# Patient Record
Sex: Male | Born: 1975 | Race: White | Hispanic: No | Marital: Married | Smoking: Former smoker
Health system: Southern US, Community
[De-identification: ages and names within clinical notes are randomized; demographics above are authoritative.]

## PROBLEM LIST (undated history)

## (undated) DIAGNOSIS — R55 Syncope and collapse: Secondary | ICD-10-CM

## (undated) DIAGNOSIS — R079 Chest pain, unspecified: Secondary | ICD-10-CM

## (undated) DIAGNOSIS — I10 Essential (primary) hypertension: Secondary | ICD-10-CM

## (undated) DIAGNOSIS — J189 Pneumonia, unspecified organism: Secondary | ICD-10-CM

## (undated) DIAGNOSIS — I469 Cardiac arrest, cause unspecified: Secondary | ICD-10-CM

## (undated) DIAGNOSIS — E669 Obesity, unspecified: Secondary | ICD-10-CM

## (undated) DIAGNOSIS — I499 Cardiac arrhythmia, unspecified: Secondary | ICD-10-CM

## (undated) DIAGNOSIS — E079 Disorder of thyroid, unspecified: Secondary | ICD-10-CM

## (undated) DIAGNOSIS — G4730 Sleep apnea, unspecified: Secondary | ICD-10-CM

## (undated) DIAGNOSIS — E785 Hyperlipidemia, unspecified: Secondary | ICD-10-CM

## (undated) DIAGNOSIS — S0300XA Dislocation of jaw, unspecified side, initial encounter: Secondary | ICD-10-CM

## (undated) HISTORY — DX: Syncope and collapse: R55

## (undated) HISTORY — PX: CARDIAC CATHETERIZATION: SHX172

## (undated) HISTORY — PX: HAND SURGERY: SHX662

## (undated) HISTORY — DX: Hyperlipidemia, unspecified: E78.5

## (undated) HISTORY — DX: Chest pain, unspecified: R07.9

## (undated) HISTORY — DX: Dislocation of jaw, unspecified side, initial encounter: S03.00XA

## (undated) HISTORY — DX: Obesity, unspecified: E66.9

---

## 1997-09-04 ENCOUNTER — Emergency Department (HOSPITAL_COMMUNITY): Admission: EM | Admit: 1997-09-04 | Discharge: 1997-09-04 | Payer: Self-pay | Admitting: *Deleted

## 2002-03-26 ENCOUNTER — Ambulatory Visit (HOSPITAL_COMMUNITY): Admission: RE | Admit: 2002-03-26 | Discharge: 2002-03-26 | Payer: Self-pay | Admitting: Internal Medicine

## 2002-03-26 ENCOUNTER — Encounter (INDEPENDENT_AMBULATORY_CARE_PROVIDER_SITE_OTHER): Payer: Self-pay | Admitting: Internal Medicine

## 2006-08-15 ENCOUNTER — Ambulatory Visit: Payer: Self-pay | Admitting: Cardiology

## 2006-09-12 ENCOUNTER — Ambulatory Visit: Payer: Self-pay | Admitting: Cardiology

## 2010-06-08 NOTE — Assessment & Plan Note (Signed)
[facility]                          [facility] OFFICE NOTE   Nathan [name], Nathan [name]                      MRN:          [id number]  DATE:09/12/2006                            DOB:          03-20-1975    REASON FOR CONSULTATION:  Hypertension and history of chest discomfort.   HISTORY OF PRESENT ILLNESS:  Nathan [name] is a 35 year old male who was  recently admitted to [facility] to the hospitalist  service (Dr.  Loretha Stapler) with a diagnosis of chest pain. It was felt that  this was potentially musculoskeletal in origin as the patient ruled out  for myocardial infarction and underwent an adenosine Cardiolite which  showed no evidence of ischemia with a normal ejection fraction of 59%.  His blood pressure was 130/100 on evaluation in the emergency department  and the patient does report for several years at least since 2001, he  has had a problem with fluctuating blood pressure. His history is  actually fairly difficult to piece together and somewhat convoluted. He  tells me that he had some type of chemical exposure back in 2001 and  had skin changes and an increase in his weight from 180 to 240 pounds.  He was seen by dermatologist reportedly in [place] around that  time. I have no other details in this regard. In any event, he states  that over the last several years, he has had problems with fluctuating  blood pressure and occasionally episodes of chest pain, which he  describes as a block of ice sitting on his chest. This is very  sporadic and has occurred three times over the last three years. These  episodes have happened typically in the late evening and early morning  hours. He has been managed with differing types of antihypertensive  medicines although reportedly only for limited periods of time. He used  to see Dr.  Dewaine Conger and also saw [facility] along  the way. Presently, he is not on any specific  antihypertensives. He  reports blood pressures spiking as high as 190/125. He reports being  seen in the past by cardiologist and having an echocardiogram a few  years ago, details are not available at this time.   In clinic today, he denies any active or recent chest pain. He reports a  fairly significant family history of premature cardiovascular disease.  Electrocardiogram today shows normal sinus rhythm. Recent blood work in  the hospital identified normal renal function with normal potassium,  normal cardiac markers, normal CBC. Triglycerides of 346, total  cholesterol of 229, HDL of 32 and an LDL of 128. TSH was normal at 3.19.  Chest x-ray described borderline cardiac size with no other acute  findings.   Today, we talked about the patient's general cardiovascular risk based  on his family history and apparent problems with hypertension and mild  hyperlipidemia. We talked about risk factor modification strategies  including weight loss and exercise. Mr. Poehlman notes that he has been  fatigued in general and that his sleep is not restful. He states that he  has been told that  he snores quite a bit and even has potential episodes  of apnea suggesting that he may have underlying obstructive sleep apnea.  He denies having any other evaluation for hypertension.   ALLERGIES:  BENZOCAINE.   CURRENT MEDICATIONS:  None.   PAST MEDICAL HISTORY:  Is as outlined above. He denies any documented  history of type 2 diabetes mellitus. Reports a previous suture of  laceration of right hand following injury in the past.   SOCIAL HISTORY:  The patient is married. 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 Denies any tobacco use. Denies any recreational drug use or  alcohol abuse.   FAMILY HISTORY:  Significant for premature cardiovascular disease.   REVIEW OF SYSTEMS:  As described in History of Present Illness.  Otherwise, negative.   PHYSICAL EXAMINATION:  Blood pressure 139/94, heart rate is  in the 70s.  Weight is 247 pounds. This is an overweight male in no acute distress  without active chest pain. Somewhat flat affect.  HEENT: Normal.  NECK: Supple, without elevated jugular venous pressure. No thyromegaly  or thyroid tenderness is noted.  LUNGS:  Are clear without labored breathing.  CARDIAC: Reveals a regular rate and rhythm with no loud systolic murmur,  pericardial rub or S3 gallop.  ABDOMEN: Obese, nontender, no hepatomegaly. No bruits are evident.  EXTREMITIES: Show no significant pitting edema.  SKIN: Warm and dry.  Distal pulses are 2+.  MUSCULOSKELETAL: No kyphosis is noted.  NEURO/PSYCHIATRIC: The patient is alert and oriented x3.   IMPRESSIONS AND RECOMMENDATIONS:  1. Mild hypertension based on today's examination with reported      history of blood pressure fluctuations as detailed above. His basic      blood work argues against any major adrenal disease. He has no      abdominal bruit or renal insufficiency to argue strongly for renal      artery stenosis. Given the fluctuation in his blood pressure, I      think it would be reasonable to screen with a 24 hour urine      collection for metanephrine's and normetanephrine's to exclude      pheochromocytoma, although this remains an unlikely possibility. I      suspect he may need a consistent medical therapy to get the best      handle on his blood pressure longterm, although I have also      recommended weight loss and a basic walking regimen to begin.  2. Mild hyperlipidemia as noted with recent blood work during hospital      stay. Based on the patient's family history of premature      cardiovascular disease, he may want to be fairly aggressive with      this. I did not start him on statin therapy, but this would be a      consideration, likely at low dose. We can discuss this further.  3. In light of his symptoms of potential obstructive sleep apnea, I      have also recommended a sleep study. This may  also be impacting his      blood pressure and potentially his feeling of fatigue and restless      sleep.  4. From the perspective of cardiac risk stratification, he had a very      reassuring Cardiolite just recently and ruled out for myocardial      infarction. I doubt that we need to consider any additional      ischemic evaluation  at this point. Certainly, if he has      progressive/recurrent symptoms, we could consider additional      evaluation, possibly with a cardiac CT scan. Invasive evaluation      via cardiac catheterization could also be considered. At this      point, he was not interested in any invasive evaluation. We will      plan to have him follow up with [place] over the next few weeks to      review the results of testing.     Jonelle Sidle, MD  Electronically Signed    SGM/MedQ  DD: 09/12/2006  DT: 09/12/2006  Job #: [id number]   cc:   Ernestine Conrad, MD

## 2010-06-30 ENCOUNTER — Emergency Department (HOSPITAL_COMMUNITY): Payer: BC Managed Care – PPO

## 2010-06-30 ENCOUNTER — Inpatient Hospital Stay (HOSPITAL_COMMUNITY)
Admission: EM | Admit: 2010-06-30 | Discharge: 2010-07-03 | DRG: 143 | Disposition: A | Discharge status: Home Health | Payer: BC Managed Care – PPO | Attending: Internal Medicine | Admitting: Internal Medicine

## 2010-06-30 DIAGNOSIS — R0789 Other chest pain: Principal | ICD-10-CM | POA: Diagnosis present

## 2010-06-30 DIAGNOSIS — F411 Generalized anxiety disorder: Secondary | ICD-10-CM | POA: Diagnosis present

## 2010-06-30 DIAGNOSIS — R030 Elevated blood-pressure reading, without diagnosis of hypertension: Secondary | ICD-10-CM | POA: Diagnosis present

## 2010-06-30 DIAGNOSIS — E119 Type 2 diabetes mellitus without complications: Secondary | ICD-10-CM | POA: Diagnosis present

## 2010-06-30 DIAGNOSIS — Z8249 Family history of ischemic heart disease and other diseases of the circulatory system: Secondary | ICD-10-CM

## 2010-06-30 DIAGNOSIS — E785 Hyperlipidemia, unspecified: Secondary | ICD-10-CM | POA: Diagnosis present

## 2010-06-30 DIAGNOSIS — R55 Syncope and collapse: Secondary | ICD-10-CM | POA: Diagnosis not present

## 2010-06-30 DIAGNOSIS — Z87891 Personal history of nicotine dependence: Secondary | ICD-10-CM

## 2010-06-30 DIAGNOSIS — E781 Pure hyperglyceridemia: Secondary | ICD-10-CM | POA: Diagnosis present

## 2010-06-30 LAB — CBC
HCT: 40.8 % (ref 39.0–52.0)
MCHC: 35 g/dL (ref 30.0–36.0)
RDW: 12.1 % (ref 11.5–15.5)

## 2010-06-30 LAB — GLUCOSE, CAPILLARY: Glucose-Capillary: 276 mg/dL — ABNORMAL HIGH (ref 70–99)

## 2010-06-30 LAB — CARDIAC PANEL(CRET KIN+CKTOT+MB+TROPI)
CK, MB: 1.2 ng/mL (ref 0.3–4.0)
Troponin I: <0.3 ng/mL (ref <0.30)

## 2010-06-30 LAB — D-DIMER, QUANTITATIVE: D-Dimer, Quant: 0.26 ug/mL-FEU (ref 0.00–0.48)

## 2010-06-30 LAB — DIFFERENTIAL
Basophils Absolute: 0 10*3/uL (ref 0.0–0.1)
Basophils Relative: 0 % (ref 0–1)
Eosinophils Absolute: 0.1 10*3/uL (ref 0.0–0.7)
Eosinophils Relative: 2 % (ref 0–5)
Lymphs Abs: 2.1 10*3/uL (ref 0.7–4.0)
Monocytes Absolute: 0.4 10*3/uL (ref 0.1–1.0)
Monocytes Relative: 7 % (ref 3–12)
Neutro Abs: 3.1 10*3/uL (ref 1.7–7.7)
Neutrophils Relative %: 54 % (ref 43–77)

## 2010-06-30 LAB — CK TOTAL AND CKMB (NOT AT ARMC)
CK, MB: 1.3 ng/mL (ref 0.3–4.0)
Relative Index: INVALID (ref 0.0–2.5)
Total CK: 82 U/L (ref 7–232)

## 2010-06-30 LAB — BASIC METABOLIC PANEL
BUN: 8 mg/dL (ref 6–23)
CO2: 28 mEq/L (ref 19–32)
Calcium: 9.9 mg/dL (ref 8.4–10.5)
Creatinine, Ser: 0.58 mg/dL (ref 0.4–1.5)
GFR calc Af Amer: >60 mL/min (ref >60)
GFR calc non Af Amer: >60 mL/min (ref >60)
Glucose, Bld: 263 mg/dL — ABNORMAL HIGH (ref 70–99)
Sodium: 135 mEq/L (ref 135–145)

## 2010-06-30 LAB — TROPONIN I: Troponin I: <0.3 ng/mL (ref <0.30)

## 2010-07-01 ENCOUNTER — Inpatient Hospital Stay (HOSPITAL_COMMUNITY): Payer: BC Managed Care – PPO

## 2010-07-01 DIAGNOSIS — I10 Essential (primary) hypertension: Secondary | ICD-10-CM

## 2010-07-01 DIAGNOSIS — R079 Chest pain, unspecified: Secondary | ICD-10-CM

## 2010-07-01 DIAGNOSIS — R072 Precordial pain: Secondary | ICD-10-CM

## 2010-07-01 LAB — DIFFERENTIAL
Basophils Absolute: 0 10*3/uL (ref 0.0–0.1)
Basophils Absolute: 0 10*3/uL (ref 0.0–0.1)
Basophils Relative: 0 % (ref 0–1)
Eosinophils Absolute: 0.1 10*3/uL (ref 0.0–0.7)
Lymphocytes Relative: 42 % (ref 12–46)
Lymphs Abs: 2.7 10*3/uL (ref 0.7–4.0)
Neutro Abs: 3.2 10*3/uL (ref 1.7–7.7)
Neutrophils Relative %: 50 % (ref 43–77)
Neutrophils Relative %: 47 % (ref 43–77)

## 2010-07-01 LAB — LIPID PANEL: Triglycerides: 440 mg/dL — ABNORMAL HIGH (ref <150)

## 2010-07-01 LAB — GLUCOSE, CAPILLARY: Glucose-Capillary: 292 mg/dL — ABNORMAL HIGH (ref 70–99)

## 2010-07-01 LAB — CARDIAC PANEL(CRET KIN+CKTOT+MB+TROPI)
CK, MB: 1.2 ng/mL (ref 0.3–4.0)
CK, MB: 1.2 ng/mL (ref 0.3–4.0)
Relative Index: INVALID (ref 0.0–2.5)
Relative Index: INVALID (ref 0.0–2.5)
Total CK: 58 U/L (ref 7–232)
Total CK: 61 U/L (ref 7–232)
Troponin I: <0.3 ng/mL (ref <0.30)

## 2010-07-01 LAB — BASIC METABOLIC PANEL
BUN: 8 mg/dL (ref 6–23)
CO2: 28 mEq/L (ref 19–32)
Chloride: 98 mEq/L (ref 96–112)
Creatinine, Ser: 0.58 mg/dL (ref 0.4–1.5)

## 2010-07-01 LAB — RAPID URINE DRUG SCREEN, HOSP PERFORMED
Amphetamines: NOT DETECTED
Barbiturates: NOT DETECTED
Benzodiazepines: NOT DETECTED
Opiates: POSITIVE — AB

## 2010-07-01 LAB — CBC
HCT: 41.1 % (ref 39.0–52.0)
Hemoglobin: 14.4 g/dL (ref 13.0–17.0)
MCV: 86.5 fL (ref 78.0–100.0)
Platelets: 264 10*3/uL (ref 150–400)
RBC: 4.82 MIL/uL (ref 4.22–5.81)
RBC: 4.75 MIL/uL (ref 4.22–5.81)
WBC: 6.3 10*3/uL (ref 4.0–10.5)
WBC: 6.5 10*3/uL (ref 4.0–10.5)

## 2010-07-01 LAB — TSH: TSH: 2.794 u[IU]/mL (ref 0.350–4.500)

## 2010-07-01 LAB — C-PEPTIDE: C-Peptide: 2.68 ng/mL (ref 0.80–3.90)
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
## 2010-07-01 NOTE — Consult Note (Signed)
NAMEGODFREY, TRITSCHLER NO.:  [id number]  MEDICAL RECORD NO.:  [id number]  LOCATION:  IC11                          FACILITY:  [facility]  PHYSICIAN:  Jonelle Sidle, MD DATE OF BIRTH:  [birth year]  DATE OF CONSULTATION: DATE OF DISCHARGE:                                CONSULTATION   PRIMARY CARE PHYSICIAN:  Dr. Ernestine Conrad.  REQUESTING PHYSICIAN:  Isidor Holts, MD, with the [facility].  REASON FOR CONSULTATION:  Chest pain and hypertension.  HISTORY OF PRESENT ILLNESS:  Mr. Weick is a 35 year old male with a fairly longstanding history of hypertension, possible hyperlipidemia, and previously documented atypical chest pain with evaluation in August 2008.  He was evaluated at [facility] around that time and underwent an adenosine Cardiolite demonstrating no evidence of ischemia with normal left ventricular systolic function.  It does not appear that he has been consistent with any regular medical regimen for his hypertension over the years.  He states that he has lost approximately 50 pounds via diet and exercise over the last 6-8 months, but feels generally fatigued, working as a Naval architect.  He is now admitted to the hospital following an episode of chest pain that occurred at rest.  He describes a fairly pleuritic discomfort in his chest, no associated cough or hemoptysis, and at this point not associated with any abnormal cardiac markers or electrocardiographic findings.  He has been treated with nitroglycerin and morphine, and still has intermittent symptoms.  D-dimer level was normal at 0.26 and his chest x-ray from June 6 showed no acute findings.  Blood pressure was elevated at presentation, although has settled down since admission. He was placed empirically on Lopressor by the hospitalist service, home medicines reportedly including PRN clonidine, possibly another medication that he had not gotten filled as yet.  When  seen back in 2008, the possibility of a 24-hour urine collection for metanephrines and normetanephrines was discussed, results are unfortunately not available to me at this time.  It does not appear that he has undergone any other formal assessment for secondary causes of hypertension, and it is interesting to note that his presenting potassium was low at 3.2, in the absence of diuretic therapy.  ALLERGIES:  BENZOCAINE.  MEDICATIONS AT HOME:  None consistently.  The patient states that he has clonidine 0.1 mg to be taken as needed when "blood pressure is high" also multivitamins once daily and p.r.n. ibuprofen.  Past medical history is noted above.  Also reportedly some type of chemical exposure or dermatitis back in 2001 that was managed by a dermatologist in [place].  Reported history of heart murmur in childhood.  Remote history of right hand laceration and suturing following injury.  No other major surgical procedures.  No reported history of diabetes mellitus.  FAMILY HISTORY:  Significant for premature cardiovascular disease including the patient's father, paternal grandfather, paternal grandmother, and paternal uncle.  The patient's mother has reported history of hypertension and diabetes as well as Cushing syndrome.  Other family history of diabetes is noted as well.  SOCIAL HISTORY:  The patient works as a Engineer, drilling.  He is married, has 1 child.  Denies any  alcohol use or illicit substance use. Does have an active tobacco use history of 5 cigarettes a day.  REVIEW OF SYSTEMS:  As detailed above.  No palpitations or syncope. Reports stable appetite.  No unusual bleeding problems.  Otherwise reviewed are negative.  PHYSICAL EXAMINATION:  VITAL SIGNS:  On examination, most recent vital signs show temperature of 97.6 degrees, heart rate in the 60s in sinus rhythm, blood pressure 109/53 to as high as 163/96 over the last 10 hours.  Respirations 18, oxygen  saturation is 96% on room air. GENERAL:  This is an obese male weighing 100 kg in no acute distress. HEENT:  Conjunctivae and lids are normal.  Oropharynx is clear with moist mucosa. NECK:  Supple.  No elevated JVP or carotid bruits.  No thyromegaly. LUNGS:  Clear without labored breathing at rest. CARDIAC:  Regular rate and rhythm, probable soft flow murmur, no pericardial rub. ABDOMEN:  Soft, nontender.  No unusual bruits.  No tenderness to palpation.  Bowel sounds present. EXTREMITIES:  Exhibit no frank pitting edema.  Distal pulses are full. SKIN:  Warm and dry. MUSCULOSKELETAL:  No kyphosis is noted. NEUROPSYCHIATRIC:  The patient is alert and oriented x3.  Affect seems grossly appropriate  LABORATORY DATA:  WBC is 5.7, hemoglobin 14.3, hematocrit 40.8, platelets 240, D-dimer 0.26.  Sodium 135, potassium 3.2, chloride 98, bicarb 28, glucose 273, BUN 8, creatinine 0.58, peak CK-MB 1.2, peak troponin-I less than 0.30. A 12-electrocardiogram from June 6 shows normal sinus rhythm with sinus arrhythmia, no acute ST-T changes.  IMPRESSION: 1. Atypical chest pain, pleuritic and fairly prolonged, associated     with normal ECG and normal cardiac markers.  The patient does have     significant risk factors for cardiovascular disease including     suboptimally controlled hypertension, newly-diagnosed diabetes     mellitus, possible history of hyperlipidemia, and significant     family history of premature cardiovascular disease.  Last ischemic     assessment was in 2008, reassuring at that time. 2. Longstanding history of hypertension, apparently not well     controlled over time, also complicated by noncompliance with     regular followup and medical therapy.  It is interesting to note low     potassium at presentation with normal renal function, and in the     absence of diuretic use.  This makes further workup of potential     secondary causes of hypertension, particularly  hyperaldosteronism,     to be important. 3. Significantly elevated random glucose, consistent with type 2     diabetes mellitus. 4. Obesity. 5. Tobacco abuse.  RECOMMENDATIONS:  Situation was discussed with the patient and his wife.  They are very concerned about his cardiac status in general. Plan is to proceed with followup 2-D echocardiogram to reassess cardiac structure and function, and an inpatient exercise Myoview.  His risk factors are substantial, but testing will likely provide reassurance overall. Lopressor will be discontinued in light of anticipated exercise, and also further workup for hyperaldosteronism.  A 24-hour urine collection will be started for metanephrines and normetanephrines to exclude the unlikely possibility of pheochromocytoma.  The patient does report "spikes" in his blood pressure at times however.  A plasma renin activity level as well as plasma aldosterone level will be obtained tomorrow.  The patient only received 2 doses of Lopressor and has not been on this or diuretic long-term.  Renal artery duplex imaging will be arranged.  Followup BMET will be obtained  as well as hemoglobin A1c.  If concern remains for the possibility of secondary hypertension, then he will need referral to an endocrinologist going forward.  We can make further recommendations regarding ischemic workup as more information is available.     Jonelle Sidle, MD     SGM/MEDQ  D:  07/01/2010  T:  07/01/2010  Job:  [id number]  cc:   Sallyanne Havers, M.D.  Electronically Signed by Nona Dell MD on 07/01/2010 05:05:42 PM

## 2010-07-02 ENCOUNTER — Inpatient Hospital Stay (HOSPITAL_COMMUNITY): Payer: BC Managed Care – PPO

## 2010-07-02 ENCOUNTER — Encounter (HOSPITAL_COMMUNITY): Payer: Self-pay | Admitting: Radiology

## 2010-07-02 DIAGNOSIS — R079 Chest pain, unspecified: Secondary | ICD-10-CM

## 2010-07-02 LAB — GLUCOSE, CAPILLARY
Glucose-Capillary: 203 mg/dL — ABNORMAL HIGH (ref 70–99)
Glucose-Capillary: 211 mg/dL — ABNORMAL HIGH (ref 70–99)
Glucose-Capillary: 175 mg/dL — ABNORMAL HIGH (ref 70–99)

## 2010-07-02 LAB — DIFFERENTIAL
Basophils Absolute: 0 10*3/uL (ref 0.0–0.1)
Lymphocytes Relative: 33 % (ref 12–46)
Neutro Abs: 4.4 10*3/uL (ref 1.7–7.7)
Neutrophils Relative %: 56 % (ref 43–77)

## 2010-07-02 LAB — CARDIAC PANEL(CRET KIN+CKTOT+MB+TROPI)
CK, MB: 1 ng/mL (ref 0.3–4.0)
Total CK: 54 U/L (ref 7–232)
Total CK: 45 U/L (ref 7–232)

## 2010-07-02 LAB — HEMOGLOBIN A1C: Mean Plasma Glucose: 301 mg/dL — ABNORMAL HIGH (ref <117)

## 2010-07-02 LAB — BASIC METABOLIC PANEL
CO2: 30 mEq/L (ref 19–32)
GFR calc non Af Amer: >60 mL/min (ref >60)
Glucose, Bld: 179 mg/dL — ABNORMAL HIGH (ref 70–99)
Potassium: 3.5 mEq/L (ref 3.5–5.1)
Sodium: 140 mEq/L (ref 135–145)

## 2010-07-02 LAB — CBC
HCT: 45.3 % (ref 39.0–52.0)
Hemoglobin: 15.7 g/dL (ref 13.0–17.0)
RDW: 12.5 % (ref 11.5–15.5)
WBC: 7.8 10*3/uL (ref 4.0–10.5)

## 2010-07-02 MED ORDER — TECHNETIUM TC 99M TETROFOSMIN IV KIT
10.0000 | PACK | Freq: Once | INTRAVENOUS | Status: AC | PRN
  Administered 2010-07-02: 10.96 via INTRAVENOUS

## 2010-07-02 MED ORDER — TECHNETIUM TC 99M TETROFOSMIN IV KIT
30.0000 | PACK | Freq: Once | INTRAVENOUS | Status: AC | PRN
  Administered 2010-07-02: 28.4 via INTRAVENOUS

## 2010-07-02 MED ORDER — IOHEXOL 350 MG/ML SOLN
100.0000 mL | Freq: Once | INTRAVENOUS | Status: AC | PRN
  Administered 2010-07-02: 100 mL via INTRAVENOUS

## 2010-07-03 LAB — BASIC METABOLIC PANEL
Calcium: 10.2 mg/dL (ref 8.4–10.5)
GFR calc Af Amer: >60 mL/min (ref >60)
GFR calc non Af Amer: >60 mL/min (ref >60)
Potassium: 3.4 mEq/L — ABNORMAL LOW (ref 3.5–5.1)
Sodium: 135 mEq/L (ref 135–145)

## 2010-07-03 LAB — GLUCOSE, CAPILLARY: Glucose-Capillary: 204 mg/dL — ABNORMAL HIGH (ref 70–99)

## 2010-07-03 NOTE — Discharge Summary (Signed)
Nathan [name], Nathan [name] NO.:  [id number]  MEDICAL RECORD NO.:  [id number]  LOCATION:  IC11                          FACILITY:  [facility]  PHYSICIAN:  Isidor Holts, M.D.  DATE OF BIRTH:  1975/07/16  DATE OF ADMISSION:  06/30/2010 DATE OF DISCHARGE:  LH                              DISCHARGE SUMMARY   PRIMARY MD:  [facility].  DISCHARGE DIAGNOSES: 1. Atypical chest pain, status post negative stress Myoview on July 02, 2010. 2. Smoking history. 3. Borderline hypertension. 4. Anxiety. 5. Dyslipidemia/hypertriglyceridemia. 6. Newly-diagnosed diabetes mellitus. 7. Syncopal episodes likely vasovagal on July 01, 2010.  DISCHARGE MEDICATIONS: 1. Aspirin 81 mg OTC 1 p.o. daily. 2. Fenofibrate 160 mg p.o. daily. 3. NovoLog insulin via pen, sliding scale for CBG 70-120; no insulin,     CBG 121-150; 2 units, CBG 151-200; 3 units, CBG 201-250; 5 units,     CBG 251-300; 8 units, CBG 301-350; 11 units, and CBG 351-400; 15     units. 4. Lantus insulin 15 units subcutaneously via pen daily. 5. Zocor 20 mg p.o. daily. 6. Ibuprofen 400 mg p.o. p.r.n. q.6 h. for pain.  Note:  Clonidine has been discontinued.  PROCEDURES: 1. Chest x-ray on June 30, 2010, this showed no acute findings. 2. Abdominal CT angiogram on July 02, 2010, this was a normal study. 3. Stress Myoview on July 02, 2010, showing negative study. 4. A 2-D echocardiogram on July 01, 2010, this showed normal left     ventricular cavity size, wall thickness was normal, systolic     function was normal with an estimated ejection fraction of 55% to     60%.  Wall motion was normal.  Left atrium was at the upper limits     of normal size.  There was trivial tricuspid regurgitation.  CONSULTATIONS:  Dr. Nona Dell, cardiologist.  ADMISSION HISTORY:  As in H and P notes of June 30, 2010, dictated by this MD. However, in brief, this is a 35 year old male,  with known history of labile hypertension,  dyslipidemia, history of chemical exposure/dermatitis in 2001 treated by dermatologist at [facility]. History of atypical chest pain in August 2008, status post negative adenosine Cardiolite stress testing at the time.  Status post suture of right hand laceration remotely.  Recent smoking history of approximately 2 months.  History of heart murmur in childhood, presenting with a few hours of chest pain which occurred while the patient was talking with his wife on the phone at about 2-3 p.m. on day of presentation, while driving along.  His left the arm went numb, he had nausea and diaphoresis, he pulled over to side of the road and his spouse called EMS. The patient was subsequently brought to the emergency department. En route, he had received 4 aspirin tablets and 2 sublingual nitroglycerin sprays, with some alleviation of chest discomfort.  He was admitted for further evaluation, investigation, and management.  CLINICAL COURSE: 1. Chest pain.  The patient presented as described above. Although     there were some atypical features, it was felt that he did have a     multiplicity of coronary artery disease risk  factor, given a     strongly positive family history of early onset of coronary artery     disease, hypertension, and known history of dyslipidemia.  He was     placed on telemetric monitoring.  Cardiac enzymes were cycled,     remained unelevated.  D-dimer was negative at 0.26.  Chest x-ray     was unremarkable for acute pathology.  A 12-lead EKG showed no     acute ischemic changes.  A 2-D echocardiogram showed no regional     wall motion abnormalities.  Cardiology consultation was kindly     provided by Dr. Nona Dell.  The patient underwent a stress     Myoview on July 02, 2010, and this was a negative study.  He has     been reassured accordingly.  2. Smoking history.  The patient started smoking approximately 2     months ago.  He has been counseled appropriately  and strongly urged     to quit.  He appears agreeable.  3. Hypertension.  The patient has a history of somewhat labile     hypertension.  According to him, he has been intermittently     complaint with his preadmission antihypertensive medications     because blood pressure remained normal for long periods     of time.  Given his young age, it was felt that the basic workup     for secondary course of hypertension, should be instituted.  He     underwent CT angiogram of the abdomen on July 02, 2010 to rule out     renal artery stenosis.  This was a negative study.  The patient was     reassured accordingly.  Also serum and urinary metanephrines as     well as angiotensin and aldosterone levels were ordered.  Unfortunately,     at the time of this dictation, the report was still pending.     Be that as it may, throughout the course of this hospitalization, the     patient remained normotensive, off antihypertensive medications.     Because systolic blood pressure was borderline at 105 on July 03, 2010, it was felt not appropriate to commence him on a low-dose     ACE inhibitor, even against his background of newly diagnosed diabetes     mellitus.  4. Anxiety.  The patient did appear somewhat anxious during the course     of this hospitalization.  Otherwise, his mood remained stable.  5. Dyslipidemia.  The patient has a known history of dyslipidemia.     His lipid profile as follows, total cholesterol 218, triglyceride     440, HDL 30, LDL not calculated. He has been commenced on a     combination of fenofibrate and statin.  6. Newly-diagnosed diabetes mellitus.  The patient at presentation, had     a random blood glucose of 263.  His subsequent hemoglobin A1c was     markedly elevated at 12.1, consistent with diabetes mellitus.  He     was plaved on carbohydrate modified diet, sliding-scale insulin coverage     as well as scheduled Lantus insulin, underwent diabetic teaching.      Arrangements have been put in place, for him to attend     outpatient diabetic class, and have a home health RN for diabetes     management.  Otherwise, we defer further management of his diabetes     mellitus  and titration of his diabetic medications, to his primary     MD on followup.  7. Syncopal episode.  The patient en route to the Radiology Department on     July 01, 2010, had a syncopal episode, which occurred just after     he had been placed in his wheelchair. At that time, his heart rate     was found to be in the low 40s, although he did have a normal     blood pressure 129/80.  At the time, he was on low-dose beta     blocker, which has been started at the time of admission in the     emergency department. This was discontinued.  He had no recurrences     thereafter.  Likely this was a vasovagal episode, although beta-     blocker may have been contributory.  DISPOSITION:  The patient was on July 02, 2010, considered clinically stable for discharge.  There were no new issues.  He was asymptomatic. He was therefore discharged accordingly.  ACTIVITY:  As tolerated.  DIET:  Heart-healthy/carbohydrate modified.  FOLLOWUP INSTRUCTIONS:  The patient is to follow up with his primary MD at [facility] [place] in the coming week.  He is instructed to call for an appointment.  In addition, the patient is to attend outpatient diabetic classes.  SPECIAL INSTRUCTIONS:  Home health RN for diabetes management has been arranged.    The patient is recommended to return to regular duties on July 10, 2010.     Isidor Holts, M.D.     CO/MEDQ  D:  07/03/2010  T:  07/03/2010  Job:  [id number]  cc:   [facility]  Electronically Signed by Isidor Holts M.D. on 07/03/2010 06:58:19 PM

## 2010-07-09 LAB — METANEPHRINES, URINE, 24 HOUR
Metaneph Total, Ur: 582 mcg/24 h (ref 115–695)
Normetanephrine, 24H Ur: 404 mcg/24 h (ref 35–482)

## 2010-07-11 LAB — ALDOSTERONE + RENIN ACTIVITY W/ RATIO
ALDO / PRA Ratio: 3 Ratio (ref 0.9–28.9)
Aldosterone: 4 ng/dL
PRA LC/MS/MS: 1.33 ng/mL/h (ref 0.25–5.82)

## 2010-08-05 ENCOUNTER — Encounter (HOSPITAL_COMMUNITY): Payer: Self-pay | Admitting: Cardiology

## 2011-08-03 ENCOUNTER — Emergency Department (HOSPITAL_COMMUNITY)
Admission: EM | Admit: 2011-08-03 | Discharge: 2011-08-03 | Discharge status: Against Medical Advice | Payer: BC Managed Care – PPO | Attending: Emergency Medicine | Admitting: Emergency Medicine

## 2011-08-03 ENCOUNTER — Emergency Department (HOSPITAL_COMMUNITY): Payer: BC Managed Care – PPO

## 2011-08-03 ENCOUNTER — Encounter (HOSPITAL_COMMUNITY): Payer: Self-pay

## 2011-08-03 DIAGNOSIS — R0989 Other specified symptoms and signs involving the circulatory and respiratory systems: Secondary | ICD-10-CM | POA: Insufficient documentation

## 2011-08-03 DIAGNOSIS — R0609 Other forms of dyspnea: Secondary | ICD-10-CM | POA: Insufficient documentation

## 2011-08-03 NOTE — ED Notes (Signed)
Pt sts that he may have pna, he feels a rattle in his chest.

## 2011-09-13 ENCOUNTER — Other Ambulatory Visit: Payer: Self-pay

## 2011-09-13 ENCOUNTER — Encounter (HOSPITAL_COMMUNITY): Payer: Self-pay | Admitting: *Deleted

## 2011-09-13 ENCOUNTER — Emergency Department (HOSPITAL_COMMUNITY): Payer: BC Managed Care – PPO

## 2011-09-13 ENCOUNTER — Emergency Department (HOSPITAL_COMMUNITY)
Admission: EM | Admit: 2011-09-13 | Discharge: 2011-09-13 | Disposition: A | Discharge status: Home / Self Care | Payer: BC Managed Care – PPO | Attending: Emergency Medicine | Admitting: Emergency Medicine

## 2011-09-13 DIAGNOSIS — E119 Type 2 diabetes mellitus without complications: Secondary | ICD-10-CM | POA: Insufficient documentation

## 2011-09-13 DIAGNOSIS — I10 Essential (primary) hypertension: Secondary | ICD-10-CM | POA: Insufficient documentation

## 2011-09-13 DIAGNOSIS — R0789 Other chest pain: Secondary | ICD-10-CM | POA: Insufficient documentation

## 2011-09-13 HISTORY — DX: Pneumonia, unspecified organism: J18.9

## 2011-09-13 HISTORY — DX: Essential (primary) hypertension: I10

## 2011-09-13 HISTORY — DX: Cardiac arrest, cause unspecified: I46.9

## 2011-09-13 LAB — CBC WITH DIFFERENTIAL/PLATELET
Basophils Absolute: 0 10*3/uL (ref 0.0–0.1)
Basophils Relative: 0 % (ref 0–1)
HCT: 39.7 % (ref 39.0–52.0)
MCHC: 34.8 g/dL (ref 30.0–36.0)
Monocytes Absolute: 0.7 10*3/uL (ref 0.1–1.0)
Neutro Abs: 4.3 10*3/uL (ref 1.7–7.7)
Neutrophils Relative %: 59 % (ref 43–77)
Platelets: 269 10*3/uL (ref 150–400)
RDW: 12.2 % (ref 11.5–15.5)

## 2011-09-13 LAB — BASIC METABOLIC PANEL
Calcium: 10.2 mg/dL (ref 8.4–10.5)
GFR calc Af Amer: >90 mL/min (ref >90)
GFR calc non Af Amer: >90 mL/min (ref >90)
Sodium: 136 mEq/L (ref 135–145)

## 2011-09-13 LAB — TROPONIN I: Troponin I: <0.3 ng/mL (ref <0.30)

## 2011-09-13 MED ORDER — ALBUTEROL SULFATE HFA 108 (90 BASE) MCG/ACT IN AERS
2.0000 | INHALATION_SPRAY | RESPIRATORY_TRACT | Status: DC | PRN
  Filled 2011-09-13: qty 6.7

## 2011-09-13 NOTE — ED Notes (Signed)
Sob, chest pain, cough, dizzy .

## 2011-09-13 NOTE — ED Provider Notes (Signed)
History  This chart was scribed for Doug Sou, MD by Bennett Scrape. This patient was seen in room [facility] and the patient's care was started at 6:43PM.  CSN: [id number]  Arrival date & time 09/13/11  1818   First MD Initiated Contact with Patient 09/13/11 1843      Chief Complaint  Patient presents with  . Shortness of Breath    Patient is a 36 y.o. male presenting with shortness of breath. The history is provided by the patient. No language interpreter was used.  Shortness of Breath  The current episode started 2 days ago. The onset was gradual. The problem has been gradually worsening. The problem is mild. The symptoms are relieved by humidity and cold air. Associated symptoms include chest pain, cough and shortness of breath. Pertinent negatives include no fever and no wheezing. His past medical history does not include asthma.    Nathan [name] is a 36 y.o. male who presents to the Emergency Department complaining of 2 days of gradual onset, gradually worsening, constant CP described as tightness with associated productive cough and SOB. No fever pain is pleuritic in nature at anterior chest, nonradiating He rates his pain a 5/10 currently and reports that the pain is a mild discomfort. He denies being SOB currently. He reports that humidity and cold air makes the symptoms worse. He denies taking OTC medications at home to improve symptoms.  He states that he experienced one prior episode about one month ago results spontaneously. He also reports one episode of dizziness and chills that lasted about 45 minutes today. He denies fever, diaphoresis, nausea and emesis as associated symptoms. He reports that he has a h/o of early MI in his family but denies having a h/o heart related conditions. He has a h/o DM (CBG level at 176 last night) and HTN and states that he takes glimepiride and metformin daily. He denies smoking, admits to rare alcohol. States he drank alcohol last night.  Denies illegal drug use.   PCP is with [facility].  Cardiac risk factors: Family history, former smoker quit one year ago, hyper tension Past Medical History  Diagnosis Date  . Pneumonia   . Diabetes mellitus   . Hypertension   . Cardiac arrest     History reviewed. No pertinent past surgical history.  Family History  Problem Relation Age of Onset  . Heart failure Mother   Father had a MI at 18, 40, 25 and 63.  History  Substance Use Topics  . Smoking status: Never Smoker   . Smokeless tobacco: Not on file  . Alcohol Use: No   Former smoker quit one year ago, rare alcohol, no illicit drug use   Review of Systems  Constitutional: Positive for chills. Negative for fever.  HENT: Negative.   Respiratory: Positive for cough and shortness of breath. Negative for wheezing.   Cardiovascular: Positive for chest pain. Negative for palpitations.  Gastrointestinal: Negative.   Musculoskeletal: Negative.   Skin: Negative.   Neurological: Positive for dizziness. Negative for light-headedness.  Hematological: Negative.   Psychiatric/Behavioral: Negative.   All other systems reviewed and are negative.    Allergies  Benzocaine  Home Medications  No current outpatient prescriptions on file.  Triage Vitals: BP 146/95  Pulse 84  Temp 97.5 F (36.4 C) (Oral)  Resp 20  Ht 5\' 11"  (1.803 m)  Wt 255 lb (115.667 kg)  BMI 35.57 kg/m2  SpO2 100%  Physical Exam  Nursing note and vitals reviewed. Constitutional:  He appears well-developed and well-nourished.  HENT:  Head: Normocephalic and atraumatic.  Eyes: Conjunctivae are normal. Pupils are equal, round, and reactive to light.  Neck: Neck supple. No tracheal deviation present. No thyromegaly present.  Cardiovascular: Normal rate and regular rhythm.   No murmur heard. Pulmonary/Chest: Effort normal and breath sounds normal.  Abdominal: Soft. Bowel sounds are normal. He exhibits no distension. There is no tenderness.    Musculoskeletal: Normal range of motion. He exhibits no edema and no tenderness.  Neurological: He is alert. Coordination normal.  Skin: Skin is warm and dry. No rash noted.  Psychiatric: He has a normal mood and affect.    ED Course  Procedures (including critical care time)  DIAGNOSTIC STUDIES: Oxygen Saturation is 100% on room air, normal by my interpretation.    Date: 09/13/2011  Rate: 80  Rhythm: normal sinus rhythm  QRS Axis: normal  Intervals: normal  ST/T Wave abnormalities: nonspecific T wave changes  Conduction Disutrbances:none  Narrative Interpretation:   Old EKG Reviewed: unchanged no significant change from 07/01/2010 interpreted by me  COORDINATION OF CARE: 6:52PM-Discussed treatment plan which includes blood work with pt at bedside and pt agreed to plan.  Results for orders placed during the hospital encounter of 09/13/11  CBC WITH DIFFERENTIAL      Component Value Range   WBC 7.5  4.0 - 10.5 K/uL   RBC 4.55  4.22 - 5.81 MIL/uL   Hemoglobin 13.8  13.0 - 17.0 g/dL   HCT 16.1  09.6 - 04.5 %   MCV 87.3  78.0 - 100.0 fL   MCH 30.3  26.0 - 34.0 pg   MCHC 34.8  30.0 - 36.0 g/dL   RDW 40.9  81.1 - 91.4 %   Platelets 269  150 - 400 K/uL   Neutrophils Relative 59  43 - 77 %   Neutro Abs 4.3  1.7 - 7.7 K/uL   Lymphocytes Relative 30  12 - 46 %   Lymphs Abs 2.3  0.7 - 4.0 K/uL   Monocytes Relative 9  3 - 12 %   Monocytes Absolute 0.7  0.1 - 1.0 K/uL   Eosinophils Relative 2  0 - 5 %   Eosinophils Absolute 0.2  0.0 - 0.7 K/uL   Basophils Relative 0  0 - 1 %   Basophils Absolute 0.0  0.0 - 0.1 K/uL  BASIC METABOLIC PANEL      Component Value Range   Sodium 136  135 - 145 mEq/L   Potassium 3.8  3.5 - 5.1 mEq/L   Chloride 97  96 - 112 mEq/L   CO2 29  19 - 32 mEq/L   Glucose, Bld 169 (*) 70 - 99 mg/dL   BUN 8  6 - 23 mg/dL   Creatinine, Ser 7.82  0.50 - 1.35 mg/dL   Calcium 95.6  8.4 - 21.3 mg/dL   GFR calc non Af Amer >90  >90 mL/min   GFR calc Af Amer >90   >90 mL/min  TROPONIN I      Component Value Range   Troponin I <0.30  <0.30 ng/mL   Dg Chest 2 View  09/13/2011  *RADIOLOGY REPORT*  Clinical Data: 36 year old male with left chest pain and congestion.  CHEST - 2 VIEW  Comparison: 08/03/2011 and earlier.  Findings: Stable lung volumes.  Cardiac size and mediastinal contours are within normal limits.  Visualized tracheal air column is within normal limits.  The lungs are clear.  No pneumothorax or  effusion. No acute osseous abnormality identified.  IMPRESSION: No acute cardiopulmonary abnormality.   Original Report Authenticated By: Harley Hallmark, M.D.    Results for orders placed during the hospital encounter of 09/13/11  CBC WITH DIFFERENTIAL      Component Value Range   WBC 7.5  4.0 - 10.5 K/uL   RBC 4.55  4.22 - 5.81 MIL/uL   Hemoglobin 13.8  13.0 - 17.0 g/dL   HCT 16.1  09.6 - 04.5 %   MCV 87.3  78.0 - 100.0 fL   MCH 30.3  26.0 - 34.0 pg   MCHC 34.8  30.0 - 36.0 g/dL   RDW 40.9  81.1 - 91.4 %   Platelets 269  150 - 400 K/uL   Neutrophils Relative 59  43 - 77 %   Neutro Abs 4.3  1.7 - 7.7 K/uL   Lymphocytes Relative 30  12 - 46 %   Lymphs Abs 2.3  0.7 - 4.0 K/uL   Monocytes Relative 9  3 - 12 %   Monocytes Absolute 0.7  0.1 - 1.0 K/uL   Eosinophils Relative 2  0 - 5 %   Eosinophils Absolute 0.2  0.0 - 0.7 K/uL   Basophils Relative 0  0 - 1 %   Basophils Absolute 0.0  0.0 - 0.1 K/uL  BASIC METABOLIC PANEL      Component Value Range   Sodium 136  135 - 145 mEq/L   Potassium 3.8  3.5 - 5.1 mEq/L   Chloride 97  96 - 112 mEq/L   CO2 29  19 - 32 mEq/L   Glucose, Bld 169 (*) 70 - 99 mg/dL   BUN 8  6 - 23 mg/dL   Creatinine, Ser 7.82  0.50 - 1.35 mg/dL   Calcium 95.6  8.4 - 21.3 mg/dL   GFR calc non Af Amer >90  >90 mL/min   GFR calc Af Amer >90  >90 mL/min  TROPONIN I      Component Value Range   Troponin I <0.30  <0.30 ng/mL   Dg Chest 2 View  09/13/2011  *RADIOLOGY REPORT*  Clinical Data: 36 year old male with left chest  pain and congestion.  CHEST - 2 VIEW  Comparison: 08/03/2011 and earlier.  Findings: Stable lung volumes.  Cardiac size and mediastinal contours are within normal limits.  Visualized tracheal air column is within normal limits.  The lungs are clear.  No pneumothorax or effusion. No acute osseous abnormality identified.  IMPRESSION: No acute cardiopulmonary abnormality.   Original Report Authenticated By: Harley Hallmark, M.D.      No diagnosis found.  X-ray reviewed by me 9:30 PM patient resting comfortably. Speaks in paragraphs no respiratory distress MDM  Strongly doubt acute coronary syndrome i.e. highly atypical symptoms cough pain is pleuritic. Nonacute EKG negative troponin after 2 days of symptoms Symptoms most consistent with bronchitis Plan albuterol inhaler to go to use 2 puffs every 4 hours when necessary cough or shortness of breath. Keep scheduled appointment with PMD next week Diagnosis atypical chest pain      I personally performed the services described in this documentation, which was scribed in my presence. The recorded information has been reviewed and considered.    Doug Sou, MD 09/13/11 2140

## 2012-08-07 DIAGNOSIS — R0789 Other chest pain: Secondary | ICD-10-CM

## 2012-08-08 DIAGNOSIS — R079 Chest pain, unspecified: Secondary | ICD-10-CM

## 2012-08-09 ENCOUNTER — Encounter: Payer: Self-pay | Admitting: Cardiology

## 2012-08-10 ENCOUNTER — Encounter: Payer: Self-pay | Admitting: *Deleted

## 2012-08-10 ENCOUNTER — Encounter: Payer: Self-pay | Admitting: Physician Assistant

## 2012-08-10 ENCOUNTER — Ambulatory Visit (INDEPENDENT_AMBULATORY_CARE_PROVIDER_SITE_OTHER): Payer: BC Managed Care – PPO | Admitting: Physician Assistant

## 2012-08-10 VITALS — BP 131/84 | HR 80 | Ht 71.0 in | Wt 233.0 lb

## 2012-08-10 DIAGNOSIS — Z01810 Encounter for preprocedural cardiovascular examination: Secondary | ICD-10-CM

## 2012-08-10 DIAGNOSIS — I10 Essential (primary) hypertension: Secondary | ICD-10-CM | POA: Insufficient documentation

## 2012-08-10 DIAGNOSIS — E785 Hyperlipidemia, unspecified: Secondary | ICD-10-CM | POA: Insufficient documentation

## 2012-08-10 DIAGNOSIS — R079 Chest pain, unspecified: Secondary | ICD-10-CM | POA: Insufficient documentation

## 2012-08-10 DIAGNOSIS — R072 Precordial pain: Secondary | ICD-10-CM

## 2012-08-10 LAB — PROTIME-INR

## 2012-08-10 NOTE — Assessment & Plan Note (Signed)
Recommendation is to proceed with elective diagnostic coronary angiography, as recently outlined, for definitive exclusion of significant CAD. Both the patient and his wife are in agreement with this plan, and the risks/benefits were discussed. Patient presents with no documented CAD, but with numerous CRFs, including DM and strong family history, and with history of prior negative stress tests, most recently in June 2012. He remains in clinically stable condition, having recently ruled out for MI with NL troponins. He had an echocardiogram which was normal. Moreover, his symptoms are essentially unchanged, since his recent hospitalization. Nevertheless, we will arrange to have this done early next week. He has requested to refrain from returning to work in the interim, with which we will oblige. This plan was reviewed with, and approved by, Dr. Diona Browner.

## 2012-08-10 NOTE — Patient Instructions (Signed)
Continue all current medications. No work until after cath Left heart cath - see info sheet given Your physician has requested that you have a cardiac catheterization. Cardiac catheterization is used to diagnose and/or treat various heart conditions. Doctors may recommend this procedure for a number of different reasons. The most common reason is to evaluate chest pain. Chest pain can be a symptom of coronary artery disease (CAD), and cardiac catheterization can show whether plaque is narrowing or blocking your heart's arteries. This procedure is also used to evaluate the valves, as well as measure the blood flow and oxygen levels in different parts of your heart. For further information please visit [email or web address] Please follow instruction sheet, as given. Follow up after procedure above

## 2012-08-10 NOTE — Progress Notes (Signed)
Primary Cardiologist: Prentice Docker, MD   HPI: Post hospital followup from [facility], following recent presentation with atypical CP. Troponins NL. He presents today to establish with our office, and to arrange for an elective cardiac catheterization, as recommended.  Patient presented with no previously documented CAD, but with numerous CRFs notable for DM, HTN, HLD, history of tobacco, and significant FH. He also had prior negative stress tests, most recently in June 2012. We recommended an in-house echocardiogram and, if normal, to consider further evaluation as an outpatient with elective cardiac catheterization.   - Echocardiogram, July 16: EF 55-60%; no focal WMAs; no significant valvular disease  He presents today reporting no significant change from his baseline, having been discharged from the hospital just 2 days ago. His wife, however, remains quite concerned that her husband develops significant diaphoresis and dyspnea with minimal exertion, such as walking. He, himself, suggests some worsening of his CP with walking, but which essentially has remained constant since his recent hospitalization.  Allergies  Allergen Reactions  . Benzocaine Anaphylaxis and Other (See Comments)    REACTION: Blistering all over and throat swells shut    Current Outpatient Prescriptions  Medication Sig Dispense Refill  . aspirin EC 81 MG tablet Take 81 mg by mouth every morning.      Marland Kitchen atorvastatin (LIPITOR) 10 MG tablet Take 10 mg by mouth daily.      Marland Kitchen gemfibrozil (LOPID) 600 MG tablet Take 600 mg by mouth 2 (two) times daily before a meal.      . lisinopril-hydrochlorothiazide (PRINZIDE,ZESTORETIC) 20-12.5 MG per tablet Take 1 tablet by mouth daily.      . metFORMIN (GLUCOPHAGE) 1000 MG tablet Take 1,000 mg by mouth 2 (two) times daily with a meal.      . saxagliptin HCl (ONGLYZA) 5 MG TABS tablet Take 5 mg by mouth daily.       No current facility-administered medications for this visit.    Past  Medical History  Diagnosis Date  . Pneumonia   . Diabetes mellitus   . Hypertension   . Cardiac arrest   . HLD (hyperlipidemia)   . TMJ (dislocation of temporomandibular joint)   . Syncope   . Obesity   . Chest pain     No past surgical history on file.  History   Social History  . Marital Status: Married    Spouse Name: N/A    Number of Children: N/A  . Years of Education: N/A   Occupational History  . Not on file.   Social History Main Topics  . Smoking status: Former Smoker -- 6 years    Quit date: 01/25/2008  . Smokeless tobacco: Not on file  . Alcohol Use: Yes     Comment: 3-4 beers weekly  . Drug Use: No  . Sexually Active: Not on file   Other Topics Concern  . Not on file   Social History Narrative  . No narrative on file    Family History  Problem Relation Age of Onset  . Heart failure Mother   . Heart attack Father     x5     ROS: no nausea, vomiting; no fever, chills; no melena, hematochezia; no claudication  PHYSICAL EXAM: BP 131/84  Pulse 80  Ht 5\' 11"  (1.803 m)  Wt 233 lb (105.688 kg)  BMI 32.51 kg/m2 GENERAL: 37 year-old male, obese; NAD HEENT: NCAT, PERRLA, EOMI; sclera clear; no xanthelasma NECK: palpable bilateral carotid pulses, no bruits; no JVD; no TM LUNGS:  CTA bilaterally CARDIAC: RRR (S1, S2); no significant murmurs; no rubs or gallops ABDOMEN: soft, non-tender; intact BS EXTREMETIES: Palpable femoral pulses without bruits; palpable dorsalis pedis pulses; no significant peripheral edema SKIN: warm/dry; no obvious rash/lesions MUSCULOSKELETAL: no joint deformity NEURO: no focal deficit; NL affect   EKG: reviewed and available in Electronic Records   ASSESSMENT & PLAN:  Chest pain Recommendation is to proceed with elective diagnostic coronary angiography, as recently outlined, for definitive exclusion of significant CAD. Both the patient and his wife are in agreement with this plan, and the risks/benefits were discussed.  Patient presents with no documented CAD, but with numerous CRFs, including DM and strong family history, and with history of prior negative stress tests, most recently in June 2012. He remains in clinically stable condition, having recently ruled out for MI with NL troponins. He had an echocardiogram which was normal. Moreover, his symptoms are essentially unchanged, since his recent hospitalization. Nevertheless, we will arrange to have this done early next week. He has requested to refrain from returning to work in the interim, with which we will oblige. This plan was reviewed with, and approved by, Dr. Diona Browner.    Gene Harace Mccluney, PAC

## 2012-08-13 ENCOUNTER — Other Ambulatory Visit: Payer: Self-pay | Admitting: Physician Assistant

## 2012-08-13 DIAGNOSIS — R079 Chest pain, unspecified: Secondary | ICD-10-CM

## 2012-08-14 ENCOUNTER — Encounter (HOSPITAL_BASED_OUTPATIENT_CLINIC_OR_DEPARTMENT_OTHER)
Admission: RE | Disposition: A | Discharge status: Home / Self Care | Payer: Self-pay | Source: Ambulatory Visit | Attending: Cardiology

## 2012-08-14 ENCOUNTER — Inpatient Hospital Stay (HOSPITAL_BASED_OUTPATIENT_CLINIC_OR_DEPARTMENT_OTHER)
Admission: RE | Admit: 2012-08-14 | Discharge: 2012-08-14 | Disposition: A | Discharge status: Home / Self Care | Payer: BC Managed Care – PPO | Source: Ambulatory Visit | Attending: Cardiology | Admitting: Cardiology

## 2012-08-14 DIAGNOSIS — R61 Generalized hyperhidrosis: Secondary | ICD-10-CM | POA: Insufficient documentation

## 2012-08-14 DIAGNOSIS — R0989 Other specified symptoms and signs involving the circulatory and respiratory systems: Secondary | ICD-10-CM | POA: Insufficient documentation

## 2012-08-14 DIAGNOSIS — Z87891 Personal history of nicotine dependence: Secondary | ICD-10-CM | POA: Insufficient documentation

## 2012-08-14 DIAGNOSIS — R0609 Other forms of dyspnea: Secondary | ICD-10-CM | POA: Insufficient documentation

## 2012-08-14 DIAGNOSIS — E119 Type 2 diabetes mellitus without complications: Secondary | ICD-10-CM | POA: Insufficient documentation

## 2012-08-14 DIAGNOSIS — E785 Hyperlipidemia, unspecified: Secondary | ICD-10-CM | POA: Insufficient documentation

## 2012-08-14 DIAGNOSIS — R079 Chest pain, unspecified: Secondary | ICD-10-CM

## 2012-08-14 DIAGNOSIS — I10 Essential (primary) hypertension: Secondary | ICD-10-CM | POA: Insufficient documentation

## 2012-08-14 LAB — POCT I-STAT GLUCOSE
Glucose, Bld: 173 mg/dL — ABNORMAL HIGH (ref 70–99)
Operator id: 221371

## 2012-08-14 SURGERY — JV LEFT HEART CATHETERIZATION WITH CORONARY ANGIOGRAM
Anesthesia: Moderate Sedation

## 2012-08-14 MED ORDER — SODIUM CHLORIDE 0.9 % IV SOLN: 250.0000 mL | INTRAVENOUS | Status: DC | PRN

## 2012-08-14 MED ORDER — ASPIRIN 81 MG PO CHEW
324.0000 mg | CHEWABLE_TABLET | ORAL | Status: AC
  Administered 2012-08-14: 324 mg via ORAL

## 2012-08-14 MED ORDER — SODIUM CHLORIDE 0.9 % IJ SOLN: 3.0000 mL | Freq: Two times a day (BID) | INTRAMUSCULAR | Status: DC

## 2012-08-14 MED ORDER — ACETAMINOPHEN 325 MG PO TABS: 650.0000 mg | ORAL_TABLET | ORAL | Status: DC | PRN

## 2012-08-14 MED ORDER — SODIUM CHLORIDE 0.9 % IV SOLN: INTRAVENOUS | Status: DC

## 2012-08-14 MED ORDER — SODIUM CHLORIDE 0.9 % IJ SOLN: 3.0000 mL | INTRAMUSCULAR | Status: DC | PRN

## 2012-08-14 MED ORDER — ONDANSETRON HCL 4 MG/2ML IJ SOLN: 4.0000 mg | Freq: Four times a day (QID) | INTRAMUSCULAR | Status: DC | PRN

## 2012-08-14 NOTE — CV Procedure (Signed)
   Cardiac Catheterization Procedure Note  Name: Nathan [name] MRN: [id number] DOB: May 14, 1975  Procedure: Left Heart Cath, Selective Coronary Angiography, LV angiography  Indication:  Chest pain suggestive of unstable angina  Procedural details: The right radial was prepped, draped, and anesthetized with 1% lidocaine. Using modified Seldinger technique, a 5 French sheath was introduced into the right radial artery. Standard Judkins catheters were used for coronary angiography and left ventriculography. Catheter exchanges were performed over a guidewire. There were no immediate procedural complications. The patient was transferred to the post catheterization recovery area for further monitoring.  Procedural Findings:   Hemodynamics:     AO 125/86    LV 128/18   Coronary angiography:   Coronary dominance: Right  Left mainstem:   Short and normal.    Left anterior descending (LAD):   Large and wrapping the apex.  Normal.  Diagonals x 3 small and normal.  Left circumflex (LCx):  AV groove normal.  MOM large and normal.  RI very large normal.    Right coronary artery (RCA):  Dominant but not a particularl large vessel.  Catheter induced coronary spasm.  PDA small and normal.   Left ventriculography: Left ventricular systolic function is normal, LVEF is estimated at 65%, there is no significant mitral regurgitation   Final Conclusions:    Normal coronaries.  NL LV function.    Recommendations: No further cardiac work up.   Rollene Rotunda 08/14/2012, 10:23 AM

## 2012-08-14 NOTE — OR Nursing (Signed)
Dr Hochrein at bedside to discuss results and treatment plan with pt and family 

## 2012-08-14 NOTE — OR Nursing (Signed)
+  Allen's test right hand 

## 2012-08-14 NOTE — Interval H&P Note (Signed)
History and Physical Interval Note:  08/14/2012 9:51 AM  Nathan [name]  has presented today for surgery, with the diagnosis of cp  The various methods of treatment have been discussed with the patient and family. After consideration of risks, benefits and other options for treatment, the patient has consented to  Procedure(s): JV LEFT HEART CATHETERIZATION WITH CORONARY ANGIOGRAM (N/A) as a surgical intervention .  The patient's history has been reviewed, patient examined, no change in status, stable for surgery.  I have reviewed the patient's chart and labs.  Questions were answered to the patient's satisfaction.     Rollene Rotunda

## 2012-08-14 NOTE — H&P (View-Only) (Signed)
Primary Cardiologist: Suresh Koneswaran, MD   HPI: Post hospital followup from [facility], following recent presentation with atypical CP. Troponins NL. He presents today to establish with our office, and to arrange for an elective cardiac catheterization, as recommended.  Patient presented with no previously documented CAD, but with numerous CRFs notable for DM, HTN, HLD, history of tobacco, and significant FH. He also had prior negative stress tests, most recently in June 2012. We recommended an in-house echocardiogram and, if normal, to consider further evaluation as an outpatient with elective cardiac catheterization.   - Echocardiogram, July 16: EF 55-60%; no focal WMAs; no significant valvular disease  He presents today reporting no significant change from his baseline, having been discharged from the hospital just 2 days ago. His wife, however, remains quite concerned that her husband develops significant diaphoresis and dyspnea with minimal exertion, such as walking. He, himself, suggests some worsening of his CP with walking, but which essentially has remained constant since his recent hospitalization.  Allergies  Allergen Reactions  . Benzocaine Anaphylaxis and Other (See Comments)    REACTION: Blistering all over and throat swells shut    Current Outpatient Prescriptions  Medication Sig Dispense Refill  . aspirin EC 81 MG tablet Take 81 mg by mouth every morning.      . atorvastatin (LIPITOR) 10 MG tablet Take 10 mg by mouth daily.      . gemfibrozil (LOPID) 600 MG tablet Take 600 mg by mouth 2 (two) times daily before a meal.      . lisinopril-hydrochlorothiazide (PRINZIDE,ZESTORETIC) 20-12.5 MG per tablet Take 1 tablet by mouth daily.      . metFORMIN (GLUCOPHAGE) 1000 MG tablet Take 1,000 mg by mouth 2 (two) times daily with a meal.      . saxagliptin HCl (ONGLYZA) 5 MG TABS tablet Take 5 mg by mouth daily.       No current facility-administered medications for this visit.    Past  Medical History  Diagnosis Date  . Pneumonia   . Diabetes mellitus   . Hypertension   . Cardiac arrest   . HLD (hyperlipidemia)   . TMJ (dislocation of temporomandibular joint)   . Syncope   . Obesity   . Chest pain     No past surgical history on file.  History   Social History  . Marital Status: Married    Spouse Name: N/A    Number of Children: N/A  . Years of Education: N/A   Occupational History  . Not on file.   Social History Main Topics  . Smoking status: Former Smoker -- 6 years    Quit date: 01/25/2008  . Smokeless tobacco: Not on file  . Alcohol Use: Yes     Comment: 3-4 beers weekly  . Drug Use: No  . Sexually Active: Not on file   Other Topics Concern  . Not on file   Social History Narrative  . No narrative on file    Family History  Problem Relation Age of Onset  . Heart failure Mother   . Heart attack Father     x5     ROS: no nausea, vomiting; no fever, chills; no melena, hematochezia; no claudication  PHYSICAL EXAM: BP 131/84  Pulse 80  Ht 5' 11" (1.803 m)  Wt 233 lb (105.688 kg)  BMI 32.51 kg/m2 GENERAL: 37 year-old male, obese; NAD HEENT: NCAT, PERRLA, EOMI; sclera clear; no xanthelasma NECK: palpable bilateral carotid pulses, no bruits; no JVD; no TM LUNGS:   CTA bilaterally CARDIAC: RRR (S1, S2); no significant murmurs; no rubs or gallops ABDOMEN: soft, non-tender; intact BS EXTREMETIES: Palpable femoral pulses without bruits; palpable dorsalis pedis pulses; no significant peripheral edema SKIN: warm/dry; no obvious rash/lesions MUSCULOSKELETAL: no joint deformity NEURO: no focal deficit; NL affect   EKG: reviewed and available in Electronic Records   ASSESSMENT & PLAN:  Chest pain Recommendation is to proceed with elective diagnostic coronary angiography, as recently outlined, for definitive exclusion of significant CAD. Both the patient and his wife are in agreement with this plan, and the risks/benefits were discussed.  Patient presents with no documented CAD, but with numerous CRFs, including DM and strong family history, and with history of prior negative stress tests, most recently in June 2012. He remains in clinically stable condition, having recently ruled out for MI with NL troponins. He had an echocardiogram which was normal. Moreover, his symptoms are essentially unchanged, since his recent hospitalization. Nevertheless, we will arrange to have this done early next week. He has requested to refrain from returning to work in the interim, with which we will oblige. This plan was reviewed with, and approved by, Dr. McDowell.    Gene Saiya Crist, PAC  

## 2012-08-14 NOTE — OR Nursing (Signed)
TR band removed, site level 0, pressure dressing and wrist splint applied, distal circulation intact, discharge instructions reviewed and signed, pt stated understanding, ambulated in hall without difficulty, transported to wife's car via wheelchair 

## 2012-08-14 NOTE — OR Nursing (Signed)
Meal served 

## 2012-08-20 ENCOUNTER — Telehealth: Payer: Self-pay | Admitting: *Deleted

## 2012-08-20 NOTE — Telephone Encounter (Signed)
Abbe Amsterdam, mother of patient, called stating that someone accidentally called the patient's father Nathan [name]) instead of Nathan [name] to remind him of appt. Mother just wanted [place] to be aware he did not get the message, as he does not live with them. Called [name] on his mobile number but was unable to leave message as the phone does not accept VM.

## 2012-09-06 ENCOUNTER — Encounter: Payer: BC Managed Care – PPO | Admitting: Physician Assistant

## 2012-09-14 ENCOUNTER — Encounter: Payer: Self-pay | Admitting: Physician Assistant

## 2012-09-14 ENCOUNTER — Ambulatory Visit (INDEPENDENT_AMBULATORY_CARE_PROVIDER_SITE_OTHER): Payer: BC Managed Care – PPO | Admitting: Physician Assistant

## 2012-09-14 VITALS — BP 130/85 | HR 80 | Ht 71.0 in | Wt 245.0 lb

## 2012-09-14 DIAGNOSIS — R079 Chest pain, unspecified: Secondary | ICD-10-CM

## 2012-09-14 NOTE — Assessment & Plan Note (Signed)
We reviewed the results of the recent normal coronary angiogram, which provided considerable reassurance to the patient and his wife. He has renewed energy, and has not had any recurrent CP. He is to resume followup with his primary care team for ongoing aggressive CRF monitoring and management, and is to return to Dr. Purvis Sheffield on an as-needed basis.

## 2012-09-14 NOTE — Patient Instructions (Signed)
Continue all current medications. Follow up as needed  

## 2012-09-14 NOTE — Progress Notes (Signed)
Primary Cardiologist: Prentice Docker, MD   HPI: Status post elective cardiac catheterization, ordered at time of last OV, for ongoing evaluation of persistent DOE with associated CP.  Patient had been recently hospitalized here at [facility] with atypical CP, ruled out for MI with NL troponins, and had a NL echo. He presented with no previously documented CAD, but with numerous CRFs notable for DM, HTN, HLD, history of tobacco, and significant FH. He also had prior negative stress tests, most recently in June 2012. He continued to complain of DOE with associated CP, and agreed to proceed with further evaluation with an elective cardiac catheterization.    - Cardiac catheterization, July 22: Normal coronaries; NL LV function  Patient returns today reporting no further CP. He denies any complications of R wrist incision site.  Allergies  Allergen Reactions  . Benzocaine Anaphylaxis and Other (See Comments)    REACTION: Blistering all over and throat swells shut    Current Outpatient Prescriptions  Medication Sig Dispense Refill  . aspirin EC 81 MG tablet Take 81 mg by mouth every morning.      Marland Kitchen atorvastatin (LIPITOR) 10 MG tablet Take 10 mg by mouth daily.      Marland Kitchen gemfibrozil (LOPID) 600 MG tablet Take 600 mg by mouth 2 (two) times daily before a meal.      . lisinopril-hydrochlorothiazide (PRINZIDE,ZESTORETIC) 20-12.5 MG per tablet Take 1 tablet by mouth daily.      . metFORMIN (GLUCOPHAGE) 1000 MG tablet Take 1,000 mg by mouth 2 (two) times daily with a meal.      . saxagliptin HCl (ONGLYZA) 5 MG TABS tablet Take 5 mg by mouth daily.       No current facility-administered medications for this visit.    Past Medical History  Diagnosis Date  . Pneumonia   . Diabetes mellitus   . Hypertension   . Cardiac arrest   . HLD (hyperlipidemia)   . TMJ (dislocation of temporomandibular joint)   . Syncope   . Obesity   . Chest pain     No past surgical history on file.  History   Social  History  . Marital Status: Married    Spouse Name: N/A    Number of Children: N/A  . Years of Education: N/A   Occupational History  . Not on file.   Social History Main Topics  . Smoking status: Former Smoker -- 6 years    Quit date: 01/25/2008  . Smokeless tobacco: Not on file  . Alcohol Use: Yes     Comment: 3-4 beers weekly  . Drug Use: No  . Sexual Activity: Not on file   Other Topics Concern  . Not on file   Social History Narrative  . No narrative on file   Social History Narrative  . No narrative on file    Problem Relation Age of Onset  . Heart failure Mother   . Heart attack Father     x5     ROS: no nausea, vomiting; no fever, chills; no melena, hematochezia; no claudication  PHYSICAL EXAM: BP 130/85  Pulse 80  Ht 5\' 11"  (1.803 m)  Wt 245 lb (111.131 kg)  BMI 34.19 kg/m2 GENERAL: 37 year-old male, obese; NAD  HEENT: NCAT, PERRLA, EOMI; sclera clear; no xanthelasma  NECK: no JVD; no TM  LUNGS: CTA bilaterally  CARDIAC: RRR (S1, S2); no significant murmurs; no rubs or gallops  ABDOMEN: soft, protuberant  EXTREMETIES: no significant peripheral edema  SKIN: warm/dry;  no obvious rash/lesions  MUSCULOSKELETAL: no joint deformity  NEURO: no focal deficit; NL affect    EKG:    ASSESSMENT & PLAN:  Chest pain We reviewed the results of the recent normal coronary angiogram, which provided considerable reassurance to the patient and his wife. He has renewed energy, and has not had any recurrent CP. He is to resume followup with his primary care team for ongoing aggressive CRF monitoring and management, and is to return to Dr. Purvis Sheffield on an as-needed basis.    Gene Monta Maiorana, PAC

## 2014-03-07 ENCOUNTER — Emergency Department (HOSPITAL_COMMUNITY)
Admission: EM | Admit: 2014-03-07 | Discharge: 2014-03-07 | Disposition: A | Discharge status: Home / Self Care | Payer: 59 | Attending: Emergency Medicine | Admitting: Emergency Medicine

## 2014-03-07 ENCOUNTER — Encounter (HOSPITAL_COMMUNITY): Payer: Self-pay | Admitting: *Deleted

## 2014-03-07 DIAGNOSIS — I10 Essential (primary) hypertension: Secondary | ICD-10-CM | POA: Diagnosis not present

## 2014-03-07 DIAGNOSIS — R21 Rash and other nonspecific skin eruption: Secondary | ICD-10-CM | POA: Diagnosis present

## 2014-03-07 DIAGNOSIS — Z87828 Personal history of other (healed) physical injury and trauma: Secondary | ICD-10-CM | POA: Diagnosis not present

## 2014-03-07 DIAGNOSIS — Z79899 Other long term (current) drug therapy: Secondary | ICD-10-CM | POA: Diagnosis not present

## 2014-03-07 DIAGNOSIS — E669 Obesity, unspecified: Secondary | ICD-10-CM | POA: Diagnosis not present

## 2014-03-07 DIAGNOSIS — L309 Dermatitis, unspecified: Secondary | ICD-10-CM | POA: Insufficient documentation

## 2014-03-07 DIAGNOSIS — E119 Type 2 diabetes mellitus without complications: Secondary | ICD-10-CM | POA: Diagnosis not present

## 2014-03-07 DIAGNOSIS — Z8701 Personal history of pneumonia (recurrent): Secondary | ICD-10-CM | POA: Diagnosis not present

## 2014-03-07 DIAGNOSIS — Z87891 Personal history of nicotine dependence: Secondary | ICD-10-CM | POA: Diagnosis not present

## 2014-03-07 DIAGNOSIS — Z9889 Other specified postprocedural states: Secondary | ICD-10-CM | POA: Insufficient documentation

## 2014-03-07 MED ORDER — PREDNISONE 10 MG PO TABS: 40.0000 mg | ORAL_TABLET | Freq: Every day | ORAL | Status: DC

## 2014-03-07 MED ORDER — PREDNISONE 50 MG PO TABS
60.0000 mg | ORAL_TABLET | Freq: Once | ORAL | Status: AC
  Administered 2014-03-07: 60 mg via ORAL
  Filled 2014-03-07 (×2): qty 1

## 2014-03-07 NOTE — Discharge Instructions (Signed)
Take the prednisone as directed. Make an appointment to follow-up with your regular doctor. As we discussed the possible this could be a contact dermatitis but also somewhat concerning for like psoriasis. Return for any new or worse symptoms.

## 2014-03-07 NOTE — ED Notes (Signed)
Rash for 1 week, itches , burns.dry.

## 2014-03-07 NOTE — ED Notes (Signed)
Patient denies pain states he itches at times

## 2014-03-07 NOTE — ED Provider Notes (Addendum)
CSN: [id number]     Arrival date & time 03/07/14  1250 History   First MD Initiated Contact with Patient 03/07/14 1418     Chief Complaint  Patient presents with  . Rash     (Consider location/radiation/quality/duration/timing/severity/associated sxs/prior Treatment) Patient is a 39 y.o. male presenting with rash. The history is provided by the patient.  Rash Associated symptoms: no abdominal pain, no fever, no headaches, no joint pain, no myalgias and no shortness of breath    patient with one-week history of rash that's itchy and dry. No blistering. No history of similar rash. Predominantly and started on the backs of both hands. And now involves the left side of the neck parts of the anterior trunk back is diffusely red no hives. No travel outside the [place]. No change in any of products. No fevers. No nausea no vomiting no chest pain no shortness of breath no abdominal pain. No joint pain no muscle aches.  Past Medical History  Diagnosis Date  . Pneumonia   . Diabetes mellitus   . Hypertension   . Cardiac arrest   . HLD (hyperlipidemia)   . TMJ (dislocation of temporomandibular joint)   . Syncope   . Obesity   . Chest pain    Past Surgical History  Procedure Laterality Date  . Cardiac catheterization     Family History  Problem Relation Age of Onset  . Heart failure Mother   . Heart attack Father     x5    History  Substance Use Topics  . Smoking status: Former Smoker -- 6 years    Types: Cigarettes    Quit date: 01/25/2008  . Smokeless tobacco: Not on file  . Alcohol Use: Yes     Comment: 3-4 beers weekly    Review of Systems  Constitutional: Negative for fever.  HENT: Negative for congestion.   Eyes: Negative for visual disturbance.  Respiratory: Negative for shortness of breath.   Cardiovascular: Negative for chest pain.  Gastrointestinal: Negative for abdominal pain.  Genitourinary: Negative for dysuria.  Musculoskeletal: Negative for myalgias and  arthralgias.  Skin: Positive for rash.  Neurological: Negative for headaches.  Hematological: Does not bruise/bleed easily.  Psychiatric/Behavioral: Negative for confusion.      Allergies  Benzocaine and Orange juice  Home Medications   Prior to Admission medications   Medication Sig Start Date End Date Taking? Authorizing Provider  Albiglutide 50 MG PEN Inject 50 mg into the skin once a week. saturday   Yes Historical Provider, MD  canagliflozin (INVOKANA) 300 MG TABS tablet Take 300 mg by mouth every evening.   Yes Historical Provider, MD  levothyroxine (SYNTHROID, LEVOTHROID) 50 MCG tablet Take 50 mcg by mouth daily before breakfast.   Yes Historical Provider, MD  metFORMIN (GLUCOPHAGE) 1000 MG tablet Take 1,000 mg by mouth 2 (two) times daily with a meal.   Yes Historical Provider, MD  predniSONE (DELTASONE) 10 MG tablet Take 4 tablets (40 mg total) by mouth daily. 03/07/14   Vanetta MuldersScott Glennda Weatherholtz, MD   BP 164/97 mmHg  Pulse 99  Temp(Src) 98 F (36.7 C) (Oral)  Resp 16  Ht 5\' 11"  (1.803 m)  Wt 240 lb (108.863 kg)  BMI 33.49 kg/m2  SpO2 100% Physical Exam  Constitutional: He is oriented to person, place, and time. He appears well-developed and well-nourished. No distress.  HENT:  Head: Normocephalic and atraumatic.  Mouth/Throat: Oropharynx is clear and moist.  Eyes: Conjunctivae and EOM are normal. Pupils are equal, round,  and reactive to light.  Neck: Normal range of motion.  Cardiovascular: Normal rate, regular rhythm and normal heart sounds.   Pulmonary/Chest: Effort normal and breath sounds normal. No respiratory distress.  Abdominal: Soft. Bowel sounds are normal. There is no tenderness.  Musculoskeletal: Normal range of motion.  Neurological: He is alert and oriented to person, place, and time. No cranial nerve deficit. He exhibits normal muscle tone. Coordination normal.  Skin: Skin is warm. Rash noted. There is erythema.  Nursing note and vitals reviewed.   ED  Course  Procedures (including critical care time) Labs Review Labs Reviewed - No data to display  Imaging Review No results found.   EKG Interpretation None      MDM   Final diagnoses:  Dermatitis    Patient with rash to the backs of both hands very suggestive of psoriasis. Lots of erythema across the back. Also erythema to the left side of the neck. No true hives no blistering no vesicles. Possibly could be a contact dermatitis or perhaps psoriasis. Will treat with steroids and have him follow-up with his regular doctor.  Patient nontoxic no acute distress. No systemic symptoms of significance.  Vanetta Mulders, MD 03/07/14 1447  Vanetta Mulders, MD 03/07/14 [phone]

## 2014-11-03 ENCOUNTER — Ambulatory Visit (INDEPENDENT_AMBULATORY_CARE_PROVIDER_SITE_OTHER): Payer: 59 | Admitting: "Endocrinology

## 2014-11-03 ENCOUNTER — Encounter: Payer: Self-pay | Admitting: "Endocrinology

## 2014-11-03 VITALS — BP 139/89 | HR 90 | Ht 70.0 in | Wt 234.0 lb

## 2014-11-03 DIAGNOSIS — E039 Hypothyroidism, unspecified: Secondary | ICD-10-CM | POA: Diagnosis not present

## 2014-11-03 DIAGNOSIS — E1100 Type 2 diabetes mellitus with hyperosmolarity without nonketotic hyperglycemic-hyperosmolar coma (NKHHC): Secondary | ICD-10-CM | POA: Diagnosis not present

## 2014-11-03 DIAGNOSIS — I10 Essential (primary) hypertension: Secondary | ICD-10-CM | POA: Diagnosis not present

## 2014-11-03 DIAGNOSIS — E782 Mixed hyperlipidemia: Secondary | ICD-10-CM | POA: Insufficient documentation

## 2014-11-03 DIAGNOSIS — E785 Hyperlipidemia, unspecified: Secondary | ICD-10-CM | POA: Diagnosis not present

## 2014-11-03 MED ORDER — ALBIGLUTIDE 50 MG ~~LOC~~ PEN: 30.0000 mg | PEN_INJECTOR | SUBCUTANEOUS | Status: DC

## 2014-11-03 MED ORDER — LEVOTHYROXINE SODIUM 75 MCG PO TABS: 75.0000 ug | ORAL_TABLET | Freq: Every day | ORAL | Status: DC

## 2014-11-03 MED ORDER — NIACIN ER (ANTIHYPERLIPIDEMIC) 750 MG PO TBCR
750.0000 mg | EXTENDED_RELEASE_TABLET | Freq: Every day | ORAL | Status: DC
Start: 2014-11-03 — End: 2014-12-26

## 2014-11-03 MED ORDER — ATORVASTATIN CALCIUM 40 MG PO TABS: 40.0000 mg | ORAL_TABLET | Freq: Every day | ORAL | Status: DC

## 2014-11-03 MED ORDER — "INSULIN SYRINGE 29G X 1/2"" 0.5 ML MISC": Status: DC

## 2014-11-03 MED ORDER — INSULIN REGULAR HUMAN 100 UNIT/ML IJ SOLN: 20.0000 [IU] | Freq: Two times a day (BID) | INTRAMUSCULAR | Status: DC

## 2014-11-03 MED ORDER — CANAGLIFLOZIN 300 MG PO TABS
100.0000 mg | ORAL_TABLET | Freq: Every day | ORAL | Status: DC
Start: 2014-11-03 — End: 2015-03-05

## 2014-11-03 MED ORDER — METFORMIN HCL 1000 MG PO TABS: 1000.0000 mg | ORAL_TABLET | Freq: Two times a day (BID) | ORAL | Status: DC

## 2014-11-03 NOTE — Progress Notes (Signed)
Subjective:    Patient ID: Nathan [name], male    DOB: 1975-04-26,    Past Medical History  Diagnosis Date  . Pneumonia   . Diabetes mellitus   . Hypertension   . Cardiac arrest (HCC)   . HLD (hyperlipidemia)   . TMJ (dislocation of temporomandibular joint)   . Syncope   . Obesity   . Chest pain    Past Surgical History  Procedure Laterality Date  . Cardiac catheterization     Social History   Social History  . Marital Status: Married    Spouse Name: N/A  . Number of Children: N/A  . Years of Education: N/A   Social History Main Topics  . Smoking status: Former Smoker -- 6 years    Types: Cigarettes    Quit date: 01/25/2008  . Smokeless tobacco: None  . Alcohol Use: Yes     Comment: 3-4 beers weekly  . Drug Use: No  . Sexual Activity: Not Asked   Other Topics Concern  . None   Social History Narrative   Outpatient Encounter Prescriptions as of 11/03/2014  Medication Sig  . aspirin 81 MG tablet Take 81 mg by mouth daily.  Marland Kitchen levothyroxine (SYNTHROID, LEVOTHROID) 75 MCG tablet Take 1 tablet (75 mcg total) by mouth daily before breakfast.  . metFORMIN (GLUCOPHAGE) 1000 MG tablet Take 1 tablet (1,000 mg total) by mouth 2 (two) times daily with a meal.  . [DISCONTINUED] levothyroxine (SYNTHROID, LEVOTHROID) 50 MCG tablet Take 50 mcg by mouth daily before breakfast.  . [DISCONTINUED] metFORMIN (GLUCOPHAGE) 1000 MG tablet Take 1,000 mg by mouth 2 (two) times daily with a meal.  . Albiglutide 50 MG PEN Inject 30 mg into the skin once a week. saturday  . atorvastatin (LIPITOR) 40 MG tablet Take 1 tablet (40 mg total) by mouth daily.  . canagliflozin (INVOKANA) 300 MG TABS tablet Take 100 mg by mouth daily before breakfast.  . insulin regular (NOVOLIN R,HUMULIN R) 100 units/mL injection Inject 0.2 mLs (20 Units total) into the skin 2 (two) times daily before a meal. With breakfast and with supper only if blood glucose is above 90 md/dl.  . INSULIN SYRINGE .5CC/29G  29G X 1/2" 0.5 ML MISC Use 2 times a day  . niacin (NIASPAN) 750 MG CR tablet Take 1 tablet (750 mg total) by mouth at bedtime.  . predniSONE (DELTASONE) 10 MG tablet Take 4 tablets (40 mg total) by mouth daily.  . [DISCONTINUED] Albiglutide 50 MG PEN Inject 50 mg into the skin once a week. saturday  . [DISCONTINUED] canagliflozin (INVOKANA) 300 MG TABS tablet Take 300 mg by mouth every evening.   No facility-administered encounter medications on file as of 11/03/2014.   ALLERGIES: Allergies  Allergen Reactions  . Benzocaine Anaphylaxis and Other (See Comments)    REACTION: Blistering all over and throat swells shut  . Orange Juice [Orange Oil] Anaphylaxis   VACCINATION STATUS:  There is no immunization history on file for this patient.  Diabetes He presents for his follow-up diabetic visit. He has type 2 diabetes mellitus. His disease course has been worsening (He was diagnosed with type 2 diabetes at approximate age of 33 years.). There are no hypoglycemic associated symptoms. Pertinent negatives for hypoglycemia include no confusion, headaches, pallor or seizures. There are no diabetic associated symptoms. Pertinent negatives for diabetes include no chest pain, no fatigue, no polydipsia, no polyphagia, no polyuria and no weakness. There are no hypoglycemic complications. Symptoms are worsening. Risk  factors for coronary artery disease include dyslipidemia, family history, diabetes mellitus, obesity, male sex and sedentary lifestyle. Current diabetic treatment includes oral agent (dual therapy) (He was supposed to be on metformin, invokana, and tanzeum ; however he ran out of all of these medications for the last 2 months.). He is compliant with treatment some of the time. His weight is decreasing steadily. He is following a generally unhealthy diet. He has not had a previous visit with a dietitian. He participates in exercise intermittently.  Hyperglycemia This is a chronic problem. The  current episode started more than 1 year ago. Pertinent negatives include no abdominal pain, chest pain, coughing, fatigue, headaches, myalgias, nausea, neck pain, numbness, rash, vomiting or weakness.  Hypertension This is a chronic problem. The current episode started more than 1 year ago. Pertinent negatives include no chest pain, headaches, neck pain, palpitations or shortness of breath.  Hyperlipidemia This is a chronic problem. The current episode started more than 1 year ago. The problem is uncontrolled. Recent lipid tests were reviewed and are high. Exacerbating diseases include diabetes, hypothyroidism and obesity. Exacerbated by: Medical noncompliance. Pertinent negatives include no chest pain, myalgias or shortness of breath. Treatments tried: Was supposed to be on atorvastatin and Niaspan, liver patient did not take these medications for the last 2 months. Compliance problems include adherence to diet and medication cost.  Risk factors for coronary artery disease include dyslipidemia, family history, diabetes mellitus, male sex, obesity and a sedentary lifestyle.     Review of Systems  Constitutional: Positive for unexpected weight change. Negative for fatigue.  HENT: Negative for dental problem, mouth sores and trouble swallowing.   Eyes: Negative for visual disturbance.  Respiratory: Negative for cough, choking, chest tightness, shortness of breath and wheezing.   Cardiovascular: Negative for chest pain, palpitations and leg swelling.  Gastrointestinal: Negative for nausea, vomiting, abdominal pain, diarrhea, constipation and abdominal distention.  Endocrine: Negative for polydipsia, polyphagia and polyuria.  Genitourinary: Negative for dysuria, urgency, hematuria and flank pain.  Musculoskeletal: Negative for myalgias, back pain, gait problem and neck pain.  Skin: Negative for pallor, rash and wound.  Neurological: Negative for seizures, syncope, weakness, numbness and headaches.   Psychiatric/Behavioral: Negative.  Negative for confusion and dysphoric mood.    Objective:    BP 139/89 mmHg  Pulse 90  Ht  (1.778 m)  Wt 234 lb (106.142 kg)  BMI 33.58 kg/m2  SpO2 97%  Wt Readings from Last 3 Encounters:  11/03/14 234 lb (106.142 kg)  03/07/14 240 lb (108.863 kg)  09/14/12 245 lb (111.131 kg)    Physical Exam  Constitutional: He is oriented to person, place, and time. He appears well-developed and well-nourished. He is cooperative. No distress.  HENT:  Head: Normocephalic and atraumatic.  Eyes: EOM are normal.  Neck: Normal range of motion. Neck supple. No tracheal deviation present. No thyromegaly present.  Cardiovascular: Normal rate, S1 normal, S2 normal and normal heart sounds.  Exam reveals no gallop.   No murmur heard. Pulses:      Dorsalis pedis pulses are 1+ on the right side, and 1+ on the left side.       Posterior tibial pulses are 1+ on the right side, and 1+ on the left side.  Pulmonary/Chest: Breath sounds normal. No respiratory distress. He has no wheezes.  Abdominal: Soft. Bowel sounds are normal. He exhibits no distension. There is no tenderness. There is no guarding and no CVA tenderness.  Musculoskeletal: He exhibits no edema.  Right shoulder: He exhibits no swelling and no deformity.  Neurological: He is alert and oriented to person, place, and time. He has normal strength and normal reflexes. No cranial nerve deficit or sensory deficit. Gait normal.  Skin: Skin is warm and dry. No rash noted. No cyanosis. Nails show no clubbing.  Psychiatric: He has a normal mood and affect. His speech is normal and behavior is normal. Judgment and thought content normal. Cognition and memory are normal.    Results for orders placed or performed during the hospital encounter of 08/14/12  I-STAT glucose  Result Value Ref Range   Operator id [id number]    Glucose, Bld 173 (H) 70 - 99 mg/dL   Complete Blood Count (Most recent): Lab Results   Component Value Date   WBC 7.5 09/13/2011   HGB 13.8 09/13/2011   HCT 39.7 09/13/2011   MCV 87.3 09/13/2011   PLT 269 09/13/2011   Chemistry (most recent): Lab Results  Component Value Date   NA 136 09/13/2011   K 3.8 09/13/2011   CL 97 09/13/2011   CO2 29 09/13/2011   BUN 8 09/13/2011   CREATININE 0.72 09/13/2011   Diabetic Labs (most recent): Lab Results  Component Value Date   HGBA1C 12.1* 07/02/2010   HGBA1C * 07/01/2010    12.0 (NOTE)                                                                       According to the ADA Clinical Practice Recommendations for 2011, when HbA1c is used as a screening test:   >=6.5%   Diagnostic of Diabetes Mellitus           (if abnormal result  is confirmed)  5.7-6.4%   Increased risk of developing Diabetes Mellitus  References:Diagnosis and Classification of Diabetes Mellitus,Diabetes Care,2011,34(Suppl 1):S62-S69 and Standards of Medical Care in         Diabetes - 2011,Diabetes Care,2011,34  (Suppl 1):S11-S61.   Lipid profile (most recent): Lab Results  Component Value Date   TRIG 440* 07/01/2010   CHOL 218* 07/01/2010         Assessment & Plan:   1. Uncontrolled type 2 diabetes mellitus with hyperosmolarity without coma, without long-term current use of insulin (HCC)  His diabetes is  complicated by severe hypertriglyceridemia and nonadherence to medication. Patient came with worsening A1c of 11.1 % increased from 7.9%.  Recent labs reviewed. - Patient remains at a high risk for more acute and chronic complications of diabetes which include CAD, CVA, CKD, retinopathy, and neuropathy. These are all discussed in detail with the patient.  - I have re-counseled the patient on diet management and weight loss  by adopting a carbohydrate restricted / protein rich  Diet. - Patient is advised to stick to a routine mealtimes to eat 3 meals  a day and avoid unnecessary snacks ( to snack only to correct hypoglycemia).  - Suggestion is  made for patient to avoid simple carbohydrates   from their diet including Cakes , Desserts, Ice Cream,  Soda (  diet and regular) , Sweet Tea , Candies,  Chips, Cookies, Artificial Sweeteners,   and "Sugar-free" Products .  This will help patient to have stable blood glucose profile and  potentially avoid unintended  Weight gain.  - The patient  will be  scheduled with Norm Salt, RDN, CDE for individualized DM education. - I have approached patient with the following individualized plan to manage diabetes and patient agrees.  - I have approached him for insulin treatment and he reluctantly accepts. -I will initiate premixed Novolin 70/30  20 units with breakfast and 20 units with supper for pre-meal glucose of greater than 90 mg/DL associated with strict monitoring of glucose  AC and HS. - Patient is warned not to take insulin without proper monitoring per orders. -Adjustment parameters are given for hypo and hyperglycemia in writing. -Patient is encouraged to call clinic for blood glucose levels less than 70 or above 300 mg /dl. - I will lower his dose of  tanzeum  To 30 mg sq weekly due to high risk of pancreatitis from severe hypertriglyceridemia at 1660 . - i will continue metformin 1000 mg by mouth twice a day, and Invokana 100 mg by mouth every morning, therapeutically suitable for patient..  - Patient specific target  for A1c; LDL, HDL, Triglycerides, and  Waist Circumference were discussed in detail.  2) BP/HTN: Controlled. Continue current medications . 3) Lipids/HPL: Recent lipid panel is reviewed with him showing triglycerides extremity elevated at 1660 mg per DL. I urged him to resume atorvastatin 40 mg by mouth daily at bedtime and Niaspan 750 mg extended release by mouth daily. Marland Kitchen 4)  obesity:  Agrees to CDE consult, exercise, and carbohydrates information provided.  5) Chronic Care/Health Maintenance:  -Patient is on  Statin medications and encouraged to continue to follow up  with Ophthalmology, Podiatrist at least yearly or according to recommendations, and advised to stay away from smoking. I have recommended yearly flu vaccine and pneumonia vaccination at least every 5 years; moderate intensity exercise for up to 150 minutes weekly; and  sleep for at least 7 hours a day. 6) hypothyroidism: He would benefit from a slight increase in his levothyroxine dose. I would increase levothyroxine to 75 g by mouth every morning. Appropriate intake of levothyroxine discussed with the patient.  I advised patient to maintain close follow up with their PCP for primary care needs.  Patient is asked to bring meter and  blood glucose logs during their next visit.   Follow up plan: Return in about 2 weeks (around 11/17/2014) for follow up with meter and logs- no labs.  Marquis Lunch, MD Phone: [phone]  Fax: [phone]   11/03/2014, 12:06 PM

## 2014-11-03 NOTE — Patient Instructions (Signed)

## 2014-11-21 ENCOUNTER — Encounter: Payer: Self-pay | Admitting: "Endocrinology

## 2014-11-21 ENCOUNTER — Encounter: Payer: 59 | Admitting: "Endocrinology

## 2014-11-21 ENCOUNTER — Ambulatory Visit: Payer: Self-pay | Admitting: "Endocrinology

## 2014-11-27 NOTE — Progress Notes (Signed)
This encounter was created in error - please disregard.

## 2014-12-05 ENCOUNTER — Ambulatory Visit: Payer: 59 | Admitting: "Endocrinology

## 2014-12-12 ENCOUNTER — Encounter: Payer: Self-pay | Admitting: "Endocrinology

## 2014-12-12 ENCOUNTER — Encounter: Payer: 59 | Attending: "Endocrinology | Admitting: Nutrition

## 2014-12-12 ENCOUNTER — Telehealth: Payer: Self-pay | Admitting: Nutrition

## 2014-12-12 ENCOUNTER — Ambulatory Visit (INDEPENDENT_AMBULATORY_CARE_PROVIDER_SITE_OTHER): Payer: 59 | Admitting: "Endocrinology

## 2014-12-12 ENCOUNTER — Other Ambulatory Visit: Payer: Self-pay | Admitting: "Endocrinology

## 2014-12-12 VITALS — Ht 71.5 in | Wt 235.6 lb

## 2014-12-12 VITALS — BP 149/90 | HR 90 | Ht 71.5 in | Wt 236.4 lb

## 2014-12-12 DIAGNOSIS — E039 Hypothyroidism, unspecified: Secondary | ICD-10-CM | POA: Diagnosis not present

## 2014-12-12 DIAGNOSIS — E1159 Type 2 diabetes mellitus with other circulatory complications: Secondary | ICD-10-CM | POA: Diagnosis not present

## 2014-12-12 DIAGNOSIS — Z794 Long term (current) use of insulin: Secondary | ICD-10-CM | POA: Insufficient documentation

## 2014-12-12 DIAGNOSIS — E782 Mixed hyperlipidemia: Secondary | ICD-10-CM | POA: Insufficient documentation

## 2014-12-12 DIAGNOSIS — IMO0002 Reserved for concepts with insufficient information to code with codable children: Secondary | ICD-10-CM

## 2014-12-12 DIAGNOSIS — Z6832 Body mass index (BMI) 32.0-32.9, adult: Secondary | ICD-10-CM | POA: Diagnosis not present

## 2014-12-12 DIAGNOSIS — E785 Hyperlipidemia, unspecified: Secondary | ICD-10-CM | POA: Diagnosis not present

## 2014-12-12 DIAGNOSIS — E118 Type 2 diabetes mellitus with unspecified complications: Secondary | ICD-10-CM

## 2014-12-12 DIAGNOSIS — E1165 Type 2 diabetes mellitus with hyperglycemia: Secondary | ICD-10-CM | POA: Diagnosis not present

## 2014-12-12 DIAGNOSIS — Z713 Dietary counseling and surveillance: Secondary | ICD-10-CM | POA: Insufficient documentation

## 2014-12-12 DIAGNOSIS — I10 Essential (primary) hypertension: Secondary | ICD-10-CM

## 2014-12-12 MED ORDER — OMEGA-3-ACID ETHYL ESTERS 1 G PO CAPS: 2.0000 g | ORAL_CAPSULE | Freq: Two times a day (BID) | ORAL | Status: DC

## 2014-12-12 MED ORDER — CANAGLIFLOZIN 100 MG PO TABS: 100.0000 mg | ORAL_TABLET | Freq: Every day | ORAL | Status: DC

## 2014-12-12 MED ORDER — LISINOPRIL 20 MG PO TABS: 20.0000 mg | ORAL_TABLET | Freq: Every day | ORAL | Status: DC

## 2014-12-12 NOTE — Progress Notes (Signed)
Subjective:    Patient ID: Nathan [name], male    DOB: 1975-08-19,  .MANN, [name], PA-C   Past Medical History  Diagnosis Date  . Pneumonia   . Diabetes mellitus   . Hypertension   . Cardiac arrest (HCC)   . HLD (hyperlipidemia)   . TMJ (dislocation of temporomandibular joint)   . Syncope   . Obesity   . Chest pain    Past Surgical History  Procedure Laterality Date  . Cardiac catheterization     Social History   Social History  . Marital Status: Married    Spouse Name: N/A  . Number of Children: N/A  . Years of Education: N/A   Social History Main Topics  . Smoking status: Former Smoker -- 6 years    Types: Cigarettes    Quit date: 01/25/2008  . Smokeless tobacco: None  . Alcohol Use: 2.4 oz/week    4 Cans of beer per week     Comment: 3-4 beers weekly  . Drug Use: No  . Sexual Activity: Not Asked   Other Topics Concern  . None   Social History Narrative   Outpatient Encounter Prescriptions as of 12/12/2014  Medication Sig  . aspirin 81 MG tablet Take 81 mg by mouth daily.  Marland Kitchen atorvastatin (LIPITOR) 40 MG tablet Take 1 tablet (40 mg total) by mouth daily.  . insulin regular (NOVOLIN R,HUMULIN R) 100 units/mL injection Inject 0.2 mLs (20 Units total) into the skin 2 (two) times daily before a meal. With breakfast and with supper only if blood glucose is above 90 md/dl.  . INSULIN SYRINGE .5CC/29G 29G X 1/2" 0.5 ML MISC Use 2 times a day  . levothyroxine (SYNTHROID, LEVOTHROID) 75 MCG tablet Take 1 tablet (75 mcg total) by mouth daily before breakfast.  . metFORMIN (GLUCOPHAGE) 1000 MG tablet Take 1 tablet (1,000 mg total) by mouth 2 (two) times daily with a meal.  . [DISCONTINUED] Albiglutide 50 MG PEN Inject 30 mg into the skin once a week. saturday  . [DISCONTINUED] canagliflozin (INVOKANA) 300 MG TABS tablet Take 100 mg by mouth daily before breakfast.  . canagliflozin (INVOKANA) 100 MG TABS tablet Take 1 tablet (100 mg total) by mouth daily before  breakfast.  . lisinopril (PRINIVIL,ZESTRIL) 20 MG tablet Take 1 tablet (20 mg total) by mouth daily.  . niacin (NIASPAN) 750 MG CR tablet Take 1 tablet (750 mg total) by mouth at bedtime.  Marland Kitchen omega-3 acid ethyl esters (LOVAZA) 1 G capsule Take 2 capsules (2 g total) by mouth 2 (two) times daily.  . predniSONE (DELTASONE) 10 MG tablet Take 4 tablets (40 mg total) by mouth daily.   No facility-administered encounter medications on file as of 12/12/2014.   ALLERGIES: Allergies  Allergen Reactions  . Benzocaine Anaphylaxis and Other (See Comments)    REACTION: Blistering all over and throat swells shut  . Orange Juice [Orange Oil] Anaphylaxis  . Grapefruit Concentrate    VACCINATION STATUS:  There is no immunization history on file for this patient.  Diabetes He presents for his follow-up diabetic visit. He has type 2 diabetes mellitus. His disease course has been worsening (He was diagnosed with type 2 diabetes at approximate age of 33 years.). There are no hypoglycemic associated symptoms. Pertinent negatives for hypoglycemia include no confusion, headaches, pallor or seizures. There are no diabetic associated symptoms. Pertinent negatives for diabetes include no chest pain, no fatigue, no polydipsia, no polyphagia, no polyuria and no weakness. There are  no hypoglycemic complications. Symptoms are worsening. Diabetic complications include heart disease. Risk factors for coronary artery disease include dyslipidemia, family history, diabetes mellitus, obesity, male sex and sedentary lifestyle. Current diabetic treatment includes oral agent (dual therapy) (He was supposed to pick up the prescription for Novolin 70/30 to take along with his metformin, invokana, and tanzeum ; however he  he did not take His insulin.). He is compliant with treatment some of the time. His weight is decreasing steadily. He is following a generally unhealthy diet. He has had a previous visit with a dietitian. He  participates in exercise intermittently. Home blood sugar record trend: He monitored 1-2 times a day despite my advice for him to test 4 times a day. His overall blood glucose range is 180-200 mg/dl. An ACE inhibitor/angiotensin II receptor blocker is not being taken. Eye exam is current.  Hyperglycemia This is a chronic problem. The current episode started more than 1 year ago. Pertinent negatives include no abdominal pain, chest pain, coughing, fatigue, headaches, myalgias, nausea, neck pain, numbness, rash, vomiting or weakness. Treatments tried: He was supposed to be on Lovaza, Lipitor. However he is not on Lovaza for unclear reasons.  Hypertension This is a chronic problem. The current episode started more than 1 year ago. The problem is uncontrolled. Pertinent negatives include no chest pain, headaches, neck pain, palpitations or shortness of breath. Past treatments include nothing. Hypertensive end-organ damage includes a thyroid problem.  Hyperlipidemia This is a chronic problem. The current episode started more than 1 year ago. The problem is uncontrolled. Recent lipid tests were reviewed and are high. Exacerbating diseases include diabetes, hypothyroidism and obesity. Exacerbated by: Medical noncompliance. Pertinent negatives include no chest pain, myalgias or shortness of breath. Treatments tried: Was supposed to be on atorvastatin and Niaspan, liver patient did not take these medications for the last 2 months. Compliance problems include adherence to diet and medication cost.  Risk factors for coronary artery disease include dyslipidemia, family history, diabetes mellitus, male sex, obesity and a sedentary lifestyle.  Thyroid Problem Presents for follow-up visit. Patient reports no constipation, diarrhea, fatigue or palpitations. The symptoms have been stable. Past treatments include levothyroxine. His past medical history is significant for diabetes and hyperlipidemia.     Review of Systems   Constitutional: Positive for unexpected weight change. Negative for fatigue.  HENT: Negative for dental problem, mouth sores and trouble swallowing.   Eyes: Negative for visual disturbance.  Respiratory: Negative for cough, choking, chest tightness, shortness of breath and wheezing.   Cardiovascular: Negative for chest pain, palpitations and leg swelling.  Gastrointestinal: Negative for nausea, vomiting, abdominal pain, diarrhea, constipation and abdominal distention.  Endocrine: Negative for polydipsia, polyphagia and polyuria.  Genitourinary: Negative for dysuria, urgency, hematuria and flank pain.  Musculoskeletal: Negative for myalgias, back pain, gait problem and neck pain.  Skin: Negative for pallor, rash and wound.  Neurological: Negative for seizures, syncope, weakness, numbness and headaches.  Psychiatric/Behavioral: Negative.  Negative for confusion and dysphoric mood.    Objective:    BP 149/90 mmHg  Pulse 90  Ht 5' 11.5" (1.816 m)  Wt 236 lb 6.4 oz (107.23 kg)  BMI 32.52 kg/m2  SpO2 95%  Wt Readings from Last 3 Encounters:  12/12/14 236 lb 6.4 oz (107.23 kg)  12/12/14 235 lb 9.6 oz (106.867 kg)  11/21/14 235 lb (106.595 kg)    Physical Exam  Constitutional: He is oriented to person, place, and time. He appears well-developed and well-nourished. He is cooperative. No  distress.  HENT:  Head: Normocephalic and atraumatic.  Eyes: EOM are normal.  Neck: Normal range of motion. Neck supple. No tracheal deviation present. No thyromegaly present.  Cardiovascular: Normal rate, S1 normal, S2 normal and normal heart sounds.  Exam reveals no gallop.   No murmur heard. Pulses:      Dorsalis pedis pulses are 1+ on the right side, and 1+ on the left side.       Posterior tibial pulses are 1+ on the right side, and 1+ on the left side.  Pulmonary/Chest: Breath sounds normal. No respiratory distress. He has no wheezes.  Abdominal: Soft. Bowel sounds are normal. He exhibits no  distension. There is no tenderness. There is no guarding and no CVA tenderness.  Musculoskeletal: He exhibits no edema.       Right shoulder: He exhibits no swelling and no deformity.  Neurological: He is alert and oriented to person, place, and time. He has normal strength and normal reflexes. No cranial nerve deficit or sensory deficit. Gait normal.  Skin: Skin is warm and dry. No rash noted. No cyanosis. Nails show no clubbing.  Psychiatric: He has a normal mood and affect. His speech is normal and behavior is normal. Judgment and thought content normal. Cognition and memory are normal.    Results for orders placed or performed during the hospital encounter of 08/14/12  I-STAT glucose  Result Value Ref Range   Operator id [id number]    Glucose, Bld 173 (H) 70 - 99 mg/dL   Complete Blood Count (Most recent): Lab Results  Component Value Date   WBC 7.5 09/13/2011   HGB 13.8 09/13/2011   HCT 39.7 09/13/2011   MCV 87.3 09/13/2011   PLT 269 09/13/2011   Chemistry (most recent): Lab Results  Component Value Date   NA 136 09/13/2011   K 3.8 09/13/2011   CL 97 09/13/2011   CO2 29 09/13/2011   BUN 8 09/13/2011   CREATININE 0.72 09/13/2011   Diabetic Labs (most recent): Lab Results  Component Value Date   HGBA1C 12.1* 07/02/2010   HGBA1C * 07/01/2010    12.0 (NOTE)                                                                       According to the ADA Clinical Practice Recommendations for 2011, when HbA1c is used as a screening test:   >=6.5%   Diagnostic of Diabetes Mellitus           (if abnormal result  is confirmed)  5.7-6.4%   Increased risk of developing Diabetes Mellitus  References:Diagnosis and Classification of Diabetes Mellitus,Diabetes Care,2011,34(Suppl 1):S62-S69 and Standards of Medical Care in         Diabetes - 2011,Diabetes Care,2011,34  (Suppl 1):S11-S61.   Lipid profile (most recent): Lab Results  Component Value Date   TRIG 440* 07/01/2010   CHOL 218*  07/01/2010     Assessment & Plan:   1. Uncontrolled type 2 diabetes mellitus with hyperosmolarity without coma, without long-term current use of insulin (HCC)  His diabetes is  complicated by severe hypertriglyceridemia and nonadherence to medication. Patient came with worsening A1c of 11.1 % increased from 7.9%.  Recent labs reviewed. - Patient remains at a high  risk for more acute and chronic complications of diabetes which include CAD, CVA, CKD, retinopathy, and neuropathy. These are all discussed in detail with the patient.  - I have re-counseled the patient on diet management and weight loss  by adopting a carbohydrate restricted / protein rich  Diet. - Patient is advised to stick to a routine mealtimes to eat 3 meals  a day and avoid unnecessary snacks ( to snack only to correct hypoglycemia).  - Suggestion is made for patient to avoid simple carbohydrates   from their diet including Cakes , Desserts, Ice Cream,  Soda (  diet and regular) , Sweet Tea , Candies,  Chips, Cookies, Artificial Sweeteners,   and "Sugar-free" Products .  This will help patient to have stable blood glucose profile and potentially avoid unintended  Weight gain.  - The patient  will be  scheduled with Norm Salt, RDN, CDE for individualized DM education. - I have approached patient with the following individualized plan to manage diabetes and patient agrees.  - I have approached him again for insulin treatment and he reluctantly accepts. -I will continue premixed insulin Novolin 70/30  20 units with breakfast and 20 units with supper for pre-meal glucose of greater than 90 mg/DL associated with strict monitoring of glucose  AC and HS. - Patient is warned not to take insulin without proper monitoring per orders. -Adjustment parameters are given for hypo and hyperglycemia in writing. -Patient is encouraged to call clinic for blood glucose levels less than 70 or above 300 mg /dl. - I will discontinue  tanzeum    due to high risk of pancreatitis from severe hypertriglyceridemia at 1660 . - I will continue metformin 1000 mg by mouth twice a day, and Invokana 100 mg by mouth every morning, therapeutically suitable for patient..  - Patient specific target  for A1c; LDL, HDL, Triglycerides, and  Waist Circumference were discussed in detail.  2) BP/HTN: uncontrolled. I will add lisinopril 20 mg by mouth every morning. He is counseled on the need to control hypertension to prevent complications.   3) Lipids/HPL: Recent lipid panel is reviewed with him showing triglycerides extremity elevated at 1660 mg per DL. I urged him to resume atorvastatin 40 mg by mouth daily at bedtime , Lovaza 2 g twice a day and Niaspan 750 mg extended release by mouth daily.  4)  obesity:  Agrees to CDE consult, exercise, and carbohydrates information provided. 5) hypothyroidism: He is advised to continue levothyroxine 75 g by mouth every morning.  - We discussed about correct intake of levothyroxine, at fasting, with water, separated by at least 30 minutes from breakfast, and separated by more than 4 hours from calcium, iron, multivitamins, acid reflux medications (PPIs). -Patient is made aware of the fact that thyroid hormone replacement is needed for life, dose to be adjusted by periodic monitoring of thyroid function tests.  6) Chronic Care/Health Maintenance:  -Patient is on  Statin medications and encouraged to continue to follow up with Ophthalmology, Podiatrist at least yearly or according to recommendations, and advised to stay away from smoking. I have recommended yearly flu vaccine and pneumonia vaccination at least every 5 years; moderate intensity exercise for up to 150 minutes weekly; and  sleep for at least 7 hours a day. 6) hypothyroidism: He would benefit from a slight increase in his levothyroxine dose. I would increase levothyroxine to 75 g by mouth every morning. Appropriate intake of levothyroxine discussed with the  patient.  - 30 minutes  of time was spent on the care of this patient , 50% of which was applied for counseling on diabetes complications and their preventions.  I advised patient to maintain close follow up with their PCP for primary care needs.  Patient is asked to bring meter and  blood glucose logs during their next visit.   Follow up plan: Return in about 3 months (around 03/14/2015) for diabetes, high blood pressure, high cholesterol, underactive thyroid, follow up with pre-visit labs, meter, and logs.  Marquis Lunch, MD Phone: [phone]  Fax: [phone]   12/12/2014, 4:09 PM

## 2014-12-12 NOTE — Patient Instructions (Signed)
Goals:  1. Follow My Plate Method as prescribed 2. Eat 45-60 g of carbs per meal. 3. Increase fresh fruits and vegetables. 4. Cut out soda, juices, tea, candy, sweets and fried foods 5. Test blood sugars 4 times per day as prescribed. 6. Take all medications as prescribed. 7. Get A1C down to 7% in three months

## 2014-12-12 NOTE — Patient Instructions (Signed)
Advice for weight management -For most of us the best way to lose weight is by diet management. Generally speaking, diet management means restricting carbohydrate consumption to minimum possible (and to unprocessed or minimally processed complex starch) and increasing protein intake (animal or plant source), fruits, and vegetables.  -Sticking to a routine mealtime to eat 3 meals a day and avoiding unnecessary snacks is shown to have a big role in weight control.  -It is better to avoid simple carbohydrates including: Cakes, Desserts, Ice Cream, Soda (diet and regular), Sweet Tea, Candies, Chips, Cookies, Artificial Sweeteners, and "Sugar-free" Products.   -Exercise: 30 minutes a day 3-4 days a week, or 150 minutes a week. Combine stretch, strength, and aerobic activities. You may seek evaluation by your heart doctor prior to initiating exercise if you have high risk for heart disease.  -Continue your  visit with Penny Crumpton, RDN, CDE for individualized nutrition education.  

## 2014-12-12 NOTE — Telephone Encounter (Signed)
[facility] and whatever you are ordering for his triglycerides since Tanzeum wasn't affordable.

## 2014-12-12 NOTE — Progress Notes (Signed)
  Medical Nutrition Therapy:  Appt start time: 1430 end time:  1500.  Assessment:  Primary concerns today: Diabetes Type 2 DM. Allergic to  acid foods. Lives with his wife and kids and his  Mom. PMH: MI, Hypertriglyceridemia.  He and his wife doing the cooking. Most foods are baked and grilled. He has not been testing as prescribed, didn't pick up his insulin to start using and has been non compliant with his diet. He notes he has been fatigued, hungry, thirsty and tired all the time. Diet is inadequate in all food groups and higher in sugar, fat and salt intake.  On Metformin, Invokana and Atorvastin. Checks blood sugars 1-2 times per day. Has been testing 1 hr after meal on occasion instead of before meals or 2 hours after meal. Was suppose to be taking insulin  Humulin R 20 units twice a day; with breakfast and with dinner meal. Didn't get prescription filled and hasn't stated taking it yet due to financial issues.. Non compliant with diet and medication management with his diabetes.  HE is at high risk for complications of CVD from DM and  Hypertriglyceridemia. Sees Dr. Fransico HimNida for his DM.  Preferred Learning Style:    No preference indicated    Learning Readiness:   Ready  Change in progress   MEDICATIONS: See list  DIETARY INTAKE:   24-hr recall:  B ( AM): Deer meat on biscuit with Regular soda. OR 2 steak and cheese biscuits.  Snk ( AM):  L ( PM): 10 pc chicken nuggets, from [place], Raspberry Tea and 32 oz water Snk ( PM):  D ( PM): Hungry man microwave meal, 2 steaks, green beans and mash pot and 2 yogurts, water, coffee Snk ( PM): none Beverages: soda, water, tea Usual physical activity:   Estimated energy needs: 1800 calories 200 g carbohydrates 135 g protein 50 g fat  Progress Towards Goal(s):  In progress.   Nutritional Diagnosis:  NB-1.1 Food and nutrition-related knowledge deficit As related to Diabetes.  As evidenced by A1C > 9%.    Intervention:  Nutrition and  Diabetes education provided on My Plate, CHO counting, meal planning, portion sizes, timing of meals, avoiding snacks between meals unless having a low blood sugar, target ranges for A1C and blood sugars, signs/symptoms and treatment of hyper/hypoglycemia, monitoring blood sugars, taking medications as prescribed, benefits of exercising 30 minutes per day and prevention of complications of DM.  Goals:  1. Follow My Plate Method as prescribed 2. Eat 45-60 g of carbs per meal. 3. Increase fresh fruits and vegetables. 4. Cut out soda, juices, tea, candy, sweets and fried foods 5. Test blood sugars 4 times per day as prescribed. 6. Take all medications as prescribed. 7. Get A1C down to 7% in three months  Teaching Method Utilized:  Visual Auditory Hands on  Handouts given during visit include:  The Plate Method  Meal Plan Card  Diabetes Instructions.  Barriers to learning/adherence to lifestyle change:  None  Demonstrated degree of understanding via:  Teach Back   Monitoring/Evaluation:  Dietary intake, exercise,  Meal planning, SBG, and body weight in 1 month(s).

## 2014-12-15 ENCOUNTER — Telehealth: Payer: Self-pay

## 2014-12-15 NOTE — Telephone Encounter (Signed)
Lovaza /Omega 3 is not covered by pts insurance.

## 2014-12-15 NOTE — Telephone Encounter (Signed)
Okay, He needs to stay on the rest of his medications.

## 2014-12-16 NOTE — Telephone Encounter (Signed)
Called pt. No Answer. No voice mail.

## 2014-12-17 NOTE — Telephone Encounter (Signed)
Pt.notified

## 2014-12-18 LAB — HEMOGLOBIN A1C: Hemoglobin A1C: 11.1

## 2014-12-26 ENCOUNTER — Ambulatory Visit (INDEPENDENT_AMBULATORY_CARE_PROVIDER_SITE_OTHER): Payer: 59 | Admitting: Cardiology

## 2014-12-26 ENCOUNTER — Encounter: Payer: Self-pay | Admitting: *Deleted

## 2014-12-26 ENCOUNTER — Encounter: Payer: Self-pay | Admitting: Cardiology

## 2014-12-26 VITALS — BP 140/76 | HR 88 | Ht 71.0 in | Wt 232.0 lb

## 2014-12-26 DIAGNOSIS — R002 Palpitations: Secondary | ICD-10-CM | POA: Diagnosis not present

## 2014-12-26 DIAGNOSIS — R0789 Other chest pain: Secondary | ICD-10-CM | POA: Diagnosis not present

## 2014-12-26 NOTE — Progress Notes (Signed)
Patient ID: Nathan [name], male   DOB: 01/16/1976, 39 y.o.   MRN: [id number]     Clinical Summary Mr. Nathan [name] is a 39 y.o.male seen today as a new patient for the following medical problems.  1. Hx of atypical chest pain - cath July 2014 with normal coroarnies - echo 06/2012 with LVEF 55-60%, no WMAs   2. Presyncope - episode  about 1 month ago while dragging deer home while hunting. Felt heart racing, felt lightheaded and dizzy and SOB. Felt like he "ran a marathon". Became presyncopal. Palpitations lasted about 30 minutes, became diaphoretic. Suddenly palpitations resolved and felt better.  - reports similar episodes over the last few years, typically twice a year for the last 3-4 years - 2 days ago had episode of palpitations, not as severe. 6 episodes lasting 1-3 minutes. Symptoms tend to be brought on with exertion  - coffee x 2 cups, 1 soda a day, no energy drinks.   Past Medical History  Diagnosis Date  . Pneumonia   . Diabetes mellitus   . Hypertension   . Cardiac arrest (HCC)   . HLD (hyperlipidemia)   . TMJ (dislocation of temporomandibular joint)   . Syncope   . Obesity   . Chest pain      Allergies  Allergen Reactions  . Benzocaine Anaphylaxis and Other (See Comments)    REACTION: Blistering all over and throat swells shut  . Orange Juice [Orange Oil] Anaphylaxis  . Grapefruit Concentrate      Current Outpatient Prescriptions  Medication Sig Dispense Refill  . aspirin 81 MG tablet Take 81 mg by mouth daily.    Marland Kitchen. atorvastatin (LIPITOR) 40 MG tablet Take 1 tablet (40 mg total) by mouth daily. 30 tablet 3  . canagliflozin (INVOKANA) 100 MG TABS tablet Take 1 tablet (100 mg total) by mouth daily before breakfast. 30 tablet 3  . insulin regular (NOVOLIN R,HUMULIN R) 100 units/mL injection Inject 0.2 mLs (20 Units total) into the skin 2 (two) times daily before a meal. With breakfast and with supper only if blood glucose is above 90 md/dl. 10 mL 2  . INSULIN  SYRINGE .5CC/29G 29G X 1/2" 0.5 ML MISC Use 2 times a day 100 each 2  . levothyroxine (SYNTHROID, LEVOTHROID) 75 MCG tablet Take 1 tablet (75 mcg total) by mouth daily before breakfast. 30 tablet 3  . lisinopril (PRINIVIL,ZESTRIL) 20 MG tablet Take 1 tablet (20 mg total) by mouth daily. 90 tablet 3  . metFORMIN (GLUCOPHAGE) 1000 MG tablet Take 1 tablet (1,000 mg total) by mouth 2 (two) times daily with a meal. 60 tablet 2  . niacin (NIASPAN) 750 MG CR tablet Take 1 tablet (750 mg total) by mouth at bedtime. 30 tablet 2  . omega-3 acid ethyl esters (LOVAZA) 1 G capsule Take 2 capsules (2 g total) by mouth 2 (two) times daily. 120 capsule 3  . predniSONE (DELTASONE) 10 MG tablet Take 4 tablets (40 mg total) by mouth daily. 20 tablet 0   No current facility-administered medications for this visit.     Past Surgical History  Procedure Laterality Date  . Cardiac catheterization       Allergies  Allergen Reactions  . Benzocaine Anaphylaxis and Other (See Comments)    REACTION: Blistering all over and throat swells shut  . Orange Juice [Orange Oil] Anaphylaxis  . Grapefruit Concentrate       Family History  Problem Relation Age of Onset  . Heart failure Mother   .  Heart attack Father     x5      Social History Mr. Kahre reports that he quit smoking about 6 years ago. His smoking use included Cigarettes. He quit after 6 years of use. He does not have any smokeless tobacco history on file. Mr. Prokop reports that he drinks about 2.4 oz of alcohol per week.   Review of Systems CONSTITUTIONAL: No weight loss, fever, chills, weakness or fatigue.  HEENT: Eyes: No visual loss, blurred vision, double vision or yellow sclerae.No hearing loss, sneezing, congestion, runny nose or sore throat.  SKIN: No rash or itching.  CARDIOVASCULAR: per hpi RESPIRATORY: No shortness of breath, cough or sputum.  GASTROINTESTINAL: No anorexia, nausea, vomiting or diarrhea. No abdominal pain or blood.   GENITOURINARY: No burning on urination, no polyuria NEUROLOGICAL: No headache, dizziness, syncope, paralysis, ataxia, numbness or tingling in the extremities. No change in bowel or bladder control.  MUSCULOSKELETAL: No muscle, back pain, joint pain or stiffness.  LYMPHATICS: No enlarged nodes. No history of splenectomy.  PSYCHIATRIC: No history of depression or anxiety.  ENDOCRINOLOGIC: No reports of sweating, cold or heat intolerance. No polyuria or polydipsia.  Marland Kitchen   Physical Examination Filed Vitals:   12/26/14 1128  BP: 140/76  Pulse: 88   Filed Vitals:   12/26/14 1128  Height:  (1.803 m)  Weight: 232 lb (105.235 kg)    Gen: resting comfortably, no acute distress HEENT: no scleral icterus, pupils equal round and reactive, no palptable cervical adenopathy,  CV: RRR, no m/r/g, no jvd Resp: Clear to auscultation bilaterally GI: abdomen is soft, non-tender, non-distended, normal bowel sounds, no hepatosplenomegaly MSK: extremities are warm, no edema.  Skin: warm, no rash Neuro:  no focal deficits Psych: appropriate affect   Diagnostic Studies 07/2012 cath Hemodynamics:  AO 125/86 LV 128/18  Coronary angiography:   Coronary dominance: Right  Left mainstem: Short and normal.   Left anterior descending (LAD): Large and wrapping the apex. Normal. Diagonals x 3 small and normal.  Left circumflex (LCx): AV groove normal. MOM large and normal. RI very large normal.   Right coronary artery (RCA): Dominant but not a particularl large vessel. Catheter induced coronary spasm. PDA small and normal.   Left ventriculography: Left ventricular systolic function is normal, LVEF is estimated at 65%, there is no significant mitral regurgitation   Final Conclusions: Normal coronaries. NL LV function.     06/2012 echo: LVEF 55-60%, borderline diastolic  function    Assessment and Plan  1. Palpitations/Presyncope - symptoms suggestive of symptomatic arrhythmia - orthostatics negative in clinic - will order 2 week event monitor - request labs from pcp  F/u 1 month      Antoine Poche, M.D.

## 2014-12-26 NOTE — Patient Instructions (Signed)
Your physician recommends that you schedule a follow-up appointment in: 1 MONTH   Your physician has recommended that you wear an event monitor. Event monitors are medical devices that record the heart's electrical activity. Doctors most often us these monitors to diagnose arrhythmias. Arrhythmias are problems with the speed or rhythm of the heartbeat. The monitor is a small, portable device. You can wear one while you do your normal daily activities. This is usually used to diagnose what is causing palpitations/syncope (passing out).      Your physician recommends that you continue on your current medications as directed. Please refer to the Current Medication list given to you today.     If you need a refill on your cardiac medications before your next appointment, please call your pharmacy.        Thank you for choosing [facility] !

## 2014-12-30 ENCOUNTER — Encounter (INDEPENDENT_AMBULATORY_CARE_PROVIDER_SITE_OTHER): Payer: 59

## 2014-12-30 DIAGNOSIS — R002 Palpitations: Secondary | ICD-10-CM | POA: Diagnosis not present

## 2015-01-16 ENCOUNTER — Encounter: Payer: Self-pay | Admitting: "Endocrinology

## 2015-01-16 ENCOUNTER — Ambulatory Visit: Payer: 59 | Admitting: Nutrition

## 2015-02-03 ENCOUNTER — Telehealth: Payer: Self-pay | Admitting: *Deleted

## 2015-02-03 NOTE — Telephone Encounter (Signed)
Pt aware, confirmed 1/13 OV with KL. Routed to pcp

## 2015-02-03 NOTE — Telephone Encounter (Signed)
-----   Message from Antoine PocheJonathan F Branch, MD sent at 02/02/2015  3:23 PM EST ----- Event monitor looks good, no significant abnormal rhythms. Will discuss in further detail at follow up  Dominga FerryJ Branch MD

## 2015-02-06 ENCOUNTER — Ambulatory Visit (INDEPENDENT_AMBULATORY_CARE_PROVIDER_SITE_OTHER): Payer: 59 | Admitting: Adult Health

## 2015-02-06 ENCOUNTER — Encounter: Payer: Self-pay | Admitting: Adult Health

## 2015-02-06 ENCOUNTER — Ambulatory Visit: Payer: 59 | Admitting: Nutrition

## 2015-02-06 VITALS — BP 170/120 | HR 91 | Ht 71.0 in | Wt 234.0 lb

## 2015-02-06 DIAGNOSIS — G458 Other transient cerebral ischemic attacks and related syndromes: Secondary | ICD-10-CM

## 2015-02-06 DIAGNOSIS — I10 Essential (primary) hypertension: Secondary | ICD-10-CM

## 2015-02-06 NOTE — Progress Notes (Deleted)
Name: Nathan [name]    DOB: Jun 25, 1975  Age: 40 y.o.  MR#: [id number]       PCP:  Collene Mares, PA-C      Insurance: Payor: [employer] / Plan: [employer] OTHER / Product Type: *No Product type* /   CC:   No chief complaint on file.   VS Filed Vitals:   02/06/15 1328  BP: 170/120  Pulse: 91  Height: '5\' 11"'  (1.803 m)  Weight: 234 lb (106.142 kg)  SpO2: 97%    Weights Current Weight  02/06/15 234 lb (106.142 kg)  12/26/14 232 lb (105.235 kg)  12/12/14 236 lb 6.4 oz (107.23 kg)    Blood Pressure  BP Readings from Last 3 Encounters:  02/06/15 170/120  12/26/14 140/76  12/12/14 149/90     Admit date:  (Not on file) Last encounter with RMR:  Visit date not found   Allergy Benzocaine; Orange juice; and Grapefruit concentrate  Current Outpatient Prescriptions  Medication Sig Dispense Refill  . aspirin 81 MG tablet Take 81 mg by mouth daily.    Marland Kitchen atorvastatin (LIPITOR) 40 MG tablet Take 1 tablet (40 mg total) by mouth daily. 30 tablet 3  . canagliflozin (INVOKANA) 100 MG TABS tablet Take 1 tablet (100 mg total) by mouth daily before breakfast. 30 tablet 3  . levothyroxine (SYNTHROID, LEVOTHROID) 75 MCG tablet Take 1 tablet (75 mcg total) by mouth daily before breakfast. 30 tablet 3  . lisinopril (PRINIVIL,ZESTRIL) 20 MG tablet Take 1 tablet (20 mg total) by mouth daily. 90 tablet 3  . metFORMIN (GLUCOPHAGE) 1000 MG tablet Take 1 tablet (1,000 mg total) by mouth 2 (two) times daily with a meal. 60 tablet 2   No current facility-administered medications for this visit.    Discontinued Meds:   There are no discontinued medications.  Patient Active Problem List   Diagnosis Date Noted  . Type 2 diabetes mellitus with vascular disease ([place]) 12/12/2014  . Primary hypothyroidism 11/03/2014  . Hyperlipidemia 11/03/2014  . Chest pain   . Hypertension   . HLD (hyperlipidemia)     LABS    Component Value Date/Time   NA 136 09/13/2011 1922   NA 135 07/03/2010  0420   NA 140 07/02/2010 0529   K 3.8 09/13/2011 1922   K 3.4* 07/03/2010 0420   K 3.5 07/02/2010 0529   CL 97 09/13/2011 1922   CL 98 07/03/2010 0420   CL 101 07/02/2010 0529   CO2 29 09/13/2011 1922   CO2 30 07/03/2010 0420   CO2 30 07/02/2010 0529   GLUCOSE 173* 08/14/2012 1024   GLUCOSE 169* 09/13/2011 1922   GLUCOSE 215* 07/03/2010 0420   BUN 8 09/13/2011 1922   BUN 8 07/03/2010 0420   BUN 7 07/02/2010 0529   CREATININE 0.72 09/13/2011 1922   CREATININE 0.58 07/03/2010 0420   CREATININE 0.62 07/02/2010 0529   CALCIUM 10.2 09/13/2011 1922   CALCIUM 10.2 07/03/2010 0420   CALCIUM 10.2 07/02/2010 0529   GFRNONAA >90 09/13/2011 1922   GFRNONAA >60 07/03/2010 0420   GFRNONAA >60 07/02/2010 0529   GFRAA >90 09/13/2011 1922   GFRAA >60 07/03/2010 0420   GFRAA  07/02/2010 0529    >60        The eGFR has been calculated using the MDRD equation. This calculation has not been validated in all clinical situations. eGFR's persistently <60 mL/min signify possible Chronic Kidney Disease.   CMP     Component Value Date/Time   NA  136 09/13/2011 1922   K 3.8 09/13/2011 1922   CL 97 09/13/2011 1922   CO2 29 09/13/2011 1922   GLUCOSE 173* 08/14/2012 1024   BUN 8 09/13/2011 1922   CREATININE 0.72 09/13/2011 1922   CALCIUM 10.2 09/13/2011 1922   GFRNONAA >90 09/13/2011 1922   GFRAA >90 09/13/2011 1922       Component Value Date/Time   WBC 7.5 09/13/2011 1922   WBC 7.8 07/02/2010 0529   WBC 6.5 07/01/2010 1544   HGB 13.8 09/13/2011 1922   HGB 15.7 07/02/2010 0529   HGB 14.3 07/01/2010 1544   HCT 39.7 09/13/2011 1922   HCT 45.3 07/02/2010 0529   HCT 41.1 07/01/2010 1544   MCV 87.3 09/13/2011 1922   MCV 88.0 07/02/2010 0529   MCV 86.5 07/01/2010 1544    Lipid Panel     Component Value Date/Time   CHOL 218* 07/01/2010 0507   TRIG 440* 07/01/2010 0507   HDL 30* 07/01/2010 0507   CHOLHDL 7.3 07/01/2010 0507   VLDL UNABLE TO CALCULATE IF TRIGLYCERIDE OVER 400 mg/dL  07/01/2010 0507   LDLCALC  07/01/2010 0507    UNABLE TO CALCULATE IF TRIGLYCERIDE OVER 400 mg/dL        Total Cholesterol/HDL:CHD Risk Coronary Heart Disease Risk Table                     Men   Women  1/2 Average Risk   3.4   3.3  Average Risk       5.0   4.4  2 X Average Risk   9.6   7.1  3 X Average Risk  23.4   11.0        Use the calculated Patient Ratio above and the CHD Risk Table to determine the patient's CHD Risk.        ATP III CLASSIFICATION (LDL):  <100     mg/dL   Optimal  100-129  mg/dL   Near or Above                    Optimal  130-159  mg/dL   Borderline  160-189  mg/dL   High  >190     mg/dL   Very High    ABG No results found for: PHART, PCO2ART, PO2ART, HCO3, TCO2, ACIDBASEDEF, O2SAT   Lab Results  Component Value Date   TSH 2.794 07/01/2010   BNP (last 3 results) No results for input(s): BNP in the last 8760 hours.  ProBNP (last 3 results) No results for input(s): PROBNP in the last 8760 hours.  Cardiac Panel (last 3 results) No results for input(s): CKTOTAL, CKMB, TROPONINI, RELINDX in the last 72 hours.  Iron/TIBC/Ferritin/ %Sat No results found for: IRON, TIBC, FERRITIN, IRONPCTSAT   EKG Orders placed or performed in visit on 12/26/14  . EKG 12-Lead     Prior Assessment and Plan Problem List as of 02/06/2015      Cardiovascular and Mediastinum   Hypertension   Type 2 diabetes mellitus with vascular disease [facility])     Endocrine   Primary hypothyroidism     Other   Chest pain   Last Assessment & Plan 09/14/2012 Office Visit Written 09/14/2012  3:33 PM by Donney Dice, PA-C    We reviewed the results of the recent normal coronary angiogram, which provided considerable reassurance to the patient and his wife. He has renewed energy, and has not had any recurrent CP. He is to resume  followup with his primary care team for ongoing aggressive CRF monitoring and management, and is to return to Dr. Bronson Ing on an as-needed basis.      HLD  (hyperlipidemia)   Hyperlipidemia       Imaging: No results found.

## 2015-02-06 NOTE — Patient Instructions (Signed)
Your physician recommends that you schedule a follow-up appointment with Joni ReiningKathryn Lawrence, NP or Dr. Wyline MoodBranch after test  Your physician recommends that you continue on your current medications as directed. Please refer to the Current Medication list given to you today.  Your physician has requested that you have a renal artery duplex. During this test, an ultrasound is used to evaluate blood flow to the kidneys. Allow one hour for this exam. Do not eat after midnight the day before and avoid carbonated beverages. Take your medications as you usually do.  If you need a refill on your cardiac medications before your next appointment, please call your pharmacy.  Thank you for choosing [facility]!

## 2015-02-06 NOTE — Addendum Note (Signed)
Addended by: Kerney ElbePINNIX, LUKISHA G on: 02/06/2015 02:55 PM   Modules accepted: Orders

## 2015-02-06 NOTE — Progress Notes (Signed)
. Cardiology Office Note   Date:  02/06/2015   ID:  Nathan [name], DOB 1975/11/04, MRN [id number]  PCP:  Lenise Herald, PA-C  Cardiologist: Arlington Calix, NP   Chief Complaint  Patient presents with  . Hypertension      History of Present Illness: Nathan [name] is a 40 y.o. male who presents for ongoing assessment and management of atypical chest pain, presyncope with palpitations. A cardiac monitor was placed on last office visit. On review of telemetry by Dr. Wyline Mood, there was no significantly abnormal rhythms on 02/01/2014.   Event monitor tracings show sinus rhythm  No significant arrhythmias  Reported symptoms correspond with normal sinus rhythm  He continues to have issues with labile BP and dizziness. He notices it when he is stooping and also when he exercises. He feels dizzy. No palpitations.   Past Medical History  Diagnosis Date  . Pneumonia   . Diabetes mellitus   . Hypertension   . Cardiac arrest (HCC)   . HLD (hyperlipidemia)   . TMJ (dislocation of temporomandibular joint)   . Syncope   . Obesity   . Chest pain     Past Surgical History  Procedure Laterality Date  . Cardiac catheterization       Current Outpatient Prescriptions  Medication Sig Dispense Refill  . aspirin 81 MG tablet Take 81 mg by mouth daily.    Marland Kitchen atorvastatin (LIPITOR) 40 MG tablet Take 1 tablet (40 mg total) by mouth daily. 30 tablet 3  . canagliflozin (INVOKANA) 100 MG TABS tablet Take 1 tablet (100 mg total) by mouth daily before breakfast. 30 tablet 3  . levothyroxine (SYNTHROID, LEVOTHROID) 75 MCG tablet Take 1 tablet (75 mcg total) by mouth daily before breakfast. 30 tablet 3  . lisinopril (PRINIVIL,ZESTRIL) 20 MG tablet Take 1 tablet (20 mg total) by mouth daily. 90 tablet 3  . metFORMIN (GLUCOPHAGE) 1000 MG tablet Take 1 tablet (1,000 mg total) by mouth 2 (two) times daily with a meal. 60 tablet 2   No current facility-administered medications for this  visit.    Allergies:   Benzocaine; Orange juice; and Grapefruit concentrate    Social History:  The patient  reports that he quit smoking about 7 years ago. His smoking use included Cigarettes. He quit after 6 years of use. He does not have any smokeless tobacco history on file. He reports that he drinks about 2.4 oz of alcohol per week. He reports that he does not use illicit drugs.   Family History:  The patient's family history includes Heart attack in his father; Heart failure in his mother.    ROS: All other systems are reviewed and negative. Unless otherwise mentioned in H&P    PHYSICAL EXAM: VS:  BP 170/120 mmHg  Pulse 91  Ht 5\' 11"  (1.803 m)  Wt 234 lb (106.142 kg)  BMI 32.65 kg/m2  SpO2 97% , BMI Body mass index is 32.65 kg/(m^2). GEN: Well nourished, well developed, in no acute distress HEENT: normal Neck: no JVD, carotid bruits, or masses Cardiac: RRR; no murmurs, rubs, or gallops,no edema  Respiratory:  clear to auscultation bilaterally, normal work of breathing GI: soft, nontender, nondistended, + BS MS: no deformity or atrophy Skin: warm and dry, no rash Neuro:  Strength and sensation are intact Psych: euthymic mood, full affect    Recent Labs: No results found for requested labs within last 365 days.    Lipid Panel    Component Value Date/Time  CHOL 218* 07/01/2010 0507   TRIG 440* 07/01/2010 0507   HDL 30* 07/01/2010 0507   CHOLHDL 7.3 07/01/2010 0507   VLDL UNABLE TO CALCULATE IF TRIGLYCERIDE OVER 400 mg/dL 16/10/960406/07/2010 54090507   LDLCALC  07/01/2010 0507    UNABLE TO CALCULATE IF TRIGLYCERIDE OVER 400 mg/dL        Total Cholesterol/HDL:CHD Risk Coronary Heart Disease Risk Table                     Men   Women  1/2 Average Risk   3.4   3.3  Average Risk       5.0   4.4  2 X Average Risk   9.6   7.1  3 X Average Risk  23.4   11.0        Use the calculated Patient Ratio above and the CHD Risk Table to determine the patient's CHD Risk.        ATP  III CLASSIFICATION (LDL):  <100     mg/dL   Optimal  [id number]  mg/dL   Near or Above                    Optimal  130-159  mg/dL   Borderline  [id number]  mg/dL   High  >213>190     mg/dL   Very High      Wt Readings from Last 3 Encounters:  02/06/15 234 lb (106.142 kg)  12/26/14 232 lb (105.235 kg)  12/12/14 236 lb 6.4 oz (107.23 kg)      ASSESSMENT AND PLAN:  1.  Hypertension: BP is checked in the exam room in both arms. The BP are mismatched. Right arm 168/90 left arm 132/60. I will have subclavian artery ultrasound completed to evaluate for subclavian steal syndrome. Also check for RAS as well with renal ultrasound. Will not change any medications for now. He will need to be careful when rising from sitting to standing position or squatting to avoid valsalva.   2. Palpitations. Occuring occasionally but not intrusive. He is advised to cut back on caffeine.   3, Thyroid disease.; He is followed by endocrinology for this as well as diabetes.    Current medicines are reviewed at length with the patient today.    Labs/ tests ordered today include: Subclavian artery ultrasound and Renal artery ultrasound.   Orders Placed This Encounter  Procedures  . US Extrem Up Bilat Ltd     Disposition:   FU with one month   Signed, Joni ReiningKathryn Lawrence, NP  02/06/2015 2:21 PM    [facility] [address], [place], [place] [address] Phone: [phone]; Fax: [phone]

## 2015-02-19 ENCOUNTER — Ambulatory Visit: Payer: 59

## 2015-02-19 DIAGNOSIS — G458 Other transient cerebral ischemic attacks and related syndromes: Secondary | ICD-10-CM

## 2015-02-19 DIAGNOSIS — I10 Essential (primary) hypertension: Secondary | ICD-10-CM

## 2015-02-27 ENCOUNTER — Other Ambulatory Visit: Payer: Self-pay

## 2015-02-27 ENCOUNTER — Encounter: Payer: 59 | Admitting: Adult Health

## 2015-02-27 DIAGNOSIS — G458 Other transient cerebral ischemic attacks and related syndromes: Secondary | ICD-10-CM

## 2015-02-27 NOTE — Progress Notes (Signed)
Cardiology Office Note   ERROR Rescheduled

## 2015-03-03 ENCOUNTER — Other Ambulatory Visit: Payer: Self-pay

## 2015-03-03 DIAGNOSIS — G458 Other transient cerebral ischemic attacks and related syndromes: Secondary | ICD-10-CM

## 2015-03-05 ENCOUNTER — Other Ambulatory Visit: Payer: Self-pay | Admitting: "Endocrinology

## 2015-03-06 ENCOUNTER — Ambulatory Visit (HOSPITAL_COMMUNITY)
Admission: RE | Admit: 2015-03-06 | Discharge: 2015-03-06 | Disposition: A | Discharge status: Home / Self Care | Payer: 59 | Source: Ambulatory Visit | Attending: Adult Health | Admitting: Adult Health

## 2015-03-06 ENCOUNTER — Other Ambulatory Visit (HOSPITAL_COMMUNITY): Payer: 59

## 2015-03-06 DIAGNOSIS — G458 Other transient cerebral ischemic attacks and related syndromes: Secondary | ICD-10-CM | POA: Diagnosis present

## 2015-03-10 ENCOUNTER — Other Ambulatory Visit (HOSPITAL_COMMUNITY): Payer: 59

## 2015-03-12 ENCOUNTER — Other Ambulatory Visit: Payer: Self-pay

## 2015-03-12 MED ORDER — CANAGLIFLOZIN 300 MG PO TABS: ORAL_TABLET | ORAL | Status: DC

## 2015-03-12 MED ORDER — METFORMIN HCL 1000 MG PO TABS: ORAL_TABLET | ORAL | Status: DC

## 2015-03-12 MED ORDER — LEVOTHYROXINE SODIUM 75 MCG PO TABS: 75.0000 ug | ORAL_TABLET | Freq: Every day | ORAL | Status: DC

## 2015-03-14 LAB — HEMOGLOBIN A1C: Hemoglobin A1C: 11

## 2015-03-20 ENCOUNTER — Encounter: Payer: Self-pay | Admitting: "Endocrinology

## 2015-03-20 ENCOUNTER — Ambulatory Visit (INDEPENDENT_AMBULATORY_CARE_PROVIDER_SITE_OTHER): Payer: 59 | Admitting: "Endocrinology

## 2015-03-20 VITALS — BP 143/86 | HR 96 | Ht 71.0 in | Wt 232.0 lb

## 2015-03-20 DIAGNOSIS — E039 Hypothyroidism, unspecified: Secondary | ICD-10-CM

## 2015-03-20 DIAGNOSIS — E1159 Type 2 diabetes mellitus with other circulatory complications: Secondary | ICD-10-CM

## 2015-03-20 DIAGNOSIS — E785 Hyperlipidemia, unspecified: Secondary | ICD-10-CM

## 2015-03-20 MED ORDER — INSULIN NPH ISOPHANE & REGULAR (70-30) 100 UNIT/ML ~~LOC~~ SUSP: 20.0000 [IU] | Freq: Two times a day (BID) | SUBCUTANEOUS | Status: DC

## 2015-03-20 MED ORDER — "INSULIN SYRINGE 29G X 1/2"" 0.5 ML MISC": Status: DC

## 2015-03-20 NOTE — Progress Notes (Signed)
Subjective:    Patient ID: Nathan [name], male    DOB: [birth year],  .MANN, [name], PA-C   Past Medical History  Diagnosis Date  . Pneumonia   . Diabetes mellitus   . Hypertension   . Cardiac arrest (HCC)   . HLD (hyperlipidemia)   . TMJ (dislocation of temporomandibular joint)   . Syncope   . Obesity   . Chest pain    Past Surgical History  Procedure Laterality Date  . Cardiac catheterization     Social History   Social History  . Marital Status: Married    Spouse Name: N/A  . Number of Children: N/A  . Years of Education: N/A   Social History Main Topics  . Smoking status: Former Smoker -- 6 years    Types: Cigarettes    Quit date: 01/25/2008  . Smokeless tobacco: None  . Alcohol Use: 2.4 oz/week    4 Cans of beer per week     Comment: 3-4 beers weekly  . Drug Use: No  . Sexual Activity: Not Asked   Other Topics Concern  . None   Social History Narrative   Outpatient Encounter Prescriptions as of 03/20/2015  Medication Sig  . aspirin 81 MG tablet Take 81 mg by mouth daily.  Marland Kitchen atorvastatin (LIPITOR) 40 MG tablet Take 1 tablet (40 mg total) by mouth daily.  . canagliflozin (INVOKANA) 300 MG TABS tablet TAKE ONE TABLET BY MOUTH IN THE MORNING BEFORE  BREAKFAST  . levothyroxine (SYNTHROID, LEVOTHROID) 75 MCG tablet Take 1 tablet (75 mcg total) by mouth daily before breakfast.  . metFORMIN (GLUCOPHAGE) 1000 MG tablet TAKE ONE TABLET BY MOUTH TWICE DAILY WITH  A  MEAL  . insulin NPH-regular Human (NOVOLIN 70/30) (70-30) 100 UNIT/ML injection Inject 20 Units into the skin 2 (two) times daily with a meal.  . INSULIN SYRINGE .5CC/29G 29G X 1/2" 0.5 ML MISC Use to inject insulin 2 x a day  . [DISCONTINUED] lisinopril (PRINIVIL,ZESTRIL) 20 MG tablet Take 1 tablet (20 mg total) by mouth daily.   No facility-administered encounter medications on file as of 03/20/2015.   ALLERGIES: Allergies  Allergen Reactions  . Benzocaine Anaphylaxis and Other (See  Comments)    REACTION: Blistering all over and throat swells shut  . Orange Juice [Orange Oil] Anaphylaxis  . Grapefruit Concentrate    VACCINATION STATUS:  There is no immunization history on file for this patient.  Diabetes He presents for his follow-up diabetic visit. He has type 2 diabetes mellitus. Onset time: He was diagnosed with type 2 diabetes at approximate age of 33 years. His disease course has been worsening. There are no hypoglycemic associated symptoms. Pertinent negatives for hypoglycemia include no confusion, headaches, pallor or seizures. There are no diabetic associated symptoms. Pertinent negatives for diabetes include no chest pain, no fatigue, no polydipsia, no polyphagia, no polyuria and no weakness. There are no hypoglycemic complications. Symptoms are worsening. Diabetic complications include heart disease. Risk factors for coronary artery disease include dyslipidemia, family history, diabetes mellitus, obesity, male sex and sedentary lifestyle. Current diabetic treatment includes oral agent (dual therapy) (He was supposed to pick up the prescription for Novolin 70/30 to take along with his metformin, invokana, and tanzeum ; however he  he did not take His insulin.). He is compliant with treatment some of the time. His weight is stable. He is following a generally unhealthy diet. He has had a previous visit with a dietitian. He participates in exercise intermittently. Home  blood sugar record trend: He came with no meter nor logs to review. He did not start the insulin prescribed last visit for him. An ACE inhibitor/angiotensin II receptor blocker is not being taken. Eye exam is current.  Hyperglycemia This is a chronic problem. The current episode started more than 1 year ago. Pertinent negatives include no abdominal pain, chest pain, coughing, fatigue, headaches, myalgias, nausea, neck pain, numbness, rash, vomiting or weakness. Treatments tried: He was supposed to be on Lovaza,  Lipitor. However he is not on Lovaza for unclear reasons.  Hypertension This is a chronic problem. The current episode started more than 1 year ago. The problem is uncontrolled. Pertinent negatives include no chest pain, headaches, neck pain, palpitations or shortness of breath. Past treatments include nothing. Hypertensive end-organ damage includes a thyroid problem.  Hyperlipidemia This is a chronic problem. The current episode started more than 1 year ago. The problem is uncontrolled. Recent lipid tests were reviewed and are high. Exacerbating diseases include diabetes, hypothyroidism and obesity. Exacerbated by: Medical noncompliance. Pertinent negatives include no chest pain, myalgias or shortness of breath. Treatments tried: Was supposed to be on atorvastatin and Niaspan, liver patient did not take these medications for the last 2 months. Compliance problems include adherence to diet and medication cost.  Risk factors for coronary artery disease include dyslipidemia, family history, diabetes mellitus, male sex, obesity and a sedentary lifestyle.  Thyroid Problem Presents for follow-up visit. Patient reports no constipation, diarrhea, fatigue or palpitations. The symptoms have been stable. Past treatments include levothyroxine. His past medical history is significant for diabetes and hyperlipidemia.     Review of Systems  Constitutional: Positive for unexpected weight change. Negative for fatigue.  HENT: Negative for dental problem, mouth sores and trouble swallowing.   Eyes: Negative for visual disturbance.  Respiratory: Negative for cough, choking, chest tightness, shortness of breath and wheezing.   Cardiovascular: Negative for chest pain, palpitations and leg swelling.  Gastrointestinal: Negative for nausea, vomiting, abdominal pain, diarrhea, constipation and abdominal distention.  Endocrine: Negative for polydipsia, polyphagia and polyuria.  Genitourinary: Negative for dysuria, urgency,  hematuria and flank pain.  Musculoskeletal: Negative for myalgias, back pain, gait problem and neck pain.  Skin: Negative for pallor, rash and wound.  Neurological: Negative for seizures, syncope, weakness, numbness and headaches.  Psychiatric/Behavioral: Negative.  Negative for confusion and dysphoric mood.    Objective:    BP 143/86 mmHg  Pulse 96  Ht  (1.803 m)  Wt 232 lb (105.235 kg)  BMI 32.37 kg/m2  SpO2 97%  Wt Readings from Last 3 Encounters:  03/20/15 232 lb (105.235 kg)  02/06/15 234 lb (106.142 kg)  12/26/14 232 lb (105.235 kg)    Physical Exam  Constitutional: He is oriented to person, place, and time. He appears well-developed and well-nourished. He is cooperative. No distress.  HENT:  Head: Normocephalic and atraumatic.  Eyes: EOM are normal.  Neck: Normal range of motion. Neck supple. No tracheal deviation present. No thyromegaly present.  Cardiovascular: Normal rate, S1 normal, S2 normal and normal heart sounds.  Exam reveals no gallop.   No murmur heard. Pulses:      Dorsalis pedis pulses are 1+ on the right side, and 1+ on the left side.       Posterior tibial pulses are 1+ on the right side, and 1+ on the left side.  Pulmonary/Chest: Breath sounds normal. No respiratory distress. He has no wheezes.  Abdominal: Soft. Bowel sounds are normal. He exhibits no  distension. There is no tenderness. There is no guarding and no CVA tenderness.  Musculoskeletal: He exhibits no edema.       Right shoulder: He exhibits no swelling and no deformity.  Neurological: He is alert and oriented to person, place, and time. He has normal strength and normal reflexes. No cranial nerve deficit or sensory deficit. Gait normal.  Skin: Skin is warm and dry. No rash noted. No cyanosis. Nails show no clubbing.  Psychiatric: He has a normal mood and affect. His speech is normal and behavior is normal. Judgment and thought content normal. Cognition and memory are normal.     Chemistry (most recent): Lab Results  Component Value Date   NA 136 09/13/2011   K 3.8 09/13/2011   CL 97 09/13/2011   CO2 29 09/13/2011   BUN 8 09/13/2011   CREATININE 0.72 09/13/2011   Diabetic Labs (most recent): Lab Results  Component Value Date   HGBA1C 11 03/14/2015   HGBA1C 11.1 12/18/2014   HGBA1C 12.1* 07/02/2010   Lipid Panel     Component Value Date/Time   CHOL 218* 07/01/2010 0507   TRIG 440* 07/01/2010 0507   HDL 30* 07/01/2010 0507   CHOLHDL 7.3 07/01/2010 0507   VLDL UNABLE TO CALCULATE IF TRIGLYCERIDE OVER 400 mg/dL 16/10/9602 5409   LDLCALC  07/01/2010 0507    UNABLE TO CALCULATE IF TRIGLYCERIDE OVER 400 mg/dL        Total Cholesterol/HDL:CHD Risk Coronary Heart Disease Risk Table                     Men   Women  1/2 Average Risk   3.4   3.3  Average Risk       5.0   4.4  2 X Average Risk   9.6   7.1  3 X Average Risk  23.4   11.0        Use the calculated Patient Ratio above and the CHD Risk Table to determine the patient's CHD Risk.        ATP III CLASSIFICATION (LDL):  <100     mg/dL   Optimal  811-914  mg/dL   Near or Above                    Optimal  130-159  mg/dL   Borderline  782-956  mg/dL   High  >213     mg/dL   Very High     Assessment & Plan:   1. Uncontrolled type 2 diabetes mellitus with hyperosmolarity without coma, without long-term current use of insulin (HCC)  His diabetes is  complicated by severe hypertriglyceridemia and nonadherence to medication. Patient came with unchanged A1c of 11 % , consistent with severely uncontrolled diabetes.  This is mainly because he did not start the insulin prescribed for him last visit.   Recent labs reviewed. - Patient remains at a high risk for more acute and chronic complications of diabetes which include CAD, CVA, CKD, retinopathy, and neuropathy. These are all discussed in detail with the patient.  - I have re-counseled the patient on diet management and weight loss  by adopting  a carbohydrate restricted / protein rich  Diet. - Patient is advised to stick to a routine mealtimes to eat 3 meals  a day and avoid unnecessary snacks ( to snack only to correct hypoglycemia).  - Suggestion is made for patient to avoid simple carbohydrates   from their diet including Cakes ,  Desserts, Ice Cream,  Soda (  diet and regular) , Sweet Tea , Candies,  Chips, Cookies, Artificial Sweeteners,   and "Sugar-free" Products .  This will help patient to have stable blood glucose profile and potentially avoid unintended  Weight gain.  - The patient  will be  scheduled with Norm Salt, RDN, CDE for individualized DM education. - I have approached patient with the following individualized plan to manage diabetes and patient agrees.  - Patient remains alarmingly reluctant and difficult to engage. - I have approached him again for insulin treatment and he reluctantly accepts. -I  urged him to resume  premixed insulin Novolin 70/30  20 units with breakfast and 20 units with supper for pre-meal glucose of greater than 90 mg/DL associated with strict monitoring of glucose  AC and HS. these choice is because he cannot afford the insulin analogs. - Patient is warned not to take insulin without proper monitoring per orders. -Adjustment parameters are given for hypo and hyperglycemia in writing. -Patient is encouraged to call clinic for blood glucose levels less than 70 or above 300 mg /dl. - I discontinued  tanzeum   due to high risk of pancreatitis from severe hypertriglyceridemia at 1660 . - I will continue metformin 1000 mg by mouth twice a day, and Invokana 300 mg by mouth every morning, therapeutically suitable for patient.  - Patient specific target  for A1c; LDL, HDL, Triglycerides, and  Waist Circumference were discussed in detail.  2) BP/HTN: Better controlled this visit than last one. I have added lisinopril 20 mg by mouth every morning. He is counseled on the need to control hypertension  to prevent complications.   3) Lipids/HPL: Recent lipid panel is reviewed with him showing triglycerides extremity elevated at 1660 mg per DL. I urged him to resume atorvastatin 40 mg by mouth daily at bedtime ,   and Niaspan 750 mg extended release once a day. unfortunately Lovaza is not covered by his insurance .  4)  obesity:  Agrees to CDE consult, exercise, and carbohydrates information provided.  5) hypothyroidism: He is advised to continue levothyroxine 75 g by mouth every morning.  - We discussed about correct intake of levothyroxine, at fasting, with water, separated by at least 30 minutes from breakfast, and separated by more than 4 hours from calcium, iron, multivitamins, acid reflux medications (PPIs). -Patient is made aware of the fact that thyroid hormone replacement is needed for life, dose to be adjusted by periodic monitoring of thyroid function tests.  6) Chronic Care/Health Maintenance:  -Patient is on  Statin medications and encouraged to continue to follow up with Ophthalmology, Podiatrist at least yearly or according to recommendations, and advised to stay away from smoking. I have recommended yearly flu vaccine and pneumonia vaccination at least every 5 years; moderate intensity exercise for up to 150 minutes weekly; and  sleep for at least 7 hours a day. 6) hypothyroidism: He would benefit from a slight increase in his levothyroxine dose. I would increase levothyroxine to 75 g by mouth every morning. Appropriate intake of levothyroxine discussed with the patient.  - 30 minutes of time was spent on the care of this patient , 50% of which was applied for counseling on diabetes complications and their preventions.  I advised patient to maintain close follow up with their PCP for primary care needs.  Patient is asked to bring meter and  blood glucose logs during their next visit.   Follow up plan: Return in about  2 weeks (around 04/03/2015) for diabetes, high blood pressure,  high cholesterol, follow up with meter and logs- no labs.  Marquis Lunch, MD Phone: [phone]  Fax: [phone]   03/20/2015, 11:30 AM

## 2015-03-20 NOTE — Patient Instructions (Signed)

## 2015-03-26 ENCOUNTER — Encounter: Payer: Self-pay | Admitting: "Endocrinology

## 2015-04-03 ENCOUNTER — Ambulatory Visit: Payer: 59 | Admitting: Nutrition

## 2015-04-03 ENCOUNTER — Encounter: Payer: Self-pay | Admitting: "Endocrinology

## 2015-04-03 ENCOUNTER — Ambulatory Visit (INDEPENDENT_AMBULATORY_CARE_PROVIDER_SITE_OTHER): Payer: 59 | Admitting: "Endocrinology

## 2015-04-03 ENCOUNTER — Encounter: Payer: 59 | Attending: "Endocrinology | Admitting: Nutrition

## 2015-04-03 ENCOUNTER — Encounter: Payer: Self-pay | Admitting: Nutrition

## 2015-04-03 VITALS — BP 138/85 | HR 83 | Ht 71.0 in | Wt 237.0 lb

## 2015-04-03 VITALS — Ht 71.0 in | Wt 235.0 lb

## 2015-04-03 DIAGNOSIS — E1100 Type 2 diabetes mellitus with hyperosmolarity without nonketotic hyperglycemic-hyperosmolar coma (NKHHC): Secondary | ICD-10-CM | POA: Insufficient documentation

## 2015-04-03 DIAGNOSIS — E1159 Type 2 diabetes mellitus with other circulatory complications: Secondary | ICD-10-CM | POA: Diagnosis not present

## 2015-04-03 DIAGNOSIS — I10 Essential (primary) hypertension: Secondary | ICD-10-CM | POA: Diagnosis not present

## 2015-04-03 DIAGNOSIS — E039 Hypothyroidism, unspecified: Secondary | ICD-10-CM | POA: Diagnosis not present

## 2015-04-03 DIAGNOSIS — E785 Hyperlipidemia, unspecified: Secondary | ICD-10-CM

## 2015-04-03 DIAGNOSIS — E1165 Type 2 diabetes mellitus with hyperglycemia: Secondary | ICD-10-CM

## 2015-04-03 DIAGNOSIS — E118 Type 2 diabetes mellitus with unspecified complications: Secondary | ICD-10-CM

## 2015-04-03 MED ORDER — INSULIN NPH ISOPHANE & REGULAR (70-30) 100 UNIT/ML ~~LOC~~ SUSP: 30.0000 [IU] | Freq: Two times a day (BID) | SUBCUTANEOUS | Status: DC

## 2015-04-03 NOTE — Patient Instructions (Signed)
Goals 1. Follow My Plate Method 2. Eat three balanced meals per day. 3, Increase low carb vegetables. 4. Take insulin as directed. 5. Exercise 30-60 minutes daily. 6. Get A1C down to 8% in three months. 7. Test blood sugars twice a day. 8. Do not skip meals and cut out snacks between meals 9. Eat 3 carb choices per meals

## 2015-04-03 NOTE — Patient Instructions (Signed)

## 2015-04-03 NOTE — Progress Notes (Signed)
OPened in error.

## 2015-04-03 NOTE — Progress Notes (Signed)
Subjective:    Patient ID: Nathan [name], male    DOB: 1975/07/22,  .Nathan, [name], PA-C   Past Medical History  Diagnosis Date  . Pneumonia   . Diabetes mellitus   . Hypertension   . Cardiac arrest (HCC)   . HLD (hyperlipidemia)   . TMJ (dislocation of temporomandibular joint)   . Syncope   . Obesity   . Chest pain    Past Surgical History  Procedure Laterality Date  . Cardiac catheterization     Social History   Social History  . Marital Status: Married    Spouse Name: N/A  . Number of Children: N/A  . Years of Education: N/A   Social History Main Topics  . Smoking status: Former Smoker -- 6 years    Types: Cigarettes    Quit date: 01/25/2008  . Smokeless tobacco: None  . Alcohol Use: 2.4 oz/week    4 Cans of beer per week     Comment: 3-4 beers weekly  . Drug Use: No  . Sexual Activity: Not Asked   Other Topics Concern  . None   Social History Narrative   Outpatient Encounter Prescriptions as of 04/03/2015  Medication Sig  . aspirin 81 MG tablet Take 81 mg by mouth daily.  Marland Kitchen atorvastatin (LIPITOR) 40 MG tablet Take 1 tablet (40 mg total) by mouth daily.  . canagliflozin (INVOKANA) 300 MG TABS tablet TAKE ONE TABLET BY MOUTH IN THE MORNING BEFORE  BREAKFAST  . insulin NPH-regular Human (NOVOLIN 70/30) (70-30) 100 UNIT/ML injection Inject 30 Units into the skin 2 (two) times daily with a meal.  . INSULIN SYRINGE .5CC/29G 29G X 1/2" 0.5 ML MISC Use to inject insulin 2 x a day  . levothyroxine (SYNTHROID, LEVOTHROID) 75 MCG tablet Take 1 tablet (75 mcg total) by mouth daily before breakfast.  . metFORMIN (GLUCOPHAGE) 1000 MG tablet TAKE ONE TABLET BY MOUTH TWICE DAILY WITH  A  MEAL  . [DISCONTINUED] insulin NPH-regular Human (NOVOLIN 70/30) (70-30) 100 UNIT/ML injection Inject 20 Units into the skin 2 (two) times daily with a meal.   No facility-administered encounter medications on file as of 04/03/2015.   ALLERGIES: Allergies  Allergen Reactions   . Benzocaine Anaphylaxis and Other (See Comments)    REACTION: Blistering all over and throat swells shut  . Orange Juice [Orange Oil] Anaphylaxis  . Grapefruit Concentrate    VACCINATION STATUS:  There is no immunization history on file for this patient.  Diabetes He presents for his follow-up diabetic visit. He has type 2 diabetes mellitus. Onset time: He was diagnosed with type 2 diabetes at approximate age of 33 years. His disease course has been improving. There are no hypoglycemic associated symptoms. Pertinent negatives for hypoglycemia include no confusion, headaches, pallor or seizures. There are no diabetic associated symptoms. Pertinent negatives for diabetes include no chest pain, no fatigue, no polydipsia, no polyphagia, no polyuria and no weakness. There are no hypoglycemic complications. Symptoms are improving. Diabetic complications include heart disease. Risk factors for coronary artery disease include dyslipidemia, family history, diabetes mellitus, obesity, male sex and sedentary lifestyle. Current diabetic treatment includes oral agent (dual therapy) (He was supposed to pick up the prescription for Novolin 70/30 to take along with his metformin, invokana, and tanzeum ; however he  he did not take His insulin.). He is compliant with treatment most of the time. His weight is stable. He is following a generally unhealthy diet. He has had a previous visit with  a dietitian. He participates in exercise intermittently. Home blood sugar record trend: He came with  logs  showing better BG profile. His breakfast blood glucose range is generally 180-200 mg/dl. His lunch blood glucose range is generally 180-200 mg/dl. His dinner blood glucose range is generally 180-200 mg/dl. An ACE inhibitor/angiotensin II receptor blocker is not being taken. Eye exam is current.  Hyperglycemia This is a chronic problem. The current episode started more than 1 year ago. Pertinent negatives include no abdominal  pain, chest pain, coughing, fatigue, headaches, myalgias, nausea, neck pain, numbness, rash, vomiting or weakness. Treatments tried: He was supposed to be on Lovaza, Lipitor. However he is not on Lovaza for unclear reasons.  Hypertension This is a chronic problem. The current episode started more than 1 year ago. The problem is uncontrolled. Pertinent negatives include no chest pain, headaches, neck pain, palpitations or shortness of breath. Past treatments include nothing. Hypertensive end-organ damage includes a thyroid problem.  Hyperlipidemia This is a chronic problem. The current episode started more than 1 year ago. The problem is uncontrolled. Recent lipid tests were reviewed and are high. Exacerbating diseases include diabetes, hypothyroidism and obesity. Exacerbated by: Medical noncompliance. Pertinent negatives include no chest pain, myalgias or shortness of breath. Treatments tried: Was supposed to be on atorvastatin and Niaspan, liver patient did not take these medications for the last 2 months. Compliance problems include adherence to diet and medication cost.  Risk factors for coronary artery disease include dyslipidemia, family history, diabetes mellitus, male sex, obesity and a sedentary lifestyle.  Thyroid Problem Presents for follow-up visit. Patient reports no constipation, diarrhea, fatigue or palpitations. The symptoms have been stable. Past treatments include levothyroxine. His past medical history is significant for diabetes and hyperlipidemia.     Review of Systems  Constitutional: Positive for unexpected weight change. Negative for fatigue.  HENT: Negative for dental problem, mouth sores and trouble swallowing.   Eyes: Negative for visual disturbance.  Respiratory: Negative for cough, choking, chest tightness, shortness of breath and wheezing.   Cardiovascular: Negative for chest pain, palpitations and leg swelling.  Gastrointestinal: Negative for nausea, vomiting, abdominal  pain, diarrhea, constipation and abdominal distention.  Endocrine: Negative for polydipsia, polyphagia and polyuria.  Genitourinary: Negative for dysuria, urgency, hematuria and flank pain.  Musculoskeletal: Negative for myalgias, back pain, gait problem and neck pain.  Skin: Negative for pallor, rash and wound.  Neurological: Negative for seizures, syncope, weakness, numbness and headaches.  Psychiatric/Behavioral: Negative.  Negative for confusion and dysphoric mood.    Objective:    BP 138/85 mmHg  Pulse 83  Ht 5\' 11"  (1.803 m)  Wt 237 lb (107.502 kg)  BMI 33.07 kg/m2  SpO2 97%  Wt Readings from Last 3 Encounters:  04/03/15 235 lb (106.595 kg)  04/03/15 235 lb (106.595 kg)  04/03/15 237 lb (107.502 kg)    Physical Exam  Constitutional: He is oriented to person, place, and time. He appears well-developed and well-nourished. He is cooperative. No distress.  HENT:  Head: Normocephalic and atraumatic.  Eyes: EOM are normal.  Neck: Normal range of motion. Neck supple. No tracheal deviation present. No thyromegaly present.  Cardiovascular: Normal rate, S1 normal, S2 normal and normal heart sounds.  Exam reveals no gallop.   No murmur heard. Pulses:      Dorsalis pedis pulses are 1+ on the right side, and 1+ on the left side.       Posterior tibial pulses are 1+ on the right side, and 1+ on  the left side.  Pulmonary/Chest: Breath sounds normal. No respiratory distress. He has no wheezes.  Abdominal: Soft. Bowel sounds are normal. He exhibits no distension. There is no tenderness. There is no guarding and no CVA tenderness.  Musculoskeletal: He exhibits no edema.       Right shoulder: He exhibits no swelling and no deformity.  Neurological: He is alert and oriented to person, place, and time. He has normal strength and normal reflexes. No cranial nerve deficit or sensory deficit. Gait normal.  Skin: Skin is warm and dry. No rash noted. No cyanosis. Nails show no clubbing.   Psychiatric: He has a normal mood and affect. His speech is normal and behavior is normal. Judgment and thought content normal. Cognition and memory are normal.    Chemistry (most recent): Lab Results  Component Value Date   NA 136 09/13/2011   K 3.8 09/13/2011   CL 97 09/13/2011   CO2 29 09/13/2011   BUN 8 09/13/2011   CREATININE 0.72 09/13/2011   Diabetic Labs (most recent): Lab Results  Component Value Date   HGBA1C 11 03/14/2015   HGBA1C 11.1 12/18/2014   HGBA1C 12.1* 07/02/2010   Lipid Panel     Component Value Date/Time   CHOL 218* 07/01/2010 0507   TRIG 440* 07/01/2010 0507   HDL 30* 07/01/2010 0507   CHOLHDL 7.3 07/01/2010 0507   VLDL UNABLE TO CALCULATE IF TRIGLYCERIDE OVER 400 mg/dL 16/10/9602 5409   LDLCALC  07/01/2010 0507    UNABLE TO CALCULATE IF TRIGLYCERIDE OVER 400 mg/dL        Total Cholesterol/HDL:CHD Risk Coronary Heart Disease Risk Table                     Men   Women  1/2 Average Risk   3.4   3.3  Average Risk       5.0   4.4  2 X Average Risk   9.6   7.1  3 X Average Risk  23.4   11.0        Use the calculated Patient Ratio above and the CHD Risk Table to determine the patient's CHD Risk.        ATP III CLASSIFICATION (LDL):  <100     mg/dL   Optimal  811-914  mg/dL   Near or Above                    Optimal  130-159  mg/dL   Borderline  782-956  mg/dL   High  >213     mg/dL   Very High     Assessment & Plan:   1. Uncontrolled type 2 diabetes mellitus with hyperosmolarity without coma, without long-term current use of insulin (HCC)  His diabetes is  complicated by severe hypertriglyceridemia and nonadherence to medication. Patient came with unchanged A1c of 11 % , consistent with severely uncontrolled diabetes.  This is mainly because he did not start the insulin prescribed for him last visit.   Recent labs reviewed. - Patient remains at a high risk for more acute and chronic complications of diabetes which include CAD, CVA, CKD,  retinopathy, and neuropathy. These are all discussed in detail with the patient.  - I have re-counseled the patient on diet management and weight loss  by adopting a carbohydrate restricted / protein rich  Diet. - Patient is advised to stick to a routine mealtimes to eat 3 meals  a day and avoid unnecessary snacks (  to snack only to correct hypoglycemia).  - Suggestion is made for patient to avoid simple carbohydrates   from their diet including Cakes , Desserts, Ice Cream,  Soda (  diet and regular) , Sweet Tea , Candies,  Chips, Cookies, Artificial Sweeteners,   and "Sugar-free" Products .  This will help patient to have stable blood glucose profile and potentially avoid unintended  Weight gain.  - The patient  will be  scheduled with Norm Salt, RDN, CDE for individualized DM education. - I have approached patient with the following individualized plan to manage diabetes and patient agrees.  - Patient has hx of alarmingly reluctant and difficult to engage. - I have approached him again for insulin treatment and he reluctantly accepts. -I  urged him to resume  premixed insulin Novolin 70/30  30 units with breakfast and 30 units with supper for pre-meal glucose of greater than 90 mg/DL associated with strict monitoring of glucose  AC and HS. these choice is because he cannot afford the insulin analogs. - Patient is warned not to take insulin without proper monitoring per orders. -Adjustment parameters are given for hypo and hyperglycemia in writing. -Patient is encouraged to call clinic for blood glucose levels less than 70 or above 300 mg /dl. - I discontinued  tanzeum   due to high risk of pancreatitis from severe hypertriglyceridemia at 1660 . - I will continue metformin 1000 mg by mouth twice a day, and Invokana 300 mg by mouth every morning, therapeutically suitable for patient.  - Patient specific target  for A1c; LDL, HDL, Triglycerides, and  Waist Circumference were discussed in  detail.  2) BP/HTN: Better controlled this visit than last one. I have added lisinopril 20 mg by mouth every morning. He is counseled on the need to control hypertension to prevent complications.   3) Lipids/HPL: Recent lipid panel is reviewed with him showing triglycerides extremity elevated at 1660 mg per DL. I urged him to resume atorvastatin 40 mg by mouth daily at bedtime ,   and Niaspan 750 mg extended release once a day. unfortunately Lovaza is not covered by his insurance .  4)  obesity:  Agrees to CDE consult, exercise, and carbohydrates information provided.  5) hypothyroidism: He is advised to continue levothyroxine 75 g by mouth every morning.  - We discussed about correct intake of levothyroxine, at fasting, with water, separated by at least 30 minutes from breakfast, and separated by more than 4 hours from calcium, iron, multivitamins, acid reflux medications (PPIs). -Patient is made aware of the fact that thyroid hormone replacement is needed for life, dose to be adjusted by periodic monitoring of thyroid function tests.  6) Chronic Care/Health Maintenance:  -Patient is on  Statin medications and encouraged to continue to follow up with Ophthalmology, Podiatrist at least yearly or according to recommendations, and advised to stay away from smoking. I have recommended yearly flu vaccine and pneumonia vaccination at least every 5 years; moderate intensity exercise for up to 150 minutes weekly; and  sleep for at least 7 hours a day. 6) hypothyroidism: He would benefit from a slight increase in his levothyroxine dose. I would increase levothyroxine to 75 g by mouth every morning. Appropriate intake of levothyroxine discussed with the patient.  - 30 minutes of time was spent on the care of this patient , 50% of which was applied for counseling on diabetes complications and their preventions.  I advised patient to maintain close follow up with their PCP for  primary care needs.  Patient  is asked to bring meter and  blood glucose logs during their next visit.   Follow up plan: Return in about 10 weeks (around 06/12/2015) for diabetes, high blood pressure, high cholesterol, underactive thyroid, follow up with pre-visit labs, meter, and logs.  Marquis LunchGebre Major Santerre, MD Phone: [phone]  Fax: [phone]   04/03/2015, 8:43 PM

## 2015-04-03 NOTE — Progress Notes (Signed)
  Medical Nutrition Therapy:  Appt start time: 1100 end time:  1130.  Assessment:  Primary concerns today:  Diabetes Type 2. Wife is here. He is on  Invokana, Metformin 1000 mg BD. Novolin 70/30 increased 30 units tiwce a day. Works 3rd shift.  Most recent A1C was 11.1%. He has been eating Hungran Man TV dinner and then other meals avoiding carbs and it's effecting his blood sugars. Not eating as much low carb vegetables as he needs. Drinking only water now. Walking 2 miles a day now and going to start going to the gym. Motivated to improve his blood sugars. His wife cooks and fixes most foods baked and broiled They don't eat out a lot.  Diet is excessive in carbs at times, high  In sodium, fat and low in fresh fruits and low carb vegetables.   Lab Results  Component Value Date   HGBA1C 11 03/14/2015    Preferred Learning Style:   No preference indicated   Learning Readiness:   Ready  Change in progress  MEDICATIONS: see list   DIETARY INTAKE:   24-hr recall:  B ( AM): Sausage, egg and cheese biscuit, water Snk ( AM):  L ( PM): Lasagna,4", water Snk ( PM):  D ( PM): Hungry man meal-chicken breast, carrots, mashed potao, brownie, corn, water, coffee Snk ( PM):  Beverages: water  Usual physical activity:  Walk 2 miles   Estimated energy needs: 1800 calories 200 g carbohydrates 135 g protein 50 g fat  Progress Towards Goal(s):  In progress.   Nutritional Diagnosis:  NB-1.1 Food and nutrition-related knowledge deficit As related to Diabetes.  As evidenced by A1C 11.1%.    Intervention:  Nutrition and Diabetes education provided on My Plate, CHO counting, meal planning, portion sizes, timing of meals, avoiding snacks between meals unless having a low blood sugar, target ranges for A1C and blood sugars, signs/symptoms and treatment of hyper/hypoglycemia, monitoring blood sugars, taking medications as prescribed, benefits of exercising 30 minutes per day and prevention of  complications of DM.  Goals 1. Follow My Plate Method 2. Eat three balanced meals per day. 3, Increase low carb vegetables. 4. Take insulin as directed. 5. Exercise 30-60 minutes daily. 6. Get A1C down to 8% in three months. 7. Test blood sugars twice a day. 8. Do not skip meals and cut out snacks between meals 9. Eat 3 carb choices per meals.    Teaching Method Utilized: None Visual Auditory Hands on  Handouts given during visit include:  The Plate Method   Meal Plan Card  Diabetes Instructions  Barriers to learning/adherence to lifestyle change: none  Demonstrated degree of understanding via:  Teach Back   Monitoring/Evaluation:  Dietary intake, exercise, meal planning, SBG and body weight in 3 month(s).

## 2015-06-12 ENCOUNTER — Ambulatory Visit: Payer: 59 | Admitting: Nutrition

## 2015-06-12 ENCOUNTER — Ambulatory Visit: Payer: 59 | Admitting: "Endocrinology

## 2015-07-03 ENCOUNTER — Encounter: Payer: 59 | Attending: "Endocrinology | Admitting: Nutrition

## 2015-07-03 ENCOUNTER — Other Ambulatory Visit: Payer: Self-pay | Admitting: "Endocrinology

## 2015-07-03 ENCOUNTER — Telehealth: Payer: Self-pay | Admitting: Nutrition

## 2015-07-03 DIAGNOSIS — E1100 Type 2 diabetes mellitus with hyperosmolarity without nonketotic hyperglycemic-hyperosmolar coma (NKHHC): Secondary | ICD-10-CM | POA: Insufficient documentation

## 2015-07-03 NOTE — Telephone Encounter (Signed)
TC to pt to rescheudule missed appt. He notes he has no transporation and is unemployed. Waiting on his employment status to be approved and medicaid. He notes he can't afford his medications. [facility] To get medication assistance. Encouraged him to take his insulin as prescibed since he still has some and then  When he getst he invokana and Metformin to start taking it. He notes his family is going to pharmacy today to see if they can get it. Encouraged pt to contact me or Dr. Isidoro DonningNida's office to reschedule appts after he has gotten his lab work done.

## 2015-07-03 NOTE — Progress Notes (Signed)
  No show for appt. Opened in error.

## 2015-08-14 ENCOUNTER — Encounter (HOSPITAL_COMMUNITY): Payer: Self-pay | Admitting: *Deleted

## 2015-08-14 ENCOUNTER — Emergency Department (HOSPITAL_COMMUNITY)
Admission: EM | Admit: 2015-08-14 | Discharge: 2015-08-14 | Disposition: A | Discharge status: Home / Self Care | Payer: Medicaid Other | Attending: Emergency Medicine | Admitting: Emergency Medicine

## 2015-08-14 ENCOUNTER — Emergency Department (HOSPITAL_COMMUNITY): Payer: Medicaid Other

## 2015-08-14 DIAGNOSIS — I10 Essential (primary) hypertension: Secondary | ICD-10-CM | POA: Insufficient documentation

## 2015-08-14 DIAGNOSIS — E119 Type 2 diabetes mellitus without complications: Secondary | ICD-10-CM | POA: Diagnosis not present

## 2015-08-14 DIAGNOSIS — Z794 Long term (current) use of insulin: Secondary | ICD-10-CM | POA: Diagnosis not present

## 2015-08-14 DIAGNOSIS — Z79899 Other long term (current) drug therapy: Secondary | ICD-10-CM | POA: Diagnosis not present

## 2015-08-14 DIAGNOSIS — Z87891 Personal history of nicotine dependence: Secondary | ICD-10-CM | POA: Insufficient documentation

## 2015-08-14 DIAGNOSIS — R0602 Shortness of breath: Secondary | ICD-10-CM | POA: Insufficient documentation

## 2015-08-14 DIAGNOSIS — Z7982 Long term (current) use of aspirin: Secondary | ICD-10-CM | POA: Insufficient documentation

## 2015-08-14 DIAGNOSIS — R079 Chest pain, unspecified: Secondary | ICD-10-CM

## 2015-08-14 DIAGNOSIS — E785 Hyperlipidemia, unspecified: Secondary | ICD-10-CM | POA: Diagnosis not present

## 2015-08-14 DIAGNOSIS — R42 Dizziness and giddiness: Secondary | ICD-10-CM | POA: Diagnosis not present

## 2015-08-14 DIAGNOSIS — Z7984 Long term (current) use of oral hypoglycemic drugs: Secondary | ICD-10-CM | POA: Insufficient documentation

## 2015-08-14 HISTORY — DX: Disorder of thyroid, unspecified: E07.9

## 2015-08-14 LAB — TSH: TSH: 3.478 u[IU]/mL (ref 0.350–4.500)

## 2015-08-14 LAB — CBC WITH DIFFERENTIAL/PLATELET
BASOS PCT: 0 %
Basophils Absolute: 0 10*3/uL (ref 0.0–0.1)
EOS PCT: 6 %
Eosinophils Absolute: 0.3 10*3/uL (ref 0.0–0.7)
HEMATOCRIT: 45.2 % (ref 39.0–52.0)
Hemoglobin: 16 g/dL (ref 13.0–17.0)
LYMPHS PCT: 43 %
Lymphs Abs: 2.4 10*3/uL (ref 0.7–4.0)
MCH: 30.5 pg (ref 26.0–34.0)
MCHC: 35.4 g/dL (ref 30.0–36.0)
MCV: 86.1 fL (ref 78.0–100.0)
MONO ABS: 0.5 10*3/uL (ref 0.1–1.0)
Monocytes Relative: 9 %
NEUTROS ABS: 2.3 10*3/uL (ref 1.7–7.7)
Neutrophils Relative %: 42 %
PLATELETS: 203 10*3/uL (ref 150–400)
RBC: 5.25 MIL/uL (ref 4.22–5.81)
RDW: 12.2 % (ref 11.5–15.5)
WBC: 5.5 10*3/uL (ref 4.0–10.5)

## 2015-08-14 LAB — HEPATIC FUNCTION PANEL
ALBUMIN: 4.1 g/dL (ref 3.5–5.0)
ALT: 46 U/L (ref 17–63)
AST: 24 U/L (ref 15–41)
Alkaline Phosphatase: 84 U/L (ref 38–126)
BILIRUBIN DIRECT: 0.1 mg/dL (ref 0.1–0.5)
BILIRUBIN INDIRECT: 0.6 mg/dL (ref 0.3–0.9)
TOTAL PROTEIN: 7.2 g/dL (ref 6.5–8.1)
Total Bilirubin: 0.7 mg/dL (ref 0.3–1.2)

## 2015-08-14 LAB — BASIC METABOLIC PANEL
ANION GAP: 6 (ref 5–15)
BUN: 13 mg/dL (ref 6–20)
CHLORIDE: 104 mmol/L (ref 101–111)
CO2: 24 mmol/L (ref 22–32)
Calcium: 9 mg/dL (ref 8.9–10.3)
Creatinine, Ser: 0.61 mg/dL (ref 0.61–1.24)
GFR calc non Af Amer: >60 mL/min (ref >60)
Glucose, Bld: 228 mg/dL — ABNORMAL HIGH (ref 65–99)
POTASSIUM: 4 mmol/L (ref 3.5–5.1)
Sodium: 134 mmol/L — ABNORMAL LOW (ref 135–145)

## 2015-08-14 LAB — T4, FREE: FREE T4: 0.78 ng/dL (ref 0.61–1.12)

## 2015-08-14 LAB — TROPONIN I

## 2015-08-14 LAB — D-DIMER, QUANTITATIVE: D-Dimer, Quant: 0.28 ug/mL-FEU (ref 0.00–0.50)

## 2015-08-14 NOTE — ED Notes (Signed)
Pt states he has had chest pain on left chest for 1 week. Denies any shortness of breath.

## 2015-08-14 NOTE — ED Provider Notes (Addendum)
CSN: [id number]     Arrival date & time 08/14/15  9562 History  By signing my name below, I, Placido Sou, attest that this documentation has been prepared under the direction and in the presence of Bethann Berkshire, MD. Electronically Signed: Placido Sou, ED Scribe. 08/14/2015. 9:02 AM.   Chief Complaint  Patient presents with  . Chest Pain   Patient is a 40 y.o. male presenting with chest pain. The history is provided by the patient. No language interpreter was used.  Chest Pain Pain location:  L chest Pain severity:  Moderate Onset quality:  Gradual Duration:  1 week Timing:  Intermittent Progression:  Worsening Chronicity:  Recurrent Associated symptoms: diaphoresis, dizziness and shortness of breath   Risk factors: diabetes mellitus, high cholesterol, hypertension and obesity     HPI Comments: Nathan [name] is a 40 y.o. male with a PMHx of DM, HTN, HLD, cardiac arrest and obesity who presents to the Emergency Department complaining of intermittent, worsening, left sided CP x 1 week. He reports associated SOB, cold sweats, dizziness and 1x near syncopal episode last night while at the store. His wife states that he will intermittently experience pallor with decreased HR and BP stating his BP has lowered to 100/62 mmHg with a HR of 63. Pt states his symptoms worsen with exertion noting this has been an ongoing issue for 2 years. After his symptoms worsen he states it will take him 3-4 days of rest to begin feeling nml once again. Pt states he was taking beta blockers a few years ago and discontinued due to low HR. Pt states that his last catheterization was ~3-4 years ago with no abnormalities. He denies fever, cough and sore throat.    Past Medical History  Diagnosis Date  . Pneumonia   . Diabetes mellitus   . Hypertension   . Cardiac arrest (HCC)   . HLD (hyperlipidemia)   . TMJ (dislocation of temporomandibular joint)   . Syncope   . Obesity   . Chest pain   . Thyroid  disease    Past Surgical History  Procedure Laterality Date  . Cardiac catheterization     Family History  Problem Relation Age of Onset  . Heart failure Mother   . Heart attack Father     x5    Social History  Substance Use Topics  . Smoking status: Former Smoker -- 6 years    Types: Cigarettes    Quit date: 01/25/2008  . Smokeless tobacco: None  . Alcohol Use: 2.4 oz/week    4 Cans of beer per week     Comment: 3-4 beers weekly    Review of Systems  Constitutional: Positive for chills and diaphoresis. Negative for appetite change.  HENT: Negative for congestion, ear discharge and sinus pressure.   Eyes: Negative for discharge.  Respiratory: Positive for shortness of breath.   Cardiovascular: Positive for chest pain.  Gastrointestinal: Negative for diarrhea.  Genitourinary: Negative for frequency and hematuria.  Skin: Positive for pallor. Negative for rash.  Neurological: Positive for dizziness. Negative for seizures and syncope.  Psychiatric/Behavioral: Negative for hallucinations.  All other systems reviewed and are negative.   Allergies  Benzocaine; Orange juice; and Grapefruit concentrate  Home Medications   Prior to Admission medications   Medication Sig Start Date End Date Taking? Authorizing Provider  aspirin 81 MG tablet Take 81 mg by mouth daily.    Historical Provider, MD  atorvastatin (LIPITOR) 40 MG tablet Take 1 tablet (40 mg total)  by mouth daily. 11/03/14   Roma Kayser, MD  canagliflozin (INVOKANA) 300 MG TABS tablet TAKE ONE TABLET BY MOUTH IN THE MORNING BEFORE  BREAKFAST 03/12/15   Roma Kayser, MD  insulin NPH-regular Human (NOVOLIN 70/30) (70-30) 100 UNIT/ML injection Inject 30 Units into the skin 2 (two) times daily with a meal. 04/03/15   Roma Kayser, MD  INSULIN SYRINGE .5CC/29G 29G X 1/2" 0.5 ML MISC Use to inject insulin 2 x a day 03/20/15   Roma Kayser, MD  levothyroxine (SYNTHROID, LEVOTHROID) 75 MCG tablet  Take 1 tablet (75 mcg total) by mouth daily before breakfast. 03/12/15   Roma Kayser, MD  metFORMIN (GLUCOPHAGE) 1000 MG tablet TAKE ONE TABLET BY MOUTH TWICE DAILY WITH A MEAL 07/03/15   Roma Kayser, MD   BP 179/107 mmHg  Pulse 75  Temp(Src) 98.3 F (36.8 C) (Oral)  Resp 16  Ht  (1.803 m)  Wt 232 lb (105.235 kg)  BMI 32.37 kg/m2  SpO2 100%    Physical Exam  Constitutional: He is oriented to person, place, and time. He appears well-developed.  HENT:  Head: Normocephalic.  Eyes: Conjunctivae and EOM are normal. No scleral icterus.  Neck: Neck supple. No thyromegaly present.  Cardiovascular: Normal rate and regular rhythm.  Exam reveals no gallop and no friction rub.   No murmur heard. Pulmonary/Chest: No stridor. He has no wheezes. He has no rales. He exhibits no tenderness.  Abdominal: He exhibits no distension. There is no tenderness. There is no rebound.  Musculoskeletal: Normal range of motion. He exhibits no edema.  Lymphadenopathy:    He has no cervical adenopathy.  Neurological: He is oriented to person, place, and time. He exhibits normal muscle tone. Coordination normal.  Skin: No rash noted. No erythema.  Psychiatric: He has a normal mood and affect. His behavior is normal.    ED Course  Procedures  DIAGNOSTIC STUDIES: Oxygen Saturation is 100% on RA, normal by my interpretation.    COORDINATION OF CARE: 8:57 AM Discussed next steps with pt. Pt verbalized understanding and is agreeable with the plan.   Labs Review Labs Reviewed  BASIC METABOLIC PANEL - Abnormal; Notable for the following:    Sodium 134 (*)    Glucose, Bld 228 (*)    All other components within normal limits  TROPONIN I  HEPATIC FUNCTION PANEL  CBC WITH DIFFERENTIAL/PLATELET  D-DIMER, QUANTITATIVE (NOT AT [facility])    Imaging Review Dg Chest 2 View  08/14/2015  CLINICAL DATA:  Chest pain, syncope EXAM: CHEST  2 VIEW COMPARISON:  09/13/2011 FINDINGS: The heart size and  mediastinal contours are within normal limits. Both lungs are clear. The visualized skeletal structures are unremarkable. IMPRESSION: No active cardiopulmonary disease. Electronically Signed   By: Elige Ko   On: 08/14/2015 09:28   I have personally reviewed and evaluated these images and lab results as part of my medical decision-making.   EKG Interpretation   Date/Time:  Friday August 14 2015 08:39:04 EDT Ventricular Rate:  79 PR Interval:    QRS Duration: 78 QT Interval:  354 QTC Calculation: 406 R Axis:   30 Text Interpretation:  Sinus rhythm Confirmed by Calynn Ferrero  MD, Clifton Safley ([id number])  on 08/14/2015 8:50:31 AM      MDM   Final diagnoses:  None    Patient with chest pain and sweating today. All labs unremarkable. Except for his elevated sugar. Patient has been worked up thoroughly for similar symptoms before  with a normal cardiac cath. Patient symptoms improved at discharge. Patient will be following up with his family doctor next week for this noncardiac chest pain   Bethann BerkshireJoseph Eliazar Olivar, MD 08/14/15 1307   The chart was scribed for me under my direct supervision.  I personally performed the history, physical, and medical decision making and all procedures in the evaluation of this patient.Bethann Berkshire.   Valli Randol, MD 08/14/15 [id number]

## 2015-08-14 NOTE — Discharge Instructions (Signed)
Rest over the weekend drink plenty of fluids and follow-up with their family doctor next week to see what the thyroid study show

## 2015-08-23 ENCOUNTER — Emergency Department (HOSPITAL_COMMUNITY)
Admission: EM | Admit: 2015-08-23 | Discharge: 2015-08-23 | Disposition: A | Discharge status: Home / Self Care | Payer: Medicaid Other | Attending: Emergency Medicine | Admitting: Emergency Medicine

## 2015-08-23 ENCOUNTER — Emergency Department (HOSPITAL_COMMUNITY): Payer: Medicaid Other

## 2015-08-23 ENCOUNTER — Encounter (HOSPITAL_COMMUNITY): Payer: Self-pay

## 2015-08-23 DIAGNOSIS — E039 Hypothyroidism, unspecified: Secondary | ICD-10-CM | POA: Diagnosis not present

## 2015-08-23 DIAGNOSIS — I159 Secondary hypertension, unspecified: Secondary | ICD-10-CM

## 2015-08-23 DIAGNOSIS — Z87891 Personal history of nicotine dependence: Secondary | ICD-10-CM | POA: Diagnosis not present

## 2015-08-23 DIAGNOSIS — S80261A Insect bite (nonvenomous), right knee, initial encounter: Secondary | ICD-10-CM | POA: Insufficient documentation

## 2015-08-23 DIAGNOSIS — Z7984 Long term (current) use of oral hypoglycemic drugs: Secondary | ICD-10-CM | POA: Diagnosis not present

## 2015-08-23 DIAGNOSIS — E119 Type 2 diabetes mellitus without complications: Secondary | ICD-10-CM | POA: Diagnosis not present

## 2015-08-23 DIAGNOSIS — R0789 Other chest pain: Secondary | ICD-10-CM

## 2015-08-23 DIAGNOSIS — Y929 Unspecified place or not applicable: Secondary | ICD-10-CM | POA: Insufficient documentation

## 2015-08-23 DIAGNOSIS — Y999 Unspecified external cause status: Secondary | ICD-10-CM | POA: Insufficient documentation

## 2015-08-23 DIAGNOSIS — Z79899 Other long term (current) drug therapy: Secondary | ICD-10-CM | POA: Insufficient documentation

## 2015-08-23 DIAGNOSIS — Y939 Activity, unspecified: Secondary | ICD-10-CM | POA: Insufficient documentation

## 2015-08-23 DIAGNOSIS — Z794 Long term (current) use of insulin: Secondary | ICD-10-CM | POA: Diagnosis not present

## 2015-08-23 DIAGNOSIS — R42 Dizziness and giddiness: Secondary | ICD-10-CM

## 2015-08-23 DIAGNOSIS — W57XXXA Bitten or stung by nonvenomous insect and other nonvenomous arthropods, initial encounter: Secondary | ICD-10-CM

## 2015-08-23 DIAGNOSIS — S40862A Insect bite (nonvenomous) of left upper arm, initial encounter: Secondary | ICD-10-CM | POA: Diagnosis not present

## 2015-08-23 DIAGNOSIS — Z7982 Long term (current) use of aspirin: Secondary | ICD-10-CM | POA: Insufficient documentation

## 2015-08-23 LAB — COMPREHENSIVE METABOLIC PANEL
ALBUMIN: 4.1 g/dL (ref 3.5–5.0)
ALT: 48 U/L (ref 17–63)
AST: 32 U/L (ref 15–41)
Alkaline Phosphatase: 89 U/L (ref 38–126)
Anion gap: 10 (ref 5–15)
BUN: 16 mg/dL (ref 6–20)
CHLORIDE: 102 mmol/L (ref 101–111)
CO2: 22 mmol/L (ref 22–32)
Calcium: 9.6 mg/dL (ref 8.9–10.3)
Creatinine, Ser: 0.54 mg/dL — ABNORMAL LOW (ref 0.61–1.24)
GFR calc Af Amer: >60 mL/min (ref >60)
GFR calc non Af Amer: >60 mL/min (ref >60)
GLUCOSE: 196 mg/dL — AB (ref 65–99)
POTASSIUM: 3.5 mmol/L (ref 3.5–5.1)
Sodium: 134 mmol/L — ABNORMAL LOW (ref 135–145)
Total Bilirubin: 0.5 mg/dL (ref 0.3–1.2)
Total Protein: 6.7 g/dL (ref 6.5–8.1)

## 2015-08-23 LAB — CBC
HCT: 43.3 % (ref 39.0–52.0)
Hemoglobin: 15.7 g/dL (ref 13.0–17.0)
MCH: 30.8 pg (ref 26.0–34.0)
MCHC: 36.3 g/dL — ABNORMAL HIGH (ref 30.0–36.0)
MCV: 85.1 fL (ref 78.0–100.0)
PLATELETS: 258 10*3/uL (ref 150–400)
RBC: 5.09 MIL/uL (ref 4.22–5.81)
RDW: 12.1 % (ref 11.5–15.5)
WBC: 6.3 10*3/uL (ref 4.0–10.5)

## 2015-08-23 LAB — TROPONIN I: Troponin I: <0.03 ng/mL (ref <0.03)

## 2015-08-23 MED ORDER — DOXYCYCLINE HYCLATE 100 MG PO CAPS: 100.0000 mg | ORAL_CAPSULE | Freq: Two times a day (BID) | ORAL | 0 refills | Status: DC

## 2015-08-23 NOTE — ED Provider Notes (Addendum)
[facility]-EMERGENCY DEPT Provider Note   CSN: [id number] Arrival date & time: 08/23/15  0046  First Provider Contact:  01:14 AM    History   Chief Complaint Chief Complaint  Patient presents with  . Chest Pain    HPI Nathan [name] is a 40 y.o. male.  HPI patient states tonight about 11 PM he was watching TV and he got up to get his wife some water. He states he walked about 15 feet and then he had a feeling that was a combination of almost spinning with weakness. He states that lasted about 10 seconds. Then his chest got tight in his neck got tight and he felt a fluttering in his chest that was painful. That lasted about 5 seconds. He then had ice in his veins and felt like he had an ice chest in his chest. He got sweaty and felt weak. He states his whole body was totally numb. He states he felt short of breath. This lasted about an hour. He is improving now. Now he states he just feels hot and his face does look flushed. He states his blood pressure initially done by EMS was 198/126.  Patient reports he had a cardiac cath about 3 years ago that was normal. He also states four and a half years ago he had a cardiac arrest after being started on a blood pressure medication. He states in September he had been hunting and drug a deer about 120 yards and felt fine however afterwards he got severe chest pain. He was seen by a cardiologist at that time.  Patient states he had run out of his diabetes and thyroid medication for about 2 months but he's been back on it now for about a week. He also states his A1c was 11.5 and last week improved to 9.2. He complains of a lot of diabetic nerve pain.  Patient also states in the last 2 months he's had 3 tick bites, one was on his left posterior shoulder, on his left calf, and on his right knee. He states all of them had a large red area around him.  EMS had his wife give him 4 baby aspirin to chew prior to coming to the ED.  Patient states he does martial  arts and he can do that multiple times without difficulty. However he started lifting 15 pound weights couple weeks ago and he notices after a couple days of doing that he feels bad.   PCP Dr Sherwood Gambler  Past Medical History:  Diagnosis Date  . Cardiac arrest (HCC)   . Chest pain   . Diabetes mellitus   . HLD (hyperlipidemia)   . Hypertension   . Obesity   . Pneumonia   . Syncope   . Thyroid disease   . TMJ (dislocation of temporomandibular joint)     Patient Active Problem List   Diagnosis Date Noted  . Type 2 diabetes mellitus with vascular disease (HCC) 12/12/2014  . Primary hypothyroidism 11/03/2014  . Hyperlipidemia 11/03/2014  . Chest pain   . Hypertension   . HLD (hyperlipidemia)     Past Surgical History:  Procedure Laterality Date  . CARDIAC CATHETERIZATION         Home Medications    Prior to Admission medications   Medication Sig Start Date End Date Taking? Authorizing Provider  aspirin 81 MG tablet Take 81 mg by mouth daily.    Historical Provider, MD  atorvastatin (LIPITOR) 40 MG tablet Take 1 tablet (40 mg total)  by mouth daily. 11/03/14   Roma Kayser, MD  canagliflozin (INVOKANA) 300 MG TABS tablet TAKE ONE TABLET BY MOUTH IN THE MORNING BEFORE  BREAKFAST 03/12/15   Roma Kayser, MD  doxycycline (VIBRAMYCIN) 100 MG capsule Take 1 capsule (100 mg total) by mouth 2 (two) times daily. 08/23/15   Devoria Albe, MD  insulin NPH-regular Human (NOVOLIN 70/30) (70-30) 100 UNIT/ML injection Inject 30 Units into the skin 2 (two) times daily with a meal. 04/03/15   Roma Kayser, MD  INSULIN SYRINGE .5CC/29G 29G X 1/2" 0.5 ML MISC Use to inject insulin 2 x a day 03/20/15   Roma Kayser, MD  levothyroxine (SYNTHROID, LEVOTHROID) 75 MCG tablet Take 1 tablet (75 mcg total) by mouth daily before breakfast. 03/12/15   Roma Kayser, MD  metFORMIN (GLUCOPHAGE) 1000 MG tablet TAKE ONE TABLET BY MOUTH TWICE DAILY WITH A MEAL 07/03/15   Roma Kayser, MD    Family History Family History  Problem Relation Age of Onset  . Heart failure Mother   . Heart attack Father     x5     Social History Social History  Substance Use Topics  . Smoking status: Former Smoker    Years: 6.00    Types: Cigarettes    Quit date: 01/25/2008  . Smokeless tobacco: Never Used  . Alcohol use 2.4 oz/week    4 Cans of beer per week     Comment: 3-4 beers weekly  lives at home Lives with spouse Used to drive trucks   Allergies   Benzocaine; Orange juice [orange oil]; and Grapefruit concentrate   Review of Systems Review of Systems  All other systems reviewed and are negative.    Physical Exam Updated Vital Signs BP 162/98 (BP Location: Right Arm)   Pulse 89   Temp 98.5 F (36.9 C) (Oral)   Resp 20   Ht 5\' 11"  (1.803 m)   Wt 233 lb (105.7 kg)   SpO2 99%   BMI 32.50 kg/m   Vital signs normal except hypertension   Physical Exam  Constitutional: He is oriented to person, place, and time. He appears well-developed and well-nourished.  Non-toxic appearance. He does not appear ill. No distress.  HENT:  Head: Normocephalic and atraumatic.  Right Ear: External ear normal.  Left Ear: External ear normal.  Nose: Nose normal. No mucosal edema or rhinorrhea.  Mouth/Throat: Oropharynx is clear and moist and mucous membranes are normal. No dental abscesses or uvula swelling.  Eyes: Conjunctivae and EOM are normal. Pupils are equal, round, and reactive to light.  Neck: Normal range of motion and full passive range of motion without pain. Neck supple.  Cardiovascular: Normal rate, regular rhythm and normal heart sounds.  Exam reveals no gallop and no friction rub.   No murmur heard. Pulmonary/Chest: Effort normal and breath sounds normal. No respiratory distress. He has no wheezes. He has no rhonchi. He has no rales. He exhibits no tenderness and no crepitus.  Abdominal: Soft. Normal appearance and bowel sounds are normal. He exhibits no  distension. There is no tenderness. There is no rebound and no guarding.  Musculoskeletal: Normal range of motion. He exhibits no edema or tenderness.  Moves all extremities well.   Neurological: He is alert and oriented to person, place, and time. He has normal strength. No cranial nerve deficit.  Skin: Skin is warm, dry and intact. No rash noted. No erythema. No pallor.  Face appears flushed  Psychiatric: He  has a normal mood and affect. His speech is normal and behavior is normal. His mood appears not anxious.  Nursing note and vitals reviewed.    ED Treatments / Results  Labs (all labs ordered are listed, but only abnormal results are displayed) Results for orders placed or performed during the hospital encounter of 08/23/15  CBC  Result Value Ref Range   WBC 6.3 4.0 - 10.5 K/uL   RBC 5.09 4.22 - 5.81 MIL/uL   Hemoglobin 15.7 13.0 - 17.0 g/dL   HCT 16.1 09.6 - 04.5 %   MCV 85.1 78.0 - 100.0 fL   MCH 30.8 26.0 - 34.0 pg   MCHC 36.3 (H) 30.0 - 36.0 g/dL   RDW 40.9 81.1 - 91.4 %   Platelets 258 150 - 400 K/uL  Troponin I  Result Value Ref Range   Troponin I <0.03 <0.03 ng/mL  Comprehensive metabolic panel  Result Value Ref Range   Sodium 134 (L) 135 - 145 mmol/L   Potassium 3.5 3.5 - 5.1 mmol/L   Chloride 102 101 - 111 mmol/L   CO2 22 22 - 32 mmol/L   Glucose, Bld 196 (H) 65 - 99 mg/dL   BUN 16 6 - 20 mg/dL   Creatinine, Ser 7.82 (L) 0.61 - 1.24 mg/dL   Calcium 9.6 8.9 - 95.6 mg/dL   Total Protein 6.7 6.5 - 8.1 g/dL   Albumin 4.1 3.5 - 5.0 g/dL   AST 32 15 - 41 U/L   ALT 48 17 - 63 U/L   Alkaline Phosphatase 89 38 - 126 U/L   Total Bilirubin 0.5 0.3 - 1.2 mg/dL   GFR calc non Af Amer >60 >60 mL/min   GFR calc Af Amer >60 >60 mL/min   Anion gap 10 5 - 15  Troponin I  Result Value Ref Range   Troponin I <0.03 <0.03 ng/mL   Laboratory interpretation all normal except hyperglycemia    EKG  EKG Interpretation  Date/Time:  Sunday August 23 2015 00:57:46  EDT Ventricular Rate:  91 PR Interval:    QRS Duration: 100 QT Interval:  354 QTC Calculation: 436 R Axis:   16 Text Interpretation:  Sinus rhythm Baseline wander No significant change since last tracing 14 Aug 2015 Confirmed by Avigail Pilling  MD-I, Lelia Jons ([id number]) on 08/23/2015 1:12:10 AM       Radiology Dg Chest 2 View  Result Date: 08/23/2015 CLINICAL DATA:  40 year old male with chest pain EXAM: CHEST  2 VIEW COMPARISON:  Chest radiograph dated 08/14/2015 FINDINGS: The heart size and mediastinal contours are within normal limits. Both lungs are clear. The visualized skeletal structures are unremarkable. IMPRESSION: No active cardiopulmonary disease. Electronically Signed   By: Elgie Collard M.D.   On: 08/23/2015 01:23   Procedures Procedures (including critical care time)  Medications Ordered in ED Medications - No data to display   Initial Impression / Assessment and Plan / ED Course  I have reviewed the triage vital signs and the nursing notes.  Pertinent labs & imaging results that were available during my care of the patient were reviewed by me and considered in my medical decision making (see chart for details).  Clinical Course   Labs were ordered. Patient was placed on cardiac monitor.  Recheck at 3:30 AM patient's blood pressure has improved without treatment 132/96. Patient is less flushed in his face. He states he's feeling better and feels like his energy is coming back. We talked about getting the deltoid troponin, hopefully  if that's normal he will be discharged home.  Recheck at 4:50 AM patient's deltoid troponin is negative for myocardial event tonight. Patient states he's feeling better. We discussed seeing the cardiologist again to see if there is any further testing that he would recommend. When I review his chart he has seen Dr. Wyline Mood. His blood pressure remains in the 130s.  Final Clinical Impressions(s) / ED Diagnoses   Final diagnoses:  Chest pain, atypical   Secondary hypertension, unspecified  Dizziness  Tick bites   Discharge Medication List as of 08/23/2015  5:08 AM    doxycycline 100 mg BID x 10 days   Plan discharge  Devoria Albe, MD, Concha Pyo, MD 08/23/15 1941    Devoria Albe, MD 08/23/15 [phone]

## 2015-08-23 NOTE — Discharge Instructions (Signed)
Call Dr. Verna Czech office to be evaluated again. You saw his physician assistant in January. Return to the emergency department if you feel worse again.

## 2015-08-23 NOTE — ED Notes (Signed)
Patient verbalizes understanding of discharge instructions, home care and follow up care. Patient out of department at this time with family. 

## 2015-08-23 NOTE — ED Triage Notes (Signed)
Chest pain and HTN tonight at 2300. Central chest tightness as described by patient. Nausea, dizziness, weakness associated with chest tightness.

## 2015-08-24 NOTE — ED Notes (Signed)
Called in prescription for Doxycycline as written to [facility] in [place].  Called to notify pt but no answer.  Left voicemail for pt to call back.

## 2015-09-04 ENCOUNTER — Encounter: Payer: Self-pay | Admitting: *Deleted

## 2015-09-04 ENCOUNTER — Encounter: Payer: Self-pay | Admitting: Cardiology

## 2015-09-04 ENCOUNTER — Ambulatory Visit (INDEPENDENT_AMBULATORY_CARE_PROVIDER_SITE_OTHER): Payer: Medicaid Other | Admitting: Cardiology

## 2015-09-04 VITALS — BP 118/84 | HR 90 | Ht 71.0 in | Wt 228.0 lb

## 2015-09-04 DIAGNOSIS — Z0389 Encounter for observation for other suspected diseases and conditions ruled out: Secondary | ICD-10-CM

## 2015-09-04 DIAGNOSIS — IMO0001 Reserved for inherently not codable concepts without codable children: Secondary | ICD-10-CM | POA: Insufficient documentation

## 2015-09-04 DIAGNOSIS — R079 Chest pain, unspecified: Secondary | ICD-10-CM | POA: Diagnosis not present

## 2015-09-04 MED ORDER — AMLODIPINE BESYLATE 2.5 MG PO TABS: 2.5000 mg | ORAL_TABLET | Freq: Every day | ORAL | 0 refills | Status: DC

## 2015-09-04 NOTE — Assessment & Plan Note (Signed)
Cath 2014

## 2015-09-04 NOTE — Assessment & Plan Note (Signed)
On statin Rx 

## 2015-09-04 NOTE — Assessment & Plan Note (Signed)
Followed by PCP

## 2015-09-04 NOTE — Progress Notes (Signed)
09/04/2015 Nathan [name]   1975-09-16  [id number]  Primary Physician Lenise HeraldMANN, [name], PA-C Primary Cardiologist: Dr Purvis SheffieldKoneswaran  HPI:  40 y/o male referred to us by Dr Sherwood Gamblerfusco for chest pain, palpitations, and near syncope. The pt has had chest pain going back 10 yrs. [facility] On further review it sounds like he had a vagal episode with transient bradycardia when they moved him from his bed to a wheel chair. He had diagnostic cath in 2014 that showed normal coronaries and normal LVF. Echo in the past has been normal and event monitor in the past have been unrevealing for any significant arrhythmia.           The pt was sen in the ED for chest pain 08/14/15 and again 08/24/15 for a near syncopal spell (sound orthostatic). Labs, CXR, and EKG all normal. The pt describes mid sternal discomfort "like ice". He says this frequently comes on one to two days after he exercises. He says he was told at the time of his cath that he "spasm" but this was noted noted in the cath note.    Current Outpatient Prescriptions  Medication Sig Dispense Refill  . aspirin 81 MG tablet Take 81 mg by mouth daily.    Marland Kitchen. atorvastatin (LIPITOR) 40 MG tablet Take 1 tablet (40 mg total) by mouth daily. 30 tablet 3  . canagliflozin (INVOKANA) 300 MG TABS tablet TAKE ONE TABLET BY MOUTH IN THE MORNING BEFORE  BREAKFAST 30 tablet 2  . doxycycline (VIBRAMYCIN) 100 MG capsule Take 1 capsule (100 mg total) by mouth 2 (two) times daily. 20 capsule 0  . insulin NPH-regular Human (NOVOLIN 70/30) (70-30) 100 UNIT/ML injection Inject 30 Units into the skin 2 (two) times daily with a meal. 10 mL 2  . INSULIN SYRINGE .5CC/29G 29G X 1/2" 0.5 ML MISC Use to inject insulin 2 x a day 100 each 3  . levothyroxine (SYNTHROID, LEVOTHROID) 75 MCG tablet Take 1 tablet (75 mcg total) by mouth daily before breakfast. 30 tablet 2  . metFORMIN (GLUCOPHAGE) 1000 MG tablet TAKE ONE TABLET BY MOUTH TWICE DAILY WITH A MEAL  60 tablet 2   No current facility-administered medications for this visit.     Allergies  Allergen Reactions  . Benzocaine Anaphylaxis and Other (See Comments)    REACTION: Blistering all over and throat swells shut  . Orange Juice [Orange Oil] Anaphylaxis  . Grapefruit Concentrate     Social History   Social History  . Marital status: Married    Spouse name: N/A  . Number of children: N/A  . Years of education: N/A   Occupational History  . Not on file.   Social History Main Topics  . Smoking status: Former Smoker    Years: 6.00    Types: Cigarettes    Quit date: 01/25/2008  . Smokeless tobacco: Never Used  . Alcohol use 2.4 oz/week    4 Cans of beer per week     Comment: 3-4 beers weekly  . Drug use: No  . Sexual activity: Not on file   Other Topics Concern  . Not on file   Social History Narrative  . No narrative on file     Review of Systems: General: negative for chills, fever, night sweats or weight changes.  Cardiovascular: negative for chest pain, dyspnea on exertion, edema, orthopnea, palpitations, paroxysmal nocturnal dyspnea or shortness of breath Dermatological: negative for rash Respiratory: negative for cough  or wheezing Urologic: negative for hematuria Abdominal: negative for nausea, vomiting, diarrhea, bright red blood per rectum, melena, or hematemesis Neurologic: negative for visual changes, syncope, or dizziness All other systems reviewed and are otherwise negative except as noted above.    Blood pressure 118/84, pulse 90, height  (1.803 m), weight 228 lb (103.4 kg), SpO2 97 %.  General appearance: alert, cooperative, no distress and mildly obese Neck: no carotid bruit and no JVD Lungs: clear to auscultation bilaterally Heart: regular rate and rhythm Extremities: extremities normal, atraumatic, no cyanosis or edema Pulses: 2+ and symmetric Skin: Skin color, texture, turgor normal. No rashes or lesions Neurologic: Grossly  normal  EKG (Fr Dr Fusco's office)-NSR, LVH  ASSESSMENT AND PLAN:   Chest pain R/O coronary vasospasm  Hyperlipidemia On statin Rx  Type 2 diabetes mellitus with vascular disease (HCC) Followed by PCP  Normal coronary arteries Cath 2014   PLAN  The pt tells me he is not taking Cozaar-"causes my B/P to drop". I suggested we try low dose Amlodipine in case he does in fact have coronary spasm. I also suggested we proceed with a stress echo. He can follow up with Dr Purvis Sheffield after this.   Corine Shelter PA-C 09/04/2015 2:33 PM

## 2015-09-04 NOTE — Patient Instructions (Signed)
Your physician recommends that you schedule a follow-up appointment in: 6-8 Weeks with Dr. Purvis SheffieldKoneswaran  Your physician has recommended you make the following change in your medication: Start Taking Norvasc 2.5 mg Take One Tablet Daily.  If you need a refill on your cardiac medications before your next appointment, please call your pharmacy.  Thank you for choosing [facility]!

## 2015-09-04 NOTE — Assessment & Plan Note (Signed)
R/O coronary vasospasm

## 2015-09-17 ENCOUNTER — Ambulatory Visit (HOSPITAL_COMMUNITY)
Admission: RE | Admit: 2015-09-17 | Discharge: 2015-09-17 | Disposition: A | Discharge status: Home / Self Care | Payer: Medicaid Other | Source: Ambulatory Visit | Attending: Cardiology | Admitting: Cardiology

## 2015-09-17 DIAGNOSIS — I509 Heart failure, unspecified: Secondary | ICD-10-CM | POA: Diagnosis not present

## 2015-09-17 DIAGNOSIS — R079 Chest pain, unspecified: Secondary | ICD-10-CM | POA: Diagnosis not present

## 2015-09-17 LAB — ECHOCARDIOGRAM STRESS TEST
Estimated workload: 10.4 METS
Exercise duration (min): 8 min
Exercise duration (sec): 0 s
MPHR: 182 {beats}/min
Peak HR: 179 {beats}/min
Percent HR: 98 %
RPE: 10
Rest HR: 94 {beats}/min

## 2015-09-17 NOTE — Progress Notes (Signed)
*  PRELIMINARY RESULTS* Echocardiogram Echocardiogram Stress Test has been performed.  Stacey DrainWhite, Taimi Towe J 09/17/2015, 12:41 PM

## 2015-09-23 ENCOUNTER — Encounter: Payer: Medicaid Other | Attending: "Endocrinology | Admitting: Nutrition

## 2015-09-23 ENCOUNTER — Ambulatory Visit (INDEPENDENT_AMBULATORY_CARE_PROVIDER_SITE_OTHER): Payer: Medicaid Other | Admitting: "Endocrinology

## 2015-09-23 ENCOUNTER — Encounter: Payer: Self-pay | Admitting: "Endocrinology

## 2015-09-23 VITALS — Ht 71.0 in | Wt 231.0 lb

## 2015-09-23 VITALS — BP 135/87 | HR 88 | Ht 71.0 in | Wt 231.0 lb

## 2015-09-23 DIAGNOSIS — E1100 Type 2 diabetes mellitus with hyperosmolarity without nonketotic hyperglycemic-hyperosmolar coma (NKHHC): Secondary | ICD-10-CM | POA: Insufficient documentation

## 2015-09-23 DIAGNOSIS — E039 Hypothyroidism, unspecified: Secondary | ICD-10-CM | POA: Diagnosis not present

## 2015-09-23 DIAGNOSIS — E669 Obesity, unspecified: Secondary | ICD-10-CM

## 2015-09-23 DIAGNOSIS — E1159 Type 2 diabetes mellitus with other circulatory complications: Secondary | ICD-10-CM | POA: Diagnosis not present

## 2015-09-23 DIAGNOSIS — E118 Type 2 diabetes mellitus with unspecified complications: Secondary | ICD-10-CM

## 2015-09-23 DIAGNOSIS — E1165 Type 2 diabetes mellitus with hyperglycemia: Secondary | ICD-10-CM

## 2015-09-23 DIAGNOSIS — I10 Essential (primary) hypertension: Secondary | ICD-10-CM

## 2015-09-23 DIAGNOSIS — Z794 Long term (current) use of insulin: Secondary | ICD-10-CM

## 2015-09-23 DIAGNOSIS — E785 Hyperlipidemia, unspecified: Secondary | ICD-10-CM

## 2015-09-23 DIAGNOSIS — IMO0002 Reserved for concepts with insufficient information to code with codable children: Secondary | ICD-10-CM

## 2015-09-23 MED ORDER — INSULIN NPH ISOPHANE & REGULAR (70-30) 100 UNIT/ML ~~LOC~~ SUSP: 40.0000 [IU] | Freq: Two times a day (BID) | SUBCUTANEOUS | 2 refills | Status: DC

## 2015-09-23 NOTE — Progress Notes (Signed)
  Medical Nutrition Therapy:  Appt start time: 1100 end time:  1130.  Assessment:  Primary concerns today:  Diabetes Type 2.  Saw Dr. Fransico HimNida today. He is here with his wife. Taking 70/30insulin 40 units BID now instead of 30 as of today. Metformin 1000 mg BID, Invokana. BS log brought in . BS better down from 11% to  9.7%. Seeing [facility] for PCP.  Has cut out eating out a lot.  Working out with [employer] at home.  His days and nights are flopped so he stays up all night and sleeps most of day. Working on getting meal time coordinated. Needs to avoid snacking between meals at night. Wants to lose further weight.  Drinking a lot of water. Tested blood sugars 5-6 times per day. Diet improving but still needs more vegetables, fresh fruits and whole grains.   Lab Results  Component Value Date   HGBA1C 11 03/14/2015    Preferred Learning Style:   No preference indicated   Learning Readiness:   Ready  Change in progress  MEDICATIONS: see list   DIETARY INTAKE:   24-hr recall:  B ( AM): Cheese danish, Snk ( AM):  L ( PM):Salmon, mashed potatoes., green beans, water Snk ( PM):  D ( PM):  Fish or chicken, mashed ptoatoes, or corn,  Snk ( PM):  5-6 boiled eggs, Beverages: water  Usual physical activity:  Walk 2 miles   Estimated energy needs: 1800 calories 200 g carbohydrates 135 g protein 50 g fat  Progress Towards Goal(s):  In progress.   Nutritional Diagnosis:  NB-1.1 Food and nutrition-related knowledge deficit As related to Diabetes.  As evidenced by A1C 11.1%.    Intervention:  Nutrition and Diabetes education provided on My Plate, CHO counting, meal planning, portion sizes, timing of meals, avoiding snacks between meals unless having a low blood sugar, target ranges for A1C and blood sugars, signs/symptoms and treatment of hyper/hypoglycemia, monitoring blood sugars, taking medications as prescribed, benefits of exercising 30 minutes per day and prevention of complications  of DM.  Goals 1.  Lose 1-2 lbs per week.  2. Increase fresh fruits and vegetables. 3.  Drink water 4. Continue to exercise 60 minutes 4-5 times per week.   5. Only test 2 times per day .  Teaching Method Utilized: None Visual Auditory Hands on  Handouts given during visit include:  The Plate Method   Meal Plan Card  Diabetes Instructions  Barriers to learning/adherence to lifestyle change: none  Demonstrated degree of understanding via:  Teach Back   Monitoring/Evaluation:  Dietary intake, exercise, meal planning, SBG and body weight in 3 month(s).

## 2015-09-23 NOTE — Progress Notes (Signed)
Subjective:    Patient ID: Nathan [name], male    DOB: February 04, 1975,  .MANN, [name], PA-C   Past Medical History:  Diagnosis Date  . Cardiac arrest (HCC)   . Chest pain   . Diabetes mellitus   . HLD (hyperlipidemia)   . Hypertension   . Obesity   . Pneumonia   . Syncope   . Thyroid disease   . TMJ (dislocation of temporomandibular joint)    Past Surgical History:  Procedure Laterality Date  . CARDIAC CATHETERIZATION     Social History   Social History  . Marital status: Married    Spouse name: N/A  . Number of children: N/A  . Years of education: N/A   Social History Main Topics  . Smoking status: Former Smoker    Years: 6.00    Types: Cigarettes    Quit date: 01/25/2008  . Smokeless tobacco: Never Used  . Alcohol use 2.4 oz/week    4 Cans of beer per week     Comment: 3-4 beers weekly  . Drug use: No  . Sexual activity: Not Asked   Other Topics Concern  . None   Social History Narrative  . None   Outpatient Encounter Prescriptions as of 09/23/2015  Medication Sig  . amLODipine (NORVASC) 2.5 MG tablet Take 1 tablet (2.5 mg total) by mouth daily.  Marland Kitchen aspirin 81 MG tablet Take 81 mg by mouth daily.  Marland Kitchen atorvastatin (LIPITOR) 40 MG tablet Take 1 tablet (40 mg total) by mouth daily.  . canagliflozin (INVOKANA) 300 MG TABS tablet TAKE ONE TABLET BY MOUTH IN THE MORNING BEFORE  BREAKFAST  . doxycycline (VIBRAMYCIN) 100 MG capsule Take 1 capsule (100 mg total) by mouth 2 (two) times daily.  . insulin NPH-regular Human (NOVOLIN 70/30) (70-30) 100 UNIT/ML injection Inject 40 Units into the skin 2 (two) times daily with a meal.  . INSULIN SYRINGE .5CC/29G 29G X 1/2" 0.5 ML MISC Use to inject insulin 2 x a day  . levothyroxine (SYNTHROID, LEVOTHROID) 75 MCG tablet Take 1 tablet (75 mcg total) by mouth daily before breakfast.  . metFORMIN (GLUCOPHAGE) 1000 MG tablet TAKE ONE TABLET BY MOUTH TWICE DAILY WITH A MEAL  . [DISCONTINUED] insulin NPH-regular Human (NOVOLIN  70/30) (70-30) 100 UNIT/ML injection Inject 30 Units into the skin 2 (two) times daily with a meal.   No facility-administered encounter medications on file as of 09/23/2015.    ALLERGIES: Allergies  Allergen Reactions  . Benzocaine Anaphylaxis and Other (See Comments)    REACTION: Blistering all over and throat swells shut  . Orange Juice [Orange Oil] Anaphylaxis  . Grapefruit Concentrate    VACCINATION STATUS:  There is no immunization history on file for this patient.  Diabetes  He presents for his follow-up diabetic visit. He has type 2 diabetes mellitus. Onset time: He was diagnosed with type 2 diabetes at approximate age of 33 years. His disease course has been improving. There are no hypoglycemic associated symptoms. Pertinent negatives for hypoglycemia include no confusion, headaches, pallor or seizures. There are no diabetic associated symptoms. Pertinent negatives for diabetes include no chest pain, no fatigue, no polydipsia, no polyphagia, no polyuria and no weakness. There are no hypoglycemic complications. Symptoms are improving. Diabetic complications include heart disease. Risk factors for coronary artery disease include dyslipidemia, family history, diabetes mellitus, obesity, male sex and sedentary lifestyle. Current diabetic treatment includes oral agent (dual therapy) (He was supposed to pick up the prescription for Novolin 70/30  to take along with his metformin, invokana, and tanzeum ; however he  he did not take His insulin.). He is compliant with treatment most of the time. His weight is decreasing steadily. He is following a generally unhealthy diet. He has had a previous visit with a dietitian. He participates in exercise intermittently. Home blood sugar record trend: He came with  logs  showing better BG profile. His breakfast blood glucose range is generally >200 mg/dl. His dinner blood glucose range is generally >200 mg/dl. His overall blood glucose range is >200 mg/dl. An  ACE inhibitor/angiotensin II receptor blocker is not being taken. Eye exam is current.  Hyperglycemia  This is a chronic problem. The current episode started more than 1 year ago. Pertinent negatives include no abdominal pain, chest pain, coughing, fatigue, headaches, myalgias, nausea, neck pain, numbness, rash, vomiting or weakness. Treatments tried: He was supposed to be on Lovaza, Lipitor. However he is not on Lovaza for unclear reasons.  Hypertension  This is a chronic problem. The current episode started more than 1 year ago. The problem is uncontrolled. Pertinent negatives include no chest pain, headaches, neck pain, palpitations or shortness of breath. Past treatments include nothing. Hypertensive end-organ damage includes a thyroid problem.  Hyperlipidemia  This is a chronic problem. The current episode started more than 1 year ago. The problem is uncontrolled. Recent lipid tests were reviewed and are high. Exacerbating diseases include diabetes, hypothyroidism and obesity. Exacerbated by: Medical noncompliance. Pertinent negatives include no chest pain, myalgias or shortness of breath. Treatments tried: Was supposed to be on atorvastatin and Niaspan, liver patient did not take these medications for the last 2 months. Compliance problems include adherence to diet and medication cost.  Risk factors for coronary artery disease include dyslipidemia, family history, diabetes mellitus, male sex, obesity and a sedentary lifestyle.  Thyroid Problem  Presents for follow-up visit. Patient reports no constipation, diarrhea, fatigue or palpitations. The symptoms have been stable. Past treatments include levothyroxine. His past medical history is significant for diabetes and hyperlipidemia.     Review of Systems  Constitutional: Positive for unexpected weight change. Negative for fatigue.  HENT: Negative for dental problem, mouth sores and trouble swallowing.   Eyes: Negative for visual disturbance.   Respiratory: Negative for cough, choking, chest tightness, shortness of breath and wheezing.   Cardiovascular: Negative for chest pain, palpitations and leg swelling.  Gastrointestinal: Negative for abdominal distention, abdominal pain, constipation, diarrhea, nausea and vomiting.  Endocrine: Negative for polydipsia, polyphagia and polyuria.  Genitourinary: Negative for dysuria, flank pain, hematuria and urgency.  Musculoskeletal: Negative for back pain, gait problem, myalgias and neck pain.  Skin: Negative for pallor, rash and wound.  Neurological: Negative for seizures, syncope, weakness, numbness and headaches.  Psychiatric/Behavioral: Negative.  Negative for confusion and dysphoric mood.    Objective:    BP 135/87   Pulse 88   Ht 5\' 11"  (1.803 m)   Wt 231 lb (104.8 kg)   BMI 32.22 kg/m   Wt Readings from Last 3 Encounters:  09/23/15 231 lb (104.8 kg)  09/04/15 228 lb (103.4 kg)  08/23/15 233 lb (105.7 kg)    Physical Exam  Constitutional: He is oriented to person, place, and time. He appears well-developed and well-nourished. He is cooperative. No distress.  HENT:  Head: Normocephalic and atraumatic.  Eyes: EOM are normal.  Neck: Normal range of motion. Neck supple. No tracheal deviation present. No thyromegaly present.  Cardiovascular: Normal rate, S1 normal, S2 normal and normal  heart sounds.  Exam reveals no gallop.   No murmur heard. Pulses:      Dorsalis pedis pulses are 1+ on the right side, and 1+ on the left side.       Posterior tibial pulses are 1+ on the right side, and 1+ on the left side.  Pulmonary/Chest: Breath sounds normal. No respiratory distress. He has no wheezes.  Abdominal: Soft. Bowel sounds are normal. He exhibits no distension. There is no tenderness. There is no guarding and no CVA tenderness.  Musculoskeletal: He exhibits no edema.       Right shoulder: He exhibits no swelling and no deformity.  Neurological: He is alert and oriented to person,  place, and time. He has normal strength and normal reflexes. No cranial nerve deficit or sensory deficit. Gait normal.  Skin: Skin is warm and dry. No rash noted. No cyanosis. Nails show no clubbing.  Psychiatric: He has a normal mood and affect. His speech is normal and behavior is normal. Judgment and thought content normal. Cognition and memory are normal.    Chemistry (most recent): Lab Results  Component Value Date   NA 134 (L) 08/23/2015   K 3.5 08/23/2015   CL 102 08/23/2015   CO2 22 08/23/2015   BUN 16 08/23/2015   CREATININE 0.54 (L) 08/23/2015   Diabetic Labs (most recent): Lab Results  Component Value Date   HGBA1C 11 03/14/2015   HGBA1C 11.1 12/18/2014   HGBA1C 12.1 (H) 07/02/2010   Lipid Panel     Component Value Date/Time   CHOL 218 (H) 07/01/2010 0507   TRIG 440 (H) 07/01/2010 0507   HDL 30 (L) 07/01/2010 0507   CHOLHDL 7.3 07/01/2010 0507   VLDL UNABLE TO CALCULATE IF TRIGLYCERIDE OVER 400 mg/dL 40/98/1191 4782   LDLCALC  07/01/2010 0507    UNABLE TO CALCULATE IF TRIGLYCERIDE OVER 400 mg/dL        Total Cholesterol/HDL:CHD Risk Coronary Heart Disease Risk Table                     Men   Women  1/2 Average Risk   3.4   3.3  Average Risk       5.0   4.4  2 X Average Risk   9.6   7.1  3 X Average Risk  23.4   11.0        Use the calculated Patient Ratio above and the CHD Risk Table to determine the patient's CHD Risk.        ATP III CLASSIFICATION (LDL):  <100     mg/dL   Optimal  956-213  mg/dL   Near or Above                    Optimal  130-159  mg/dL   Borderline  086-578  mg/dL   High  >469     mg/dL   Very High     Assessment & Plan:   1. Uncontrolled type 2 diabetes mellitus with hyperosmolarity without coma, without long-term current use of insulin (HCC)  His diabetes is  complicated by severe hypertriglyceridemia and nonadherence to medication. Patient came with Slight improvement in his A1c to 9.7% from 11%, still consistent with  severely uncontrolled diabetes.     Recent labs reviewed. - Patient remains at a high risk for more acute and chronic complications of diabetes which include CAD, CVA, CKD, retinopathy, and neuropathy. These are all discussed in detail with the  patient.  - I have re-counseled the patient on diet management and weight loss  by adopting a carbohydrate restricted / protein rich  Diet. - Patient is advised to stick to a routine mealtimes to eat 3 meals  a day and avoid unnecessary snacks ( to snack only to correct hypoglycemia).  - Suggestion is made for patient to avoid simple carbohydrates   from their diet including Cakes , Desserts, Ice Cream,  Soda (  diet and regular) , Sweet Tea , Candies,  Chips, Cookies, Artificial Sweeteners,   and "Sugar-free" Products .  This will help patient to have stable blood glucose profile and potentially avoid unintended  Weight gain.  - The patient  will be  scheduled with Norm SaltPenny Crumpton, RDN, CDE for individualized DM education. - I have approached patient with the following individualized plan to manage diabetes and patient agrees.   - He would need basal/bolus insulin, however he could not afford the insulin analogs. -I  urged him to increase   premixed insulin Novolin 70/30  To 40 units with breakfast and 40 units with supper for pre-meal glucose of greater than 90 mg/DL associated with strict monitoring of glucose  AC and HS. these choice is because he cannot afford the insulin analogs. - Patient is warned not to take insulin without proper monitoring per orders. -Adjustment parameters are given for hypo and hyperglycemia in writing. -Patient is encouraged to call clinic for blood glucose levels less than 70 or above 300 mg /dl. - I discontinued  tanzeum   due to high risk of pancreatitis from severe hypertriglyceridemia at 1660 . - I will continue metformin 1000 mg by mouth twice a day, and Invokana 300 mg by mouth every morning, therapeutically suitable for  patient.  - Patient specific target  for A1c; LDL, HDL, Triglycerides, and  Waist Circumference were discussed in detail.  2) BP/HTN:  I have added lisinopril 20 mg by mouth every morning. He is counseled on the need to control hypertension to prevent complications.   3) Lipids/HPL: Recent lipid panel is reviewed with him showing triglycerides extremity elevated at 1660 mg per DL. I urged him to resume atorvastatin 40 mg by mouth daily at bedtime ,   and Niaspan 750 mg extended release once a day. unfortunately Lovaza is not covered by his insurance .  4)  obesity:  Agrees to CDE consult, exercise, and carbohydrates information provided.  5) hypothyroidism: He is advised to continue levothyroxine 75 g by mouth every morning.  - We discussed about correct intake of levothyroxine, at fasting, with water, separated by at least 30 minutes from breakfast, and separated by more than 4 hours from calcium, iron, multivitamins, acid reflux medications (PPIs). -Patient is made aware of the fact that thyroid hormone replacement is needed for life, dose to be adjusted by periodic monitoring of thyroid function tests.  6) Chronic Care/Health Maintenance:  -Patient is on  Statin medications and encouraged to continue to follow up with Ophthalmology, Podiatrist at least yearly or according to recommendations, and advised to stay away from smoking. I have recommended yearly flu vaccine and pneumonia vaccination at least every 5 years; moderate intensity exercise for up to 150 minutes weekly; and  sleep for at least 7 hours a day. 6) hypothyroidism: He would benefit from a slight increase in his levothyroxine dose. I would increase levothyroxine to 75 g by mouth every morning. Appropriate intake of levothyroxine discussed with the patient.  - 30 minutes of time was  spent on the care of this patient , 50% of which was applied for counseling on diabetes complications and their preventions.  I advised patient to  maintain close follow up with their PCP for primary care needs.  Patient is asked to bring meter and  blood glucose logs during their next visit.   Follow up plan: Return in about 3 months (around 12/24/2015) for follow up with pre-visit labs, meter, and logs.  Marquis Lunch, MD Phone: [phone]  Fax: [phone]   09/23/2015, 3:43 PM

## 2015-09-23 NOTE — Patient Instructions (Addendum)
Goals 1.  Lose 1-2 lbs per week.  2. Increase fresh fruits and vegetables. 3.  Drink water 4. Continue to exercise 60 minutes 4-5 times per week.  Test only twice a day now.

## 2015-10-08 ENCOUNTER — Encounter: Payer: Self-pay | Admitting: "Endocrinology

## 2015-10-23 ENCOUNTER — Other Ambulatory Visit: Payer: Self-pay | Admitting: "Endocrinology

## 2015-11-03 ENCOUNTER — Encounter: Payer: Self-pay | Admitting: Cardiovascular Disease

## 2015-11-03 ENCOUNTER — Ambulatory Visit: Payer: 59 | Admitting: Cardiovascular Disease

## 2015-12-22 ENCOUNTER — Other Ambulatory Visit: Payer: Self-pay | Admitting: "Endocrinology

## 2015-12-23 LAB — T4, FREE: FREE T4: 1.36 ng/dL (ref 0.82–1.77)

## 2015-12-23 LAB — LIPID PANEL W/O CHOL/HDL RATIO
CHOLESTEROL TOTAL: 235 mg/dL — AB (ref 100–199)
HDL: 37 mg/dL — ABNORMAL LOW (ref >39)
Triglycerides: 574 mg/dL (ref 0–149)

## 2015-12-23 LAB — COMPREHENSIVE METABOLIC PANEL
ALBUMIN: 4.8 g/dL (ref 3.5–5.5)
ALK PHOS: 90 IU/L (ref 39–117)
ALT: 52 IU/L — ABNORMAL HIGH (ref 0–44)
AST: 30 IU/L (ref 0–40)
Albumin/Globulin Ratio: 1.9 (ref 1.2–2.2)
BUN / CREAT RATIO: 12 (ref 9–20)
BUN: 9 mg/dL (ref 6–20)
Bilirubin Total: 0.6 mg/dL (ref 0.0–1.2)
CO2: 23 mmol/L (ref 18–29)
CREATININE: 0.73 mg/dL — AB (ref 0.76–1.27)
Calcium: 10.4 mg/dL — ABNORMAL HIGH (ref 8.7–10.2)
Chloride: 101 mmol/L (ref 96–106)
GFR calc non Af Amer: 118 mL/min/{1.73_m2} (ref >59)
GFR, EST AFRICAN AMERICAN: 136 mL/min/{1.73_m2} (ref >59)
GLOBULIN, TOTAL: 2.5 g/dL (ref 1.5–4.5)
Glucose: 178 mg/dL — ABNORMAL HIGH (ref 65–99)
Potassium: 4.3 mmol/L (ref 3.5–5.2)
SODIUM: 143 mmol/L (ref 134–144)
TOTAL PROTEIN: 7.3 g/dL (ref 6.0–8.5)

## 2015-12-23 LAB — HGB A1C W/O EAG: HEMOGLOBIN A1C: 8.5 % — AB (ref 4.8–5.6)

## 2015-12-23 LAB — TSH: TSH: 1.61 u[IU]/mL (ref 0.450–4.500)

## 2015-12-23 LAB — MICROALBUMIN, URINE: MICROALBUM., U, RANDOM: 19.8 ug/mL

## 2015-12-28 ENCOUNTER — Telehealth: Payer: Self-pay

## 2015-12-28 DIAGNOSIS — E118 Type 2 diabetes mellitus with unspecified complications: Principal | ICD-10-CM

## 2015-12-28 DIAGNOSIS — E1165 Type 2 diabetes mellitus with hyperglycemia: Secondary | ICD-10-CM

## 2015-12-28 NOTE — Telephone Encounter (Signed)
-----   Message from Mare LoanMary D Crumpton, RD sent at 12/28/2015  3:28 PM EST ----- Regarding: referral needede HI Can you put in a referral for him please Thanks Boyd Kerbsenny

## 2015-12-30 ENCOUNTER — Encounter: Payer: Medicaid Other | Attending: "Endocrinology | Admitting: Nutrition

## 2015-12-30 ENCOUNTER — Encounter: Payer: Self-pay | Admitting: "Endocrinology

## 2015-12-30 ENCOUNTER — Ambulatory Visit (INDEPENDENT_AMBULATORY_CARE_PROVIDER_SITE_OTHER): Payer: PRIVATE HEALTH INSURANCE | Admitting: "Endocrinology

## 2015-12-30 VITALS — Wt 231.0 lb

## 2015-12-30 VITALS — BP 136/80 | HR 82 | Ht 71.0 in | Wt 231.0 lb

## 2015-12-30 DIAGNOSIS — E039 Hypothyroidism, unspecified: Secondary | ICD-10-CM | POA: Diagnosis not present

## 2015-12-30 DIAGNOSIS — E118 Type 2 diabetes mellitus with unspecified complications: Secondary | ICD-10-CM

## 2015-12-30 DIAGNOSIS — Z713 Dietary counseling and surveillance: Secondary | ICD-10-CM | POA: Insufficient documentation

## 2015-12-30 DIAGNOSIS — E119 Type 2 diabetes mellitus without complications: Secondary | ICD-10-CM | POA: Diagnosis not present

## 2015-12-30 DIAGNOSIS — E1159 Type 2 diabetes mellitus with other circulatory complications: Secondary | ICD-10-CM

## 2015-12-30 DIAGNOSIS — E782 Mixed hyperlipidemia: Secondary | ICD-10-CM

## 2015-12-30 DIAGNOSIS — IMO0002 Reserved for concepts with insufficient information to code with codable children: Secondary | ICD-10-CM

## 2015-12-30 DIAGNOSIS — Z794 Long term (current) use of insulin: Secondary | ICD-10-CM

## 2015-12-30 DIAGNOSIS — I10 Essential (primary) hypertension: Secondary | ICD-10-CM | POA: Diagnosis not present

## 2015-12-30 DIAGNOSIS — E1165 Type 2 diabetes mellitus with hyperglycemia: Secondary | ICD-10-CM

## 2015-12-30 DIAGNOSIS — E669 Obesity, unspecified: Secondary | ICD-10-CM

## 2015-12-30 MED ORDER — CANAGLIFLOZIN 100 MG PO TABS: ORAL_TABLET | ORAL | 3 refills | Status: DC

## 2015-12-30 NOTE — Patient Instructions (Addendum)
Goals 1.  Increase physical activity:  Walk daily. 2. Eat 2 svg of low carb vegetables with lunch and dinner daily 3. Eat three pieces of fruit per day; one with each meal Keep up the good work!! Lose 10 lbs in the next three months. Keep drinking water

## 2015-12-30 NOTE — Progress Notes (Signed)
  Medical Nutrition Therapy:  Appt start time: 1100 end time:  1130.  Assessment:  Primary concerns today:  Diabetes Type 2.  Saw Dr. Fransico HimNida today. He is here with his wife. Saw Dr .Fransico HimNida. Going up on 70/30 up to 50 units now.  A1C down to 8.5% from 9.1%. Wt stable. Not able to walk a lot due to knee and feet.  Says his feet is burning.    Still needs to work more on his diet and cut out high fat and processed fast foods. Diet improving  Slowly but still needs more vegetables, fresh fruits and whole grains.   Lab Results  Component Value Date   HGBA1C 8.5 (H) 12/22/2015    Preferred Learning Style:   No preference indicated   Learning Readiness:   Ready  Change in progress  MEDICATIONS: see list   DIETARY INTAKE:   24-hr recall:  B ( AM):  Eggs and sausage and biscuit, water,  Coffee or bran cereal Snk ( AM):  L ( PM): Ham sandwich on wheat bread, small bag of chips, water Snk ( PM):  D ( PM):   Big Mac and water: Usually deer meat, carrots, mashed potatoes with gravy. water Snk ( PM): none Beverages: water  Usual physical activity:  Walk 2 miles   Estimated energy needs: 1800 calories 200 g carbohydrates 135 g protein 50 g fat  Progress Towards Goal(s):  In progress.   Nutritional Diagnosis:  NB-1.1 Food and nutrition-related knowledge deficit As related to Diabetes.  As evidenced by A1C 11.1%.    Intervention:  Nutrition and Diabetes education provided on My Plate, CHO counting, meal planning, portion sizes, timing of meals, avoiding snacks between meals unless having a low blood sugar, target ranges for A1C and blood sugars, signs/symptoms and treatment of hyper/hypoglycemia, monitoring blood sugars, taking medications as prescribed, benefits of exercising 30 minutes per day and prevention of complications of DM.  Goals 1.  Increase physical activity:  Walk daily. 2. Eat 2 svg of low carb vegetables with lunch and dinner daily 3. Eat three pieces of fruit per  day; one with each meal Keep up the good work!! Lose 10 lbs in the next three months. Keep drinking water  Teaching Method Utilized: None Visual Auditory Hands on  Handouts given during visit include:  The Plate Method   Meal Plan Card  Diabetes Instructions  Barriers to learning/adherence to lifestyle change: none  Demonstrated degree of understanding via:  Teach Back   Monitoring/Evaluation:  Dietary intake, exercise, meal planning, SBG and body weight in 3 month(s).

## 2015-12-30 NOTE — Progress Notes (Signed)
Subjective:    Patient ID: Nathan [name], male    DOB: 1975-07-09,  .MANN, [name], PA-C   Past Medical History:  Diagnosis Date  . Cardiac arrest (HCC)   . Chest pain   . Diabetes mellitus   . HLD (hyperlipidemia)   . Hypertension   . Obesity   . Pneumonia   . Syncope   . Thyroid disease   . TMJ (dislocation of temporomandibular joint)    Past Surgical History:  Procedure Laterality Date  . CARDIAC CATHETERIZATION     Social History   Social History  . Marital status: Married    Spouse name: N/A  . Number of children: N/A  . Years of education: N/A   Social History Main Topics  . Smoking status: Former Smoker    Years: 6.00    Types: Cigarettes    Quit date: 01/25/2008  . Smokeless tobacco: Never Used  . Alcohol use 2.4 oz/week    4 Cans of beer per week     Comment: 3-4 beers weekly  . Drug use: No  . Sexual activity: Not Asked   Other Topics Concern  . None   Social History Narrative  . None   Outpatient Encounter Prescriptions as of 12/30/2015  Medication Sig  . amLODipine (NORVASC) 2.5 MG tablet Take 1 tablet (2.5 mg total) by mouth daily.  Marland Kitchen aspirin 81 MG tablet Take 81 mg by mouth daily.  Marland Kitchen atorvastatin (LIPITOR) 40 MG tablet Take 1 tablet (40 mg total) by mouth daily.  . canagliflozin (INVOKANA) 100 MG TABS tablet TAKE ONE TABLET BY MOUTH IN THE MORNING BEFORE  BREAKFAST  . doxycycline (VIBRAMYCIN) 100 MG capsule Take 1 capsule (100 mg total) by mouth 2 (two) times daily.  . insulin NPH-regular Human (NOVOLIN 70/30) (70-30) 100 UNIT/ML injection 40 units bid  . INSULIN SYRINGE .5CC/29G 29G X 1/2" 0.5 ML MISC Use to inject insulin 2 x a day  . levothyroxine (SYNTHROID, LEVOTHROID) 75 MCG tablet Take 1 tablet (75 mcg total) by mouth daily before breakfast.  . metFORMIN (GLUCOPHAGE) 1000 MG tablet TAKE ONE TABLET BY MOUTH TWICE DAILY WITH A MEAL  . [DISCONTINUED] canagliflozin (INVOKANA) 300 MG TABS tablet TAKE ONE TABLET BY MOUTH IN THE MORNING  BEFORE  BREAKFAST   No facility-administered encounter medications on file as of 12/30/2015.    ALLERGIES: Allergies  Allergen Reactions  . Benzocaine Anaphylaxis and Other (See Comments)    REACTION: Blistering all over and throat swells shut  . Orange Juice [Orange Oil] Anaphylaxis  . Grapefruit Concentrate    VACCINATION STATUS:  There is no immunization history on file for this patient.  Diabetes  He presents for his follow-up diabetic visit. He has type 2 diabetes mellitus. Onset time: He was diagnosed with type 2 diabetes at approximate age of 33 years. His disease course has been improving. There are no hypoglycemic associated symptoms. Pertinent negatives for hypoglycemia include no confusion, headaches, pallor or seizures. There are no diabetic associated symptoms. Pertinent negatives for diabetes include no chest pain, no fatigue, no polydipsia, no polyphagia, no polyuria and no weakness. There are no hypoglycemic complications. Symptoms are improving. Diabetic complications include heart disease. Risk factors for coronary artery disease include dyslipidemia, family history, diabetes mellitus, obesity, male sex and sedentary lifestyle. Current diabetic treatment includes oral agent (dual therapy) (He was supposed to pick up the prescription for Novolin 70/30 to take along with his metformin, invokana, and tanzeum ; however he  he did  not take His insulin.). He is compliant with treatment most of the time. His weight is decreasing steadily. He is following a generally unhealthy diet. He has had a previous visit with a dietitian. He participates in exercise intermittently. Home blood sugar record trend: He came with  logs  showing better BG profile. His breakfast blood glucose range is generally 180-200 mg/dl. His highest blood glucose is 180-200 mg/dl. His overall blood glucose range is 180-200 mg/dl. An ACE inhibitor/angiotensin II receptor blocker is not being taken. Eye exam is current.   Hyperglycemia  This is a chronic problem. The current episode started more than 1 year ago. Pertinent negatives include no abdominal pain, chest pain, coughing, fatigue, headaches, myalgias, nausea, neck pain, numbness, rash, vomiting or weakness. Treatments tried: He was supposed to be on Lovaza, Lipitor. However he is not on Lovaza for unclear reasons.  Hypertension  This is a chronic problem. The current episode started more than 1 year ago. The problem is uncontrolled. Pertinent negatives include no chest pain, headaches, neck pain, palpitations or shortness of breath. Past treatments include nothing. Hypertensive end-organ damage includes a thyroid problem.  Hyperlipidemia  This is a chronic problem. The current episode started more than 1 year ago. The problem is uncontrolled. Recent lipid tests were reviewed and are high. Exacerbating diseases include diabetes, hypothyroidism and obesity. Exacerbated by: Medical noncompliance. Pertinent negatives include no chest pain, myalgias or shortness of breath. Treatments tried: Was supposed to be on atorvastatin and Niaspan, liver patient did not take these medications for the last 2 months. Compliance problems include adherence to diet and medication cost.  Risk factors for coronary artery disease include dyslipidemia, family history, diabetes mellitus, male sex, obesity and a sedentary lifestyle.  Thyroid Problem  Presents for follow-up visit. Patient reports no constipation, diarrhea, fatigue or palpitations. The symptoms have been stable. Past treatments include levothyroxine. His past medical history is significant for diabetes and hyperlipidemia.    Review of Systems  Constitutional: Positive for unexpected weight change. Negative for fatigue.  HENT: Negative for dental problem, mouth sores and trouble swallowing.   Eyes: Negative for visual disturbance.  Respiratory: Negative for cough, choking, chest tightness, shortness of breath and wheezing.    Cardiovascular: Negative for chest pain, palpitations and leg swelling.  Gastrointestinal: Negative for abdominal distention, abdominal pain, constipation, diarrhea, nausea and vomiting.  Endocrine: Negative for polydipsia, polyphagia and polyuria.  Genitourinary: Negative for dysuria, flank pain, hematuria and urgency.  Musculoskeletal: Negative for back pain, gait problem, myalgias and neck pain.  Skin: Negative for pallor, rash and wound.  Neurological: Negative for seizures, syncope, weakness, numbness and headaches.  Psychiatric/Behavioral: Negative.  Negative for confusion and dysphoric mood.    Objective:    BP 136/80   Pulse 82   Ht 5\' 11"  (1.803 m)   Wt 231 lb (104.8 kg)   BMI 32.22 kg/m   Wt Readings from Last 3 Encounters:  12/30/15 231 lb (104.8 kg)  09/23/15 231 lb (104.8 kg)  09/23/15 231 lb (104.8 kg)    Physical Exam  Constitutional: He is oriented to person, place, and time. He appears well-developed and well-nourished. He is cooperative. No distress.  HENT:  Head: Normocephalic and atraumatic.  Eyes: EOM are normal.  Neck: Normal range of motion. Neck supple. No tracheal deviation present. No thyromegaly present.  Cardiovascular: Normal rate, S1 normal, S2 normal and normal heart sounds.  Exam reveals no gallop.   No murmur heard. Pulses:  Dorsalis pedis pulses are 1+ on the right side, and 1+ on the left side.       Posterior tibial pulses are 1+ on the right side, and 1+ on the left side.  Pulmonary/Chest: Breath sounds normal. No respiratory distress. He has no wheezes.  Abdominal: Soft. Bowel sounds are normal. He exhibits no distension. There is no tenderness. There is no guarding and no CVA tenderness.  Musculoskeletal: He exhibits no edema.       Right shoulder: He exhibits no swelling and no deformity.  Neurological: He is alert and oriented to person, place, and time. He has normal strength and normal reflexes. No cranial nerve deficit or  sensory deficit. Gait normal.  Skin: Skin is warm and dry. No rash noted. No cyanosis. Nails show no clubbing.  Psychiatric: He has a normal mood and affect. His speech is normal and behavior is normal. Judgment and thought content normal. Cognition and memory are normal.    Chemistry (most recent): Lab Results  Component Value Date   NA 143 12/22/2015   K 4.3 12/22/2015   CL 101 12/22/2015   CO2 23 12/22/2015   BUN 9 12/22/2015   CREATININE 0.73 (L) 12/22/2015   Diabetic Labs (most recent): Lab Results  Component Value Date   HGBA1C 8.5 (H) 12/22/2015   HGBA1C 11 03/14/2015   HGBA1C 11.1 12/18/2014   Lipid Panel     Component Value Date/Time   CHOL 235 (H) 12/22/2015 1040   TRIG 574 (HH) 12/22/2015 1040   HDL 37 (L) 12/22/2015 1040   CHOLHDL 7.3 07/01/2010 0507   VLDL UNABLE TO CALCULATE IF TRIGLYCERIDE OVER 400 mg/dL 16/10/9602 5409   LDLCALC Comment 12/22/2015 1040     Assessment & Plan:   1. Uncontrolled type 2 diabetes mellitus with hyperosmolarity without coma, without long-term current use of insulin (HCC)  His diabetes is  complicated by severe hypertriglyceridemia and nonadherence to medication. Patient came with  improvement in his A1c to 8.5% from 11%, still consistent with uncontrolled diabetes.     Recent labs reviewed. - Patient remains at a high risk for more acute and chronic complications of diabetes which include CAD, CVA, CKD, retinopathy, and neuropathy. These are all discussed in detail with the patient.  - I have re-counseled the patient on diet management and weight loss  by adopting a carbohydrate restricted / protein rich  Diet. - Patient is advised to stick to a routine mealtimes to eat 3 meals  a day and avoid unnecessary snacks ( to snack only to correct hypoglycemia).  - Suggestion is made for patient to avoid simple carbohydrates   from their diet including Cakes , Desserts, Ice Cream,  Soda (  diet and regular) , Sweet Tea , Candies,   Chips, Cookies, Artificial Sweeteners,   and "Sugar-free" Products .  This will help patient to have stable blood glucose profile and potentially avoid unintended  Weight gain.  - The patient  will be  scheduled with Norm Salt, RDN, CDE for individualized DM education. - I have approached patient with the following individualized plan to manage diabetes and patient agrees.   - He would need basal/bolus insulin, however he could not afford the insulin analogs. -I  urged him to increase   premixed insulin Novolin 70/30  to 40 units with breakfast and 50 units with supper for pre-meal glucose of greater than 90 mg/DL associated with strict monitoring of glucose  AC and HS. these choice is because he cannot  afford the insulin analogs. - Patient is warned not to take insulin without proper monitoring per orders. -Adjustment parameters are given for hypo and hyperglycemia in writing. -Patient is encouraged to call clinic for blood glucose levels less than 70 or above 300 mg /dl. - I discontinued  tanzeum   due to high risk of pancreatitis from severe hypertriglyceridemia at 1660 . - I will continue metformin 1000 mg by mouth twice a day, and Invokana  Lowered to 100 mg by mouth every morning, therapeutically suitable for patient.  - Patient specific target  for A1c; LDL, HDL, Triglycerides, and  Waist Circumference were discussed in detail.  2) BP/HTN:  I have added lisinopril 20 mg by mouth every morning. He is counseled on the need to control hypertension to prevent complications.   3) Lipids/HPL: Recent lipid panel is reviewed with him showing triglycerides improving to 574 from 1660 mg per DL. I urged him to resume atorvastatin 40 mg by mouth daily at bedtime ,   and Niaspan 750 mg extended release once a day. unfortunately Lovaza is not covered by his insurance .  4)  obesity:  Agrees to CDE consult, exercise, and carbohydrates information provided.  5) hypothyroidism: He is advised to  continue levothyroxine 75 g by mouth every morning.  - We discussed about correct intake of levothyroxine, at fasting, with water, separated by at least 30 minutes from breakfast, and separated by more than 4 hours from calcium, iron, multivitamins, acid reflux medications (PPIs). -Patient is made aware of the fact that thyroid hormone replacement is needed for life, dose to be adjusted by periodic monitoring of thyroid function tests.  6) Chronic Care/Health Maintenance:  -Patient is on  Statin medications and encouraged to continue to follow up with Ophthalmology, Podiatrist at least yearly or according to recommendations, and advised to stay away from smoking. I have recommended yearly flu vaccine and pneumonia vaccination at least every 5 years; moderate intensity exercise for up to 150 minutes weekly; and  sleep for at least 7 hours a day. 6) hypothyroidism: He would benefit from a slight increase in his levothyroxine dose. I would increase levothyroxine to 75 g by mouth every morning. Appropriate intake of levothyroxine discussed with the patient.  - 30 minutes of time was spent on the care of this patient , 50% of which was applied for counseling on diabetes complications and their preventions.  I advised patient to maintain close follow up with their PCP for primary care needs.  Patient is asked to bring meter and  blood glucose logs during their next visit.   Follow up plan: Return in about 3 months (around 03/29/2016) for follow up with pre-visit labs, meter, and logs.  Marquis LunchGebre Lillyana Majette, MD Phone: [phone]  Fax: [phone]   12/30/2015, 2:31 PM

## 2015-12-30 NOTE — Patient Instructions (Signed)

## 2016-03-13 ENCOUNTER — Other Ambulatory Visit: Payer: Self-pay | Admitting: "Endocrinology

## 2016-03-30 ENCOUNTER — Other Ambulatory Visit: Payer: Self-pay | Admitting: "Endocrinology

## 2016-04-01 ENCOUNTER — Ambulatory Visit: Payer: PRIVATE HEALTH INSURANCE | Admitting: "Endocrinology

## 2016-04-02 ENCOUNTER — Other Ambulatory Visit: Payer: Self-pay | Admitting: "Endocrinology

## 2016-04-03 LAB — COMPLETE METABOLIC PANEL WITH GFR
ALT: 40 U/L (ref 9–46)
AST: 21 U/L (ref 10–40)
Albumin: 4.1 g/dL (ref 3.6–5.1)
Alkaline Phosphatase: 90 U/L (ref 40–115)
BUN: 9 mg/dL (ref 7–25)
CALCIUM: 9.5 mg/dL (ref 8.6–10.3)
CO2: 25 mmol/L (ref 20–31)
CREATININE: 0.68 mg/dL (ref 0.60–1.35)
Chloride: 101 mmol/L (ref 98–110)
GFR, Est African American: >89 mL/min (ref >=60)
GFR, Est Non African American: >89 mL/min (ref >=60)
GLUCOSE: 280 mg/dL — AB (ref 65–99)
POTASSIUM: 4.7 mmol/L (ref 3.5–5.3)
SODIUM: 135 mmol/L (ref 135–146)
Total Bilirubin: 0.7 mg/dL (ref 0.2–1.2)
Total Protein: 6.5 g/dL (ref 6.1–8.1)

## 2016-04-03 LAB — MICROALBUMIN / CREATININE URINE RATIO
CREATININE, URINE: 72 mg/dL (ref 20–370)
Microalb Creat Ratio: 42 mcg/mg creat — ABNORMAL HIGH (ref <30)
Microalb, Ur: 3 mg/dL

## 2016-04-04 LAB — HEMOGLOBIN A1C
HEMOGLOBIN A1C: 8.6 % — AB (ref <5.7)
MEAN PLASMA GLUCOSE: 200 mg/dL

## 2016-04-07 ENCOUNTER — Ambulatory Visit: Payer: Medicaid Other | Admitting: Nutrition

## 2016-04-08 ENCOUNTER — Ambulatory Visit (INDEPENDENT_AMBULATORY_CARE_PROVIDER_SITE_OTHER): Payer: Medicaid Other | Admitting: "Endocrinology

## 2016-04-08 ENCOUNTER — Encounter: Payer: Self-pay | Admitting: "Endocrinology

## 2016-04-08 VITALS — BP 136/84 | HR 91 | Ht 71.0 in | Wt 240.0 lb

## 2016-04-08 DIAGNOSIS — E039 Hypothyroidism, unspecified: Secondary | ICD-10-CM

## 2016-04-08 DIAGNOSIS — Z9119 Patient's noncompliance with other medical treatment and regimen: Secondary | ICD-10-CM | POA: Insufficient documentation

## 2016-04-08 DIAGNOSIS — I10 Essential (primary) hypertension: Secondary | ICD-10-CM

## 2016-04-08 DIAGNOSIS — E782 Mixed hyperlipidemia: Secondary | ICD-10-CM

## 2016-04-08 DIAGNOSIS — Z91199 Patient's noncompliance with other medical treatment and regimen due to unspecified reason: Secondary | ICD-10-CM | POA: Insufficient documentation

## 2016-04-08 DIAGNOSIS — E1159 Type 2 diabetes mellitus with other circulatory complications: Secondary | ICD-10-CM

## 2016-04-08 MED ORDER — LEVOTHYROXINE SODIUM 75 MCG PO TABS: 75.0000 ug | ORAL_TABLET | Freq: Every day | ORAL | 2 refills | Status: DC

## 2016-04-08 NOTE — Progress Notes (Signed)
Subjective:    Patient ID: Nathan [name], male    DOB: 03/28/1975,  .MANN, [name], PA-C   Past Medical History:  Diagnosis Date  . Cardiac arrest (HCC)   . Chest pain   . Diabetes mellitus   . HLD (hyperlipidemia)   . Hypertension   . Obesity   . Pneumonia   . Syncope   . Thyroid disease   . TMJ (dislocation of temporomandibular joint)    Past Surgical History:  Procedure Laterality Date  . CARDIAC CATHETERIZATION     Social History   Social History  . Marital status: Married    Spouse name: N/A  . Number of children: N/A  . Years of education: N/A   Social History Main Topics  . Smoking status: Former Smoker    Years: 6.00    Types: Cigarettes    Quit date: 01/25/2008  . Smokeless tobacco: Never Used  . Alcohol use 2.4 oz/week    4 Cans of beer per week     Comment: 3-4 beers weekly  . Drug use: No  . Sexual activity: Not Asked   Other Topics Concern  . None   Social History Narrative  . None   Outpatient Encounter Prescriptions as of 04/08/2016  Medication Sig  . amLODipine (NORVASC) 2.5 MG tablet Take 1 tablet (2.5 mg total) by mouth daily.  Marland Kitchen aspirin 81 MG tablet Take 81 mg by mouth daily.  Marland Kitchen atorvastatin (LIPITOR) 40 MG tablet Take 1 tablet (40 mg total) by mouth daily.  . canagliflozin (INVOKANA) 100 MG TABS tablet TAKE ONE TABLET BY MOUTH IN THE MORNING BEFORE  BREAKFAST  . insulin NPH-regular Human (NOVOLIN 70/30) (70-30) 100 UNIT/ML injection 40 units bid  . levothyroxine (SYNTHROID, LEVOTHROID) 75 MCG tablet Take 1 tablet (75 mcg total) by mouth daily before breakfast.  . metFORMIN (GLUCOPHAGE) 1000 MG tablet TAKE 1 TABLET TWICE DAILY  . ULTICARE INSULIN SYRINGE 29G X 1/2" 0.5 ML MISC USE TWICE A DAY WITH INSULIN  . [DISCONTINUED] doxycycline (VIBRAMYCIN) 100 MG capsule Take 1 capsule (100 mg total) by mouth 2 (two) times daily.  . [DISCONTINUED] levothyroxine (SYNTHROID, LEVOTHROID) 75 MCG tablet Take 1 tablet (75 mcg total) by mouth daily  before breakfast.   No facility-administered encounter medications on file as of 04/08/2016.    ALLERGIES: Allergies  Allergen Reactions  . Benzocaine Anaphylaxis and Other (See Comments)    REACTION: Blistering all over and throat swells shut  . Orange Juice [Orange Oil] Anaphylaxis  . Grapefruit Concentrate    VACCINATION STATUS:  There is no immunization history on file for this patient.  Diabetes  He presents for his follow-up diabetic visit. He has type 2 diabetes mellitus. Onset time: He was diagnosed with type 2 diabetes at approximate age of 33 years. His disease course has been worsening. There are no hypoglycemic associated symptoms. Pertinent negatives for hypoglycemia include no confusion, headaches, pallor or seizures. There are no diabetic associated symptoms. Pertinent negatives for diabetes include no chest pain, no fatigue, no polydipsia, no polyphagia, no polyuria and no weakness. There are no hypoglycemic complications. Symptoms are worsening. Diabetic complications include heart disease. Risk factors for coronary artery disease include dyslipidemia, family history, diabetes mellitus, obesity, male sex and sedentary lifestyle. Current diabetic treatment includes oral agent (dual therapy) (He was supposed to pick up the prescription for Novolin 70/30 to take along with his metformin, invokana, and tanzeum ; however he  he did not take His insulin.). He is  compliant with treatment most of the time. His weight is decreasing steadily. He is following a generally unhealthy diet. He has had a previous visit with a dietitian. He participates in exercise intermittently. Blood glucose monitoring compliance is poor. His overall blood glucose range is >200 mg/dl. (He has monitored only one time a day average and more than 200 mg/dL.) An ACE inhibitor/angiotensin II receptor blocker is not being taken. Eye exam is current.  Hyperglycemia  This is a chronic problem. The current episode  started more than 1 year ago. Pertinent negatives include no abdominal pain, chest pain, coughing, fatigue, headaches, myalgias, nausea, neck pain, numbness, rash, vomiting or weakness. Treatments tried: He was supposed to be on Lovaza, Lipitor. However he is not on Lovaza for unclear reasons.  Hypertension  This is a chronic problem. The current episode started more than 1 year ago. The problem is uncontrolled. Pertinent negatives include no chest pain, headaches, neck pain, palpitations or shortness of breath. Past treatments include nothing. Identifiable causes of hypertension include a thyroid problem.  Hyperlipidemia  This is a chronic problem. The current episode started more than 1 year ago. The problem is uncontrolled. Recent lipid tests were reviewed and are high. Exacerbating diseases include diabetes, hypothyroidism and obesity. Exacerbated by: Medical noncompliance. Pertinent negatives include no chest pain, myalgias or shortness of breath. Treatments tried: Was supposed to be on atorvastatin and Niaspan, liver patient did not take these medications for the last 2 months. Compliance problems include adherence to diet and medication cost.  Risk factors for coronary artery disease include dyslipidemia, family history, diabetes mellitus, male sex, obesity and a sedentary lifestyle.  Thyroid Problem  Presents for follow-up visit. Patient reports no constipation, diarrhea, fatigue or palpitations. The symptoms have been stable. Past treatments include levothyroxine. His past medical history is significant for diabetes and hyperlipidemia.    Review of Systems  Constitutional: Positive for unexpected weight change. Negative for fatigue.  HENT: Negative for dental problem, mouth sores and trouble swallowing.   Eyes: Negative for visual disturbance.  Respiratory: Negative for cough, choking, chest tightness, shortness of breath and wheezing.   Cardiovascular: Negative for chest pain, palpitations  and leg swelling.  Gastrointestinal: Negative for abdominal distention, abdominal pain, constipation, diarrhea, nausea and vomiting.  Endocrine: Negative for polydipsia, polyphagia and polyuria.  Genitourinary: Negative for dysuria, flank pain, hematuria and urgency.  Musculoskeletal: Negative for back pain, gait problem, myalgias and neck pain.  Skin: Negative for pallor, rash and wound.  Neurological: Negative for seizures, syncope, weakness, numbness and headaches.  Psychiatric/Behavioral: Negative.  Negative for confusion and dysphoric mood.    Objective:    BP 136/84   Pulse 91   Ht 5\' 11"  (1.803 m)   Wt 240 lb (108.9 kg)   BMI 33.47 kg/m   Wt Readings from Last 3 Encounters:  04/08/16 240 lb (108.9 kg)  12/30/15 231 lb (104.8 kg)  12/30/15 231 lb (104.8 kg)    Physical Exam  Constitutional: He is oriented to person, place, and time. He appears well-developed and well-nourished. He is cooperative. No distress.  HENT:  Head: Normocephalic and atraumatic.  Eyes: EOM are normal.  Neck: Normal range of motion. Neck supple. No tracheal deviation present. No thyromegaly present.  Cardiovascular: Normal rate, S1 normal, S2 normal and normal heart sounds.  Exam reveals no gallop.   No murmur heard. Pulses:      Dorsalis pedis pulses are 1+ on the right side, and 1+ on the left side.  Posterior tibial pulses are 1+ on the right side, and 1+ on the left side.  Pulmonary/Chest: Breath sounds normal. No respiratory distress. He has no wheezes.  Abdominal: Soft. Bowel sounds are normal. He exhibits no distension. There is no tenderness. There is no guarding and no CVA tenderness.  Musculoskeletal: He exhibits no edema.       Right shoulder: He exhibits no swelling and no deformity.  Neurological: He is alert and oriented to person, place, and time. He has normal strength and normal reflexes. No cranial nerve deficit or sensory deficit. Gait normal.  Skin: Skin is warm and dry. No  rash noted. No cyanosis. Nails show no clubbing.  Psychiatric: He has a normal mood and affect. His speech is normal and behavior is normal. Judgment and thought content normal. Cognition and memory are normal.    Chemistry (most recent): Lab Results  Component Value Date   NA 135 04/02/2016   K 4.7 04/02/2016   CL 101 04/02/2016   CO2 25 04/02/2016   BUN 9 04/02/2016   CREATININE 0.68 04/02/2016   Diabetic Labs (most recent): Lab Results  Component Value Date   HGBA1C 8.6 (H) 04/02/2016   HGBA1C 8.5 (H) 12/22/2015   HGBA1C 11 03/14/2015   Lipid Panel     Component Value Date/Time   CHOL 235 (H) 12/22/2015 1040   TRIG 574 (HH) 12/22/2015 1040   HDL 37 (L) 12/22/2015 1040   CHOLHDL 7.3 07/01/2010 0507   VLDL UNABLE TO CALCULATE IF TRIGLYCERIDE OVER 400 mg/dL 16/10/9602 5409   LDLCALC Comment 12/22/2015 1040     Assessment & Plan:   1. Uncontrolled type 2 diabetes mellitus with hyperosmolarity without coma, without long-term current use of insulin (HCC)  His diabetes is  complicated by severe hypertriglyceridemia and nonadherence to medication. Patient came with A1c is still high at 8.6%, although he has improved progressively from  11%, still consistent with uncontrolled diabetes.     Recent labs reviewed. - Patient remains at a high risk for more acute and chronic complications of diabetes which include CAD, CVA, CKD, retinopathy, and neuropathy. These are all discussed in detail with the patient.  - I have re-counseled the patient on diet management and weight loss  by adopting a carbohydrate restricted / protein rich  Diet. - Patient is advised to stick to a routine mealtimes to eat 3 meals  a day and avoid unnecessary snacks ( to snack only to correct hypoglycemia).  - Suggestion is made for patient to avoid simple carbohydrates   from their diet including Cakes , Desserts, Ice Cream,  Soda (  diet and regular) , Sweet Tea , Candies,  Chips, Cookies, Artificial  Sweeteners,   and "Sugar-free" Products .  This will help patient to have stable blood glucose profile and potentially avoid unintended  Weight gain.  - The patient  will be  scheduled with Norm Salt, RDN, CDE for individualized DM education. - I have approached patient with the following individualized plan to manage diabetes and patient agrees.   - He would need basal/bolus insulin, however he could not afford the insulin analogs. - He didn't commit properly for monitoring of blood glucose for optimal use of insulin.  - Testing only one time a day would not allow him to use even premixed Novolin 70/30/3070.  -I  urged him to recall meet and start monitoring blood glucose before breakfast and before supper and continue    premixed insulin Novolin 70/30  50 units  with breakfast and 40 units with supper for pre-meal glucose of greater than 90 mg/DL.  - Patient is warned not to take insulin without proper monitoring per orders. -Adjustment parameters are given for hypo and hyperglycemia in writing. -Patient is encouraged to call clinic for blood glucose levels less than 70 or above 300 mg /dl. - I discontinued  tanzeum   due to high risk of pancreatitis from severe hypertriglyceridemia at 1660 . - I will continue metformin 1000 mg by mouth twice a day, and Invokana  100 mg by mouth every morning, therapeutically suitable for patient.  - Patient specific target  for A1c; LDL, HDL, Triglycerides, and  Waist Circumference were discussed in detail.  2) BP/HTN:  I have added lisinopril 20 mg by mouth every morning. He is counseled on the need to control hypertension to prevent complications.   3) Lipids/HPL: Recent lipid panel is reviewed with him showing triglycerides improving to 574 from 1660 mg per DL. I urged him to resume atorvastatin 40 mg by mouth daily at bedtime ,   and Niaspan 750 mg extended release once a day. unfortunately Lovaza is not covered by his insurance .  4)  obesity:   Agrees to CDE consult, exercise, and carbohydrates information provided.  5) hypothyroidism: He is advised to continue levothyroxine 75 g by mouth every morning.  - We discussed about correct intake of levothyroxine, at fasting, with water, separated by at least 30 minutes from breakfast, and separated by more than 4 hours from calcium, iron, multivitamins, acid reflux medications (PPIs). -Patient is made aware of the fact that thyroid hormone replacement is needed for life, dose to be adjusted by periodic monitoring of thyroid function tests.  6) Chronic Care/Health Maintenance:  -Patient is on  Statin medications and encouraged to continue to follow up with Ophthalmology, Podiatrist at least yearly or according to recommendations, and advised to stay away from smoking. I have recommended yearly flu vaccine and pneumonia vaccination at least every 5 years; moderate intensity exercise for up to 150 minutes weekly; and  sleep for at least 7 hours a day.   - 30 minutes of time was spent on the care of this patient , 50% of which was applied for counseling on diabetes complications and their preventions.  I advised patient to maintain close follow up with their PCP for primary care needs.  Patient is asked to bring meter and  blood glucose logs during their next visit.   Follow up plan: Return in about 3 months (around 07/09/2016) for follow up with pre-visit labs, meter, and logs.  Marquis LunchGebre Rhylynn Perdomo, MD Phone: [phone]  Fax: [phone]   04/08/2016, 12:13 PM

## 2016-04-08 NOTE — Patient Instructions (Signed)

## 2016-06-14 ENCOUNTER — Other Ambulatory Visit: Payer: Self-pay | Admitting: "Endocrinology

## 2016-06-21 ENCOUNTER — Other Ambulatory Visit: Payer: Self-pay | Admitting: "Endocrinology

## 2016-06-29 ENCOUNTER — Other Ambulatory Visit: Payer: Self-pay | Admitting: "Endocrinology

## 2016-06-29 LAB — COMPREHENSIVE METABOLIC PANEL
ALK PHOS: 82 U/L (ref 40–115)
ALT: 40 U/L (ref 9–46)
AST: 22 U/L (ref 10–40)
Albumin: 4 g/dL (ref 3.6–5.1)
BUN: 11 mg/dL (ref 7–25)
CALCIUM: 8.8 mg/dL (ref 8.6–10.3)
CO2: 28 mmol/L (ref 20–31)
CREATININE: 0.71 mg/dL (ref 0.60–1.35)
Chloride: 106 mmol/L (ref 98–110)
GLUCOSE: 165 mg/dL — AB (ref 65–99)
Potassium: 4.3 mmol/L (ref 3.5–5.3)
SODIUM: 140 mmol/L (ref 135–146)
Total Bilirubin: 0.7 mg/dL (ref 0.2–1.2)
Total Protein: 6.5 g/dL (ref 6.1–8.1)

## 2016-06-29 LAB — T4, FREE: Free T4: 1.2 ng/dL (ref 0.8–1.8)

## 2016-06-29 LAB — TSH: TSH: 2.26 mIU/L (ref 0.40–4.50)

## 2016-06-30 LAB — HEMOGLOBIN A1C
Hgb A1c MFr Bld: 9.2 % — ABNORMAL HIGH (ref <5.7)
MEAN PLASMA GLUCOSE: 217 mg/dL

## 2016-07-08 ENCOUNTER — Encounter: Payer: Self-pay | Admitting: "Endocrinology

## 2016-07-08 ENCOUNTER — Ambulatory Visit (INDEPENDENT_AMBULATORY_CARE_PROVIDER_SITE_OTHER): Payer: Medicaid Other | Admitting: "Endocrinology

## 2016-07-08 VITALS — BP 141/84 | HR 93 | Ht 71.0 in | Wt 241.0 lb

## 2016-07-08 DIAGNOSIS — E782 Mixed hyperlipidemia: Secondary | ICD-10-CM | POA: Diagnosis not present

## 2016-07-08 DIAGNOSIS — I10 Essential (primary) hypertension: Secondary | ICD-10-CM

## 2016-07-08 DIAGNOSIS — Z9119 Patient's noncompliance with other medical treatment and regimen: Secondary | ICD-10-CM | POA: Diagnosis not present

## 2016-07-08 DIAGNOSIS — Z91199 Patient's noncompliance with other medical treatment and regimen due to unspecified reason: Secondary | ICD-10-CM

## 2016-07-08 DIAGNOSIS — E1159 Type 2 diabetes mellitus with other circulatory complications: Secondary | ICD-10-CM

## 2016-07-08 DIAGNOSIS — E039 Hypothyroidism, unspecified: Secondary | ICD-10-CM

## 2016-07-08 MED ORDER — INSULIN NPH ISOPHANE & REGULAR (70-30) 100 UNIT/ML ~~LOC~~ SUSP: SUBCUTANEOUS | 2 refills | Status: DC

## 2016-07-08 NOTE — Patient Instructions (Signed)

## 2016-07-08 NOTE — Progress Notes (Signed)
Subjective:    Patient ID: Nathan [name], male    DOB: 1975-03-05,  .Samuella Bruin   Past Medical History:  Diagnosis Date  . Cardiac arrest (HCC)   . Chest pain   . Diabetes mellitus   . HLD (hyperlipidemia)   . Hypertension   . Obesity   . Pneumonia   . Syncope   . Thyroid disease   . TMJ (dislocation of temporomandibular joint)    Past Surgical History:  Procedure Laterality Date  . CARDIAC CATHETERIZATION     Social History   Social History  . Marital status: Married    Spouse name: N/A  . Number of children: N/A  . Years of education: N/A   Social History Main Topics  . Smoking status: Former Smoker    Years: 6.00    Types: Cigarettes    Quit date: 01/25/2008  . Smokeless tobacco: Never Used  . Alcohol use 2.4 oz/week    4 Cans of beer per week     Comment: 3-4 beers weekly  . Drug use: No  . Sexual activity: Not Asked   Other Topics Concern  . None   Social History Narrative  . None   Outpatient Encounter Prescriptions as of 07/08/2016  Medication Sig  . amLODipine (NORVASC) 2.5 MG tablet Take 1 tablet (2.5 mg total) by mouth daily.  Marland Kitchen aspirin 81 MG tablet Take 81 mg by mouth daily.  Marland Kitchen atorvastatin (LIPITOR) 40 MG tablet Take 1 tablet (40 mg total) by mouth daily.  . insulin NPH-regular Human (NOVOLIN 70/30) (70-30) 100 UNIT/ML injection INJECT 50 UNITS TWICE DAILY WITH BREAKFAST AND SUPPER  . INVOKANA 100 MG TABS tablet TAKE ONE TABLET BY MOUTH IN THE MORNING BEFORE BREAKFAST  . levothyroxine (SYNTHROID, LEVOTHROID) 75 MCG tablet Take 1 tablet (75 mcg total) by mouth daily before breakfast.  . metFORMIN (GLUCOPHAGE) 1000 MG tablet TAKE 1 TABLET TWICE DAILY  . ULTICARE INSULIN SYRINGE 29G X 1/2" 0.5 ML MISC USE TWICE A DAY WITH INSULIN  . [DISCONTINUED] NOVOLIN 70/30 (70-30) 100 UNIT/ML injection INJECT 40 UNITS TWICE DAILY   No facility-administered encounter medications on file as of 07/08/2016.    ALLERGIES: Allergies  Allergen  Reactions  . Benzocaine Anaphylaxis and Other (See Comments)    REACTION: Blistering all over and throat swells shut  . Orange Juice [Orange Oil] Anaphylaxis  . Grapefruit Concentrate    VACCINATION STATUS:  There is no immunization history on file for this patient.  Diabetes  He presents for his follow-up diabetic visit. He has type 2 diabetes mellitus. Onset time: He was diagnosed with type 2 diabetes at approximate age of 33 years. His disease course has been worsening. There are no hypoglycemic associated symptoms. Pertinent negatives for hypoglycemia include no confusion, headaches, pallor or seizures. There are no diabetic associated symptoms. Pertinent negatives for diabetes include no chest pain, no fatigue, no polydipsia, no polyphagia, no polyuria and no weakness. There are no hypoglycemic complications. Symptoms are worsening. Diabetic complications include heart disease. Risk factors for coronary artery disease include dyslipidemia, family history, diabetes mellitus, obesity, male sex and sedentary lifestyle. Current diabetic treatment includes oral agent (dual therapy) (He was supposed to pick up the prescription for Novolin 70/30 to take along with his metformin, invokana, and tanzeum ; however he  he did not take His insulin.). He is compliant with treatment most of the time. His weight is decreasing steadily. He is following a generally unhealthy diet. He has  had a previous visit with a dietitian. He participates in exercise intermittently. Blood glucose monitoring compliance is poor. His overall blood glucose range is >200 mg/dl. (He has monitored  1-2 time a day  , average blood glucose more  than 200 mg/dL. - He did not document his insulin administration.) An ACE inhibitor/angiotensin II receptor blocker is not being taken. Eye exam is current.  Hyperglycemia  This is a chronic problem. The current episode started more than 1 year ago. Pertinent negatives include no abdominal pain,  chest pain, coughing, fatigue, headaches, myalgias, nausea, neck pain, numbness, rash, vomiting or weakness. Treatments tried: He was supposed to be on Lovaza, Lipitor. However he is not on Lovaza for unclear reasons.  Hypertension  This is a chronic problem. The current episode started more than 1 year ago. The problem is uncontrolled. Pertinent negatives include no chest pain, headaches, neck pain, palpitations or shortness of breath. Past treatments include nothing. Identifiable causes of hypertension include a thyroid problem.  Hyperlipidemia  This is a chronic problem. The current episode started more than 1 year ago. The problem is uncontrolled. Recent lipid tests were reviewed and are high. Exacerbating diseases include diabetes, hypothyroidism and obesity. Exacerbated by: Medical noncompliance. Pertinent negatives include no chest pain, myalgias or shortness of breath. Treatments tried: Was supposed to be on atorvastatin and Niaspan, liver patient did not take these medications for the last 2 months. Compliance problems include adherence to diet and medication cost.  Risk factors for coronary artery disease include dyslipidemia, family history, diabetes mellitus, male sex, obesity and a sedentary lifestyle.  Thyroid Problem  Presents for follow-up visit. Patient reports no constipation, diarrhea, fatigue or palpitations. The symptoms have been stable. Past treatments include levothyroxine. His past medical history is significant for diabetes and hyperlipidemia.    Review of Systems  Constitutional: Positive for unexpected weight change. Negative for fatigue.  HENT: Negative for dental problem, mouth sores and trouble swallowing.   Eyes: Negative for visual disturbance.  Respiratory: Negative for cough, choking, chest tightness, shortness of breath and wheezing.   Cardiovascular: Negative for chest pain, palpitations and leg swelling.  Gastrointestinal: Negative for abdominal distention,  abdominal pain, constipation, diarrhea, nausea and vomiting.  Endocrine: Negative for polydipsia, polyphagia and polyuria.  Genitourinary: Negative for dysuria, flank pain, hematuria and urgency.  Musculoskeletal: Negative for back pain, gait problem, myalgias and neck pain.  Skin: Negative for pallor, rash and wound.  Neurological: Negative for seizures, syncope, weakness, numbness and headaches.  Psychiatric/Behavioral: Negative.  Negative for confusion and dysphoric mood.    Objective:    BP (!) 141/84   Pulse 93   Ht 5\' 11"  (1.803 m)   Wt 241 lb (109.3 kg)   BMI 33.61 kg/m   Wt Readings from Last 3 Encounters:  07/08/16 241 lb (109.3 kg)  04/08/16 240 lb (108.9 kg)  12/30/15 231 lb (104.8 kg)    Physical Exam  Constitutional: He is oriented to person, place, and time. He appears well-developed and well-nourished. He is cooperative. No distress.  HENT:  Head: Normocephalic and atraumatic.  Eyes: EOM are normal.  Neck: Normal range of motion. Neck supple. No tracheal deviation present. No thyromegaly present.  Cardiovascular: Normal rate, S1 normal, S2 normal and normal heart sounds.  Exam reveals no gallop.   No murmur heard. Pulses:      Dorsalis pedis pulses are 1+ on the right side, and 1+ on the left side.       Posterior tibial pulses  are 1+ on the right side, and 1+ on the left side.  Pulmonary/Chest: Breath sounds normal. No respiratory distress. He has no wheezes.  Abdominal: Soft. Bowel sounds are normal. He exhibits no distension. There is no tenderness. There is no guarding and no CVA tenderness.  Musculoskeletal: He exhibits no edema.       Right shoulder: He exhibits no swelling and no deformity.  Neurological: He is alert and oriented to person, place, and time. He has normal strength and normal reflexes. No cranial nerve deficit or sensory deficit. Gait normal.  Skin: Skin is warm and dry. No rash noted. No cyanosis. Nails show no clubbing.  Psychiatric: He  has a normal mood and affect. His speech is normal and behavior is normal. Judgment and thought content normal. Cognition and memory are normal.    Chemistry (most recent): Lab Results  Component Value Date   NA 140 06/29/2016   K 4.3 06/29/2016   CL 106 06/29/2016   CO2 28 06/29/2016   BUN 11 06/29/2016   CREATININE 0.71 06/29/2016   Diabetic Labs (most recent): Lab Results  Component Value Date   HGBA1C 9.2 (H) 06/29/2016   HGBA1C 8.6 (H) 04/02/2016   HGBA1C 8.5 (H) 12/22/2015   Lipid Panel     Component Value Date/Time   CHOL 235 (H) 12/22/2015 1040   TRIG 574 (HH) 12/22/2015 1040   HDL 37 (L) 12/22/2015 1040   CHOLHDL 7.3 07/01/2010 0507   VLDL UNABLE TO CALCULATE IF TRIGLYCERIDE OVER 400 mg/dL 16/10/9602 5409   LDLCALC Comment 12/22/2015 1040     Assessment & Plan:   1. Uncontrolled type 2 diabetes mellitus with hyperosmolarity without coma, without long-term current use of insulin (HCC)  His diabetes is  complicated by severe hypertriglyceridemia and nonadherence to medication. Patient came with A1c  Higher at 9 point percent from 8.6%. - He did not document his insulin administration. -  Recent labs reviewed. - Patient remains at a high risk for more acute and chronic complications of diabetes which include CAD, CVA, CKD, retinopathy, and neuropathy. These are all discussed in detail with the patient.  - I have re-counseled the patient on diet management and weight loss  by adopting a carbohydrate restricted / protein rich  Diet. - Patient is advised to stick to a routine mealtimes to eat 3 meals  a day and avoid unnecessary snacks ( to snack only to correct hypoglycemia).  - Suggestion is made for patient to avoid simple carbohydrates   from his diet including Cakes , Desserts, Ice Cream,  Soda (  diet and regular) , Sweet Tea , Candies,  Chips, Cookies, Artificial Sweeteners,   and "Sugar-free" Products .  This will help patient to have stable blood glucose  profile and potentially avoid unintended  Weight gain.  - The patient  will be  scheduled with Norm Salt, RDN, CDE for individualized DM education. - I have approached patient with the following individualized plan to manage diabetes and patient agrees.   - He would need basal/bolus insulin, however he could not afford the insulin analogs. - He didn't commit properly for monitoring of blood glucose for optimal use of insulin.  - Testing only one time a day would not allow him to use even premixed Novolin 70/30. - I urged him to  Commit  and start monitoring blood glucose before breakfast and before supper, resume   premixed insulin Novolin 70/30  50 units with breakfast and 50 units with supper for  pre-meal glucose of greater than 90 mg/DL.  - Patient is warned not to take insulin without proper monitoring per orders. -Adjustment parameters are given for hypo and hyperglycemia in writing. -Patient is encouraged to call clinic for blood glucose levels less than 70 or above 300 mg /dl. - I discontinued  tanzeum   due to high risk of pancreatitis from severe hypertriglyceridemia at 1660 . - I will continue metformin 1000 mg by mouth twice a day, and Invokana  100 mg by mouth every morning, therapeutically suitable for patient.  - Patient specific target  for A1c; LDL, HDL, Triglycerides, and  Waist Circumference were discussed in detail.  2) BP/HTN:  I have added lisinopril 20 mg by mouth every morning. He is counseled on the need to control hypertension to prevent complications.   3) Lipids/HPL: Recent lipid panel is reviewed with him showing triglycerides improving to 574 from 1660 mg per DL. I urged him to resume atorvastatin 40 mg by mouth daily at bedtime ,   and Niaspan 750 mg extended release once a day. unfortunately Lovaza is not covered by his insurance .  4)  obesity:  Agrees to CDE consult, exercise, and carbohydrates information provided.  5) hypothyroidism: He is advised to  continue levothyroxine 75 g by mouth every morning.  - We discussed about correct intake of levothyroxine, at fasting, with water, separated by at least 30 minutes from breakfast, and separated by more than 4 hours from calcium, iron, multivitamins, acid reflux medications (PPIs). -Patient is made aware of the fact that thyroid hormone replacement is needed for life, dose to be adjusted by periodic monitoring of thyroid function tests.  6) Chronic Care/Health Maintenance:  -Patient is on  Statin medications and encouraged to continue to follow up with Ophthalmology, Podiatrist at least yearly or according to recommendations, and advised to stay away from smoking. I have recommended yearly flu vaccine and pneumonia vaccination at least every 5 years; moderate intensity exercise for up to 150 minutes weekly; and  sleep for at least 7 hours a day.   - 30 minutes of time was spent on the care of this patient , 50% of which was applied for counseling on diabetes complications and their preventions.  I advised patient to maintain close follow up with their PCP for primary care needs.  Patient is asked to bring meter and  blood glucose logs during his next visit.   Follow up plan: Return in about 3 months (around 10/08/2016) for meter, and logs.  Marquis LunchGebre Janssen Zee, MD Phone: [phone]  Fax: [phone]   07/08/2016, 11:21 AM

## 2016-09-13 ENCOUNTER — Other Ambulatory Visit: Payer: Self-pay | Admitting: "Endocrinology

## 2016-09-13 MED ORDER — CANAGLIFLOZIN 100 MG PO TABS: ORAL_TABLET | ORAL | 0 refills | Status: DC

## 2016-09-13 MED ORDER — INSULIN NPH ISOPHANE & REGULAR (70-30) 100 UNIT/ML ~~LOC~~ SUSP: SUBCUTANEOUS | 0 refills | Status: DC

## 2016-10-14 ENCOUNTER — Ambulatory Visit: Payer: Medicaid Other | Admitting: "Endocrinology

## 2016-10-31 ENCOUNTER — Ambulatory Visit: Payer: Self-pay | Admitting: "Endocrinology

## 2016-11-04 ENCOUNTER — Emergency Department (HOSPITAL_COMMUNITY): Payer: Self-pay

## 2016-11-04 ENCOUNTER — Emergency Department (HOSPITAL_COMMUNITY)
Admission: EM | Admit: 2016-11-04 | Discharge: 2016-11-04 | Disposition: A | Discharge status: Home / Self Care | Payer: Self-pay | Attending: Emergency Medicine | Admitting: Emergency Medicine

## 2016-11-04 ENCOUNTER — Encounter (HOSPITAL_COMMUNITY): Payer: Self-pay | Admitting: Emergency Medicine

## 2016-11-04 DIAGNOSIS — Z7984 Long term (current) use of oral hypoglycemic drugs: Secondary | ICD-10-CM | POA: Insufficient documentation

## 2016-11-04 DIAGNOSIS — Z87891 Personal history of nicotine dependence: Secondary | ICD-10-CM | POA: Insufficient documentation

## 2016-11-04 DIAGNOSIS — I10 Essential (primary) hypertension: Secondary | ICD-10-CM | POA: Insufficient documentation

## 2016-11-04 DIAGNOSIS — E119 Type 2 diabetes mellitus without complications: Secondary | ICD-10-CM | POA: Insufficient documentation

## 2016-11-04 DIAGNOSIS — Z7982 Long term (current) use of aspirin: Secondary | ICD-10-CM | POA: Insufficient documentation

## 2016-11-04 DIAGNOSIS — K2100 Gastro-esophageal reflux disease with esophagitis, without bleeding: Secondary | ICD-10-CM

## 2016-11-04 DIAGNOSIS — E785 Hyperlipidemia, unspecified: Secondary | ICD-10-CM | POA: Insufficient documentation

## 2016-11-04 DIAGNOSIS — R739 Hyperglycemia, unspecified: Secondary | ICD-10-CM

## 2016-11-04 DIAGNOSIS — Z79899 Other long term (current) drug therapy: Secondary | ICD-10-CM | POA: Insufficient documentation

## 2016-11-04 LAB — BASIC METABOLIC PANEL
ANION GAP: 11 (ref 5–15)
BUN: 6 mg/dL (ref 6–20)
CO2: 27 mmol/L (ref 22–32)
Calcium: 9.6 mg/dL (ref 8.9–10.3)
Chloride: 99 mmol/L — ABNORMAL LOW (ref 101–111)
Creatinine, Ser: 0.62 mg/dL (ref 0.61–1.24)
GLUCOSE: 294 mg/dL — AB (ref 65–99)
Potassium: 3.4 mmol/L — ABNORMAL LOW (ref 3.5–5.1)
Sodium: 137 mmol/L (ref 135–145)

## 2016-11-04 LAB — CBC WITH DIFFERENTIAL/PLATELET
BASOS ABS: 0 10*3/uL (ref 0.0–0.1)
BASOS PCT: 0 %
Eosinophils Absolute: 0.3 10*3/uL (ref 0.0–0.7)
Eosinophils Relative: 3 %
HEMATOCRIT: 46.6 % (ref 39.0–52.0)
Hemoglobin: 16.4 g/dL (ref 13.0–17.0)
Lymphocytes Relative: 21 %
Lymphs Abs: 2.5 10*3/uL (ref 0.7–4.0)
MCH: 31 pg (ref 26.0–34.0)
MCHC: 35.2 g/dL (ref 30.0–36.0)
MCV: 88.1 fL (ref 78.0–100.0)
MONOS PCT: 6 %
Monocytes Absolute: 0.8 10*3/uL (ref 0.1–1.0)
NEUTROS ABS: 8.7 10*3/uL — AB (ref 1.7–7.7)
NEUTROS PCT: 70 %
Platelets: 244 10*3/uL (ref 150–400)
RBC: 5.29 MIL/uL (ref 4.22–5.81)
RDW: 12.7 % (ref 11.5–15.5)
WBC: 12.4 10*3/uL — ABNORMAL HIGH (ref 4.0–10.5)

## 2016-11-04 LAB — URINALYSIS, ROUTINE W REFLEX MICROSCOPIC
BILIRUBIN URINE: NEGATIVE
Bacteria, UA: NONE SEEN
HGB URINE DIPSTICK: NEGATIVE
KETONES UR: 5 mg/dL — AB
LEUKOCYTES UA: NEGATIVE
NITRITE: NEGATIVE
PH: 6 (ref 5.0–8.0)
Protein, ur: 30 mg/dL — AB
SPECIFIC GRAVITY, URINE: 1.031 — AB (ref 1.005–1.030)
SQUAMOUS EPITHELIAL / LPF: NONE SEEN

## 2016-11-04 LAB — BRAIN NATRIURETIC PEPTIDE: B NATRIURETIC PEPTIDE 5: 20 pg/mL (ref 0.0–100.0)

## 2016-11-04 LAB — HEPATIC FUNCTION PANEL
ALBUMIN: 3.9 g/dL (ref 3.5–5.0)
ALT: 36 U/L (ref 17–63)
AST: 19 U/L (ref 15–41)
Alkaline Phosphatase: 99 U/L (ref 38–126)
BILIRUBIN DIRECT: 0.1 mg/dL (ref 0.1–0.5)
BILIRUBIN TOTAL: 1 mg/dL (ref 0.3–1.2)
Indirect Bilirubin: 0.9 mg/dL (ref 0.3–0.9)
Total Protein: 7.2 g/dL (ref 6.5–8.1)

## 2016-11-04 LAB — TROPONIN I

## 2016-11-04 LAB — GLUCOSE, CAPILLARY: GLUCOSE-CAPILLARY: 282 mg/dL — AB (ref 65–99)

## 2016-11-04 LAB — TSH: TSH: 2.821 u[IU]/mL (ref 0.350–4.500)

## 2016-11-04 LAB — LIPASE, BLOOD: Lipase: 22 U/L (ref 11–51)

## 2016-11-04 LAB — CBG MONITORING, ED: Glucose-Capillary: 358 mg/dL — ABNORMAL HIGH (ref 65–99)

## 2016-11-04 MED ORDER — INSULIN ASPART 100 UNIT/ML IV SOLN: 10.0000 [IU] | Freq: Once | INTRAVENOUS | Status: DC

## 2016-11-04 MED ORDER — GI COCKTAIL ~~LOC~~
30.0000 mL | Freq: Once | ORAL | Status: AC
  Administered 2016-11-04: 30 mL via ORAL
  Filled 2016-11-04: qty 30

## 2016-11-04 MED ORDER — ONDANSETRON HCL 4 MG PO TABS: 4.0000 mg | ORAL_TABLET | Freq: Three times a day (TID) | ORAL | 0 refills | Status: DC | PRN

## 2016-11-04 MED ORDER — SODIUM CHLORIDE 0.9 % IV BOLUS (SEPSIS)
1000.0000 mL | Freq: Once | INTRAVENOUS | Status: AC
  Administered 2016-11-04: 1000 mL via INTRAVENOUS

## 2016-11-04 MED ORDER — PANTOPRAZOLE SODIUM 40 MG PO TBEC: 40.0000 mg | DELAYED_RELEASE_TABLET | Freq: Every day | ORAL | 0 refills | Status: DC

## 2016-11-04 NOTE — ED Triage Notes (Signed)
Pt c/o "fluid coming up"-states he is unsure if he is coughing it up or spitting it up. Pt reports he feels like he has something stuck in his throat, but also c/o generalized body aches and elevated bp and glucose levels.

## 2016-11-04 NOTE — ED Provider Notes (Addendum)
[name] emergency department Provider Note   CSN: [id number] Arrival date & time: 11/04/16  1721     History   Chief Complaint Chief Complaint  Patient presents with  . Swallowed Foreign Body    HPI Nathan [name] is a 41 y.o. male.  HPI Patient with history of uncontrolled diabetes and hyperlipidemia presents with gastroesophageal reflux after eating some fish 5 days ago. He states he's had generalized swelling. He also admits to throwing up clear fluid. Normal bowel movements. Difficulty controlling his blood glucose and blood pressure. No fever or chills. Past Medical History:  Diagnosis Date  . Cardiac arrest (HCC)   . Chest pain   . Diabetes mellitus   . HLD (hyperlipidemia)   . Hypertension   . Obesity   . Pneumonia   . Syncope   . Thyroid disease   . TMJ (dislocation of temporomandibular joint)     Patient Active Problem List   Diagnosis Date Noted  . Personal history of noncompliance with medical treatment, presenting hazards to health 04/08/2016  . Normal coronary arteries 09/04/2015  . Type 2 diabetes mellitus with vascular disease (HCC) 12/12/2014  . Primary hypothyroidism 11/03/2014  . Hyperlipidemia 11/03/2014  . Chest pain   . Hypertension   . HLD (hyperlipidemia)     Past Surgical History:  Procedure Laterality Date  . CARDIAC CATHETERIZATION         Home Medications    Prior to Admission medications   Medication Sig Start Date End Date Taking? Authorizing Provider  aspirin 81 MG tablet Take 81 mg by mouth daily.   Yes [provider]  atorvastatin (LIPITOR) 40 MG tablet Take 1 tablet (40 mg total) by mouth daily. 11/03/14  Yes Nida, Denman George, MD  canagliflozin (INVOKANA) 100 MG TABS tablet TAKE ONE TABLET BY MOUTH IN THE MORNING BEFORE BREAKFAST 09/13/16  Yes Nida, Denman George, MD  insulin NPH-regular Human (NOVOLIN 70/30) (70-30) 100 UNIT/ML injection INJECT 50 UNITS TWICE DAILY WITH BREAKFAST AND SUPPER 09/13/16   Yes Nida, Denman George, MD  levothyroxine (SYNTHROID, LEVOTHROID) 75 MCG tablet Take 1 tablet (75 mcg total) by mouth daily before breakfast. 04/08/16  Yes Nida, Denman George, MD  metFORMIN (GLUCOPHAGE) 1000 MG tablet TAKE 1 TABLET TWICE DAILY 06/15/16  Yes Nida, Denman George, MD  Stann Ore INSULIN SYRINGE 29G X 1/2" 0.5 ML MISC USE TWICE A DAY WITH INSULIN 03/31/16  Yes Nida, Denman George, MD  ondansetron (ZOFRAN) 4 MG tablet Take 1 tablet (4 mg total) by mouth every 8 (eight) hours as needed for nausea or vomiting. 11/04/16   Loren Racer, MD  pantoprazole (PROTONIX) 40 MG tablet Take 1 tablet (40 mg total) by mouth daily. 11/04/16   Loren Racer, MD    Family History Family History  Problem Relation Age of Onset  . Heart failure Mother   . Heart attack Father        x5     Social History Social History  Substance Use Topics  . Smoking status: Former Smoker    Years: 6.00    Types: Cigarettes    Quit date: 01/25/2008  . Smokeless tobacco: Never Used  . Alcohol use 2.4 oz/week    4 Cans of beer per week     Comment: 3-4 beers weekly     Allergies   Benzocaine; Orange juice [orange oil]; and Grapefruit concentrate   Review of Systems Review of Systems  Constitutional: Negative for chills and fever.  HENT: Positive for sore  throat. Negative for congestion, sinus pressure, trouble swallowing and voice change.   Eyes: Negative for visual disturbance.  Respiratory: Negative for cough and shortness of breath.   Cardiovascular: Positive for leg swelling. Negative for chest pain and palpitations.  Gastrointestinal: Positive for nausea and vomiting. Negative for constipation and diarrhea.  Genitourinary: Positive for frequency. Negative for dysuria, flank pain and hematuria.  Musculoskeletal: Negative for back pain, myalgias, neck pain and neck stiffness.  Skin: Negative for rash and wound.  Neurological: Negative for dizziness, weakness, light-headedness, numbness  and headaches.  All other systems reviewed and are negative.    Physical Exam Updated Vital Signs BP (!) 163/118 (BP Location: Left Arm)   Pulse 93   Temp 98.7 F (37.1 C) (Oral)   Resp 14   Ht  (1.803 m)   Wt 108.9 kg (240 lb)   SpO2 95%   BMI 33.47 kg/m   Physical Exam  Constitutional: He is oriented to person, place, and time. He appears well-developed and well-nourished. No distress.  HENT:  Head: Normocephalic and atraumatic.  Mouth/Throat: Oropharynx is clear and moist. No oropharyngeal exudate.  Eyes: Pupils are equal, round, and reactive to light. EOM are normal.  Neck: Normal range of motion. Neck supple.  Cardiovascular: Normal rate and regular rhythm.  Exam reveals no gallop and no friction rub.   No murmur heard. Pulmonary/Chest: Effort normal and breath sounds normal. No stridor. No respiratory distress. He has no wheezes. He has no rales. He exhibits no tenderness.  Abdominal: Soft. Bowel sounds are normal. There is no tenderness. There is no rebound and no guarding.  Musculoskeletal: Normal range of motion. He exhibits no edema or tenderness.  No midline thoracic or lumbar tenderness. No CVA tenderness. No lower extremity swelling, asymmetry or tenderness.  Lymphadenopathy:    He has no cervical adenopathy.  Neurological: He is alert and oriented to person, place, and time.  Moving all extremities without focal deficit. Sensation fully intact.  Skin: Skin is warm and dry. Capillary refill takes less than 2 seconds. No rash noted. No erythema.  Psychiatric: He has a normal mood and affect. His behavior is normal.  Nursing note and vitals reviewed.    ED Treatments / Results  Labs (all labs ordered are listed, but only abnormal results are displayed) Labs Reviewed  CBC WITH DIFFERENTIAL/PLATELET - Abnormal; Notable for the following:       Result Value   WBC 12.4 (*)    Neutro Abs 8.7 (*)    All other components within normal limits  BASIC  METABOLIC PANEL - Abnormal; Notable for the following:    Potassium 3.4 (*)    Chloride 99 (*)    Glucose, Bld 294 (*)    All other components within normal limits  URINALYSIS, ROUTINE W REFLEX MICROSCOPIC - Abnormal; Notable for the following:    Specific Gravity, Urine 1.031 (*)    Glucose, UA >=500 (*)    Ketones, ur 5 (*)    Protein, ur 30 (*)    All other components within normal limits  GLUCOSE, CAPILLARY - Abnormal; Notable for the following:    Glucose-Capillary 282 (*)    All other components within normal limits  CBG MONITORING, ED - Abnormal; Notable for the following:    Glucose-Capillary 358 (*)    All other components within normal limits  HEPATIC FUNCTION PANEL  LIPASE, BLOOD  TROPONIN I  BRAIN NATRIURETIC PEPTIDE  TSH  T4, FREE    EKG  EKG Interpretation None       Radiology Dg Chest 2 View  Result Date: 11/04/2016 CLINICAL DATA:  Foreign body sensation in the throat for 5 days. Hypertension and hyperglycemia. EXAM: CHEST  2 VIEW COMPARISON:  08/23/2015 FINDINGS: The lungs are clear except for mild stable curvilinear scarring in the left base. The pulmonary vasculature is normal. Heart size is normal. Hilar and mediastinal contours are unremarkable. There is no pleural effusion. No radiopaque foreign body in the visible portions of the airway. IMPRESSION: No active cardiopulmonary disease. Electronically Signed   By: Ellery Plunk M.D.   On: 11/04/2016 19:20    Procedures Procedures (including critical care time)  Medications Ordered in ED Medications  sodium chloride 0.9 % bolus 1,000 mL (0 mLs Intravenous Stopped 11/04/16 2343)  gi cocktail (Maalox,Lidocaine,Donnatal) (30 mLs Oral Given 11/04/16 2245)     Initial Impression / Assessment and Plan / ED Course  I have reviewed the triage vital signs and the nursing notes.  Pertinent labs & imaging results that were available during my care of the patient were reviewed by me and considered in my  medical decision making (see chart for details).    Patient with atypical chest pain. Suspect likely reflux. Low suspicion for CAD. Will start on PPI. Return precautions given.   Final Clinical Impressions(s) / ED Diagnoses   Final diagnoses:  Gastroesophageal reflux disease with esophagitis  Hyperglycemia    New Prescriptions Discharge Medication List as of 11/04/2016 11:28 PM    START taking these medications   Details  ondansetron (ZOFRAN) 4 MG tablet Take 1 tablet (4 mg total) by mouth every 8 (eight) hours as needed for nausea or vomiting., Starting Fri 11/04/2016, Print    pantoprazole (PROTONIX) 40 MG tablet Take 1 tablet (40 mg total) by mouth daily., Starting Fri 11/04/2016, Print         Loren Racer, MD 11/06/16 Izola Price    Loren Racer, MD 11/27/16 2030

## 2016-11-04 NOTE — ED Notes (Signed)
MD Yelverton notified of CBG 282, per MD only give fluids.

## 2016-11-05 LAB — T4, FREE: FREE T4: 1.01 ng/dL (ref 0.61–1.12)

## 2016-11-30 ENCOUNTER — Ambulatory Visit: Payer: Self-pay | Admitting: "Endocrinology

## 2016-12-12 ENCOUNTER — Ambulatory Visit: Payer: Self-pay | Admitting: "Endocrinology

## 2017-01-06 ENCOUNTER — Ambulatory Visit: Payer: Self-pay | Admitting: "Endocrinology

## 2017-01-12 ENCOUNTER — Other Ambulatory Visit: Payer: Self-pay | Admitting: "Endocrinology

## 2017-02-10 ENCOUNTER — Ambulatory Visit: Payer: Self-pay | Admitting: "Endocrinology

## 2017-02-15 ENCOUNTER — Other Ambulatory Visit: Payer: Self-pay | Admitting: "Endocrinology

## 2017-02-15 DIAGNOSIS — E1165 Type 2 diabetes mellitus with hyperglycemia: Secondary | ICD-10-CM

## 2017-02-16 LAB — RENAL FUNCTION PANEL
Albumin: 4.4 g/dL (ref 3.6–5.1)
BUN: 13 mg/dL (ref 7–25)
CALCIUM: 10.9 mg/dL — AB (ref 8.6–10.3)
CHLORIDE: 100 mmol/L (ref 98–110)
CO2: 30 mmol/L (ref 20–32)
CREATININE: 0.85 mg/dL (ref 0.60–1.35)
Glucose, Bld: 294 mg/dL — ABNORMAL HIGH (ref 65–139)
POTASSIUM: 5.1 mmol/L (ref 3.5–5.3)
Phosphorus: 3.9 mg/dL (ref 2.5–4.5)
SODIUM: 137 mmol/L (ref 135–146)

## 2017-02-16 LAB — HEMOGLOBIN A1C
EAG (MMOL/L): 14.3 (calc)
HEMOGLOBIN A1C: 10.6 %{Hb} — AB (ref <5.7)
Mean Plasma Glucose: 258 (calc)

## 2017-02-25 ENCOUNTER — Other Ambulatory Visit: Payer: Self-pay | Admitting: "Endocrinology

## 2017-02-28 ENCOUNTER — Encounter: Payer: Self-pay | Admitting: "Endocrinology

## 2017-02-28 ENCOUNTER — Ambulatory Visit (INDEPENDENT_AMBULATORY_CARE_PROVIDER_SITE_OTHER): Payer: Self-pay | Admitting: "Endocrinology

## 2017-02-28 VITALS — BP 162/90 | HR 93 | Ht 71.0 in | Wt 233.0 lb

## 2017-02-28 DIAGNOSIS — Z91199 Patient's noncompliance with other medical treatment and regimen due to unspecified reason: Secondary | ICD-10-CM

## 2017-02-28 DIAGNOSIS — Z9119 Patient's noncompliance with other medical treatment and regimen: Secondary | ICD-10-CM

## 2017-02-28 DIAGNOSIS — I10 Essential (primary) hypertension: Secondary | ICD-10-CM

## 2017-02-28 DIAGNOSIS — E039 Hypothyroidism, unspecified: Secondary | ICD-10-CM

## 2017-02-28 DIAGNOSIS — E1159 Type 2 diabetes mellitus with other circulatory complications: Secondary | ICD-10-CM

## 2017-02-28 DIAGNOSIS — E782 Mixed hyperlipidemia: Secondary | ICD-10-CM

## 2017-02-28 MED ORDER — AMLODIPINE BESYLATE 10 MG PO TABS: 10.0000 mg | ORAL_TABLET | Freq: Every day | ORAL | 3 refills | Status: DC

## 2017-02-28 NOTE — Patient Instructions (Signed)

## 2017-02-28 NOTE — Progress Notes (Signed)
Subjective:    Patient ID: Nathan [name], male    DOB: Oct 29, 1975,  .Elfredia Nevins, MD   Past Medical History:  Diagnosis Date  . Cardiac arrest (HCC)   . Chest pain   . Diabetes mellitus   . HLD (hyperlipidemia)   . Hypertension   . Obesity   . Pneumonia   . Syncope   . Thyroid disease   . TMJ (dislocation of temporomandibular joint)    Past Surgical History:  Procedure Laterality Date  . CARDIAC CATHETERIZATION     Social History   Socioeconomic History  . Marital status: Married    Spouse name: None  . Number of children: None  . Years of education: None  . Highest education level: None  Social Needs  . Financial resource strain: None  . Food insecurity - worry: None  . Food insecurity - inability: None  . Transportation needs - medical: None  . Transportation needs - non-medical: None  Occupational History  . None  Tobacco Use  . Smoking status: Former Smoker    Years: 6.00    Types: Cigarettes    Last attempt to quit: 01/25/2008    Years since quitting: 9.1  . Smokeless tobacco: Never Used  Substance and Sexual Activity  . Alcohol use: Yes    Alcohol/week: 2.4 oz    Types: 4 Cans of beer per week    Comment: 3-4 beers weekly  . Drug use: No  . Sexual activity: None  Other Topics Concern  . None  Social History Narrative  . None   Outpatient Encounter Medications as of 02/28/2017  Medication Sig  . amLODipine (NORVASC) 10 MG tablet Take 1 tablet (10 mg total) by mouth daily.  Marland Kitchen aspirin 81 MG tablet Take 81 mg by mouth daily.  Marland Kitchen atorvastatin (LIPITOR) 40 MG tablet Take 1 tablet (40 mg total) by mouth daily.  . insulin NPH-regular Human (NOVOLIN 70/30) (70-30) 100 UNIT/ML injection INJECT 50 UNITS TWICE DAILY WITH BREAKFAST AND SUPPER  . levothyroxine (SYNTHROID, LEVOTHROID) 75 MCG tablet Take 1 tablet (75 mcg total) by mouth daily before breakfast.  . metFORMIN (GLUCOPHAGE) 1000 MG tablet TAKE 1 TABLET TWICE DAILY  . ondansetron (ZOFRAN) 4 MG  tablet Take 1 tablet (4 mg total) by mouth every 8 (eight) hours as needed for nausea or vomiting.  . pantoprazole (PROTONIX) 40 MG tablet Take 1 tablet (40 mg total) by mouth daily.  Marland Kitchen ULTICARE INSULIN SYRINGE 29G X 1/2" 0.5 ML MISC USE TWICE A DAY WITH INSULIN  . [DISCONTINUED] canagliflozin (INVOKANA) 100 MG TABS tablet TAKE ONE TABLET BY MOUTH IN THE MORNING BEFORE BREAKFAST   No facility-administered encounter medications on file as of 02/28/2017.    ALLERGIES: Allergies  Allergen Reactions  . Benzocaine Anaphylaxis and Other (See Comments)    REACTION: Blistering all over and throat swells shut  . Orange Juice [Orange Oil] Anaphylaxis  . Grapefruit Concentrate    VACCINATION STATUS:  There is no immunization history on file for this patient.  Diabetes  He presents for his follow-up diabetic visit. He has type 2 diabetes mellitus. Onset time: He was diagnosed with type 2 diabetes at approximate age of 33 years. His disease course has been worsening. There are no hypoglycemic associated symptoms. Pertinent negatives for hypoglycemia include no confusion, pallor or seizures. There are no diabetic associated symptoms. Pertinent negatives for diabetes include no polydipsia, no polyphagia and no polyuria. There are no hypoglycemic complications. Symptoms are worsening. Diabetic complications  include heart disease. Risk factors for coronary artery disease include dyslipidemia, family history, diabetes mellitus, obesity, male sex and sedentary lifestyle. Current diabetic treatment includes oral agent (dual therapy) (He was supposed to pick up the prescription for Novolin 70/30 to take along with his metformin, invokana, and tanzeum ; however he  he did not take His insulin.). He is compliant with treatment most of the time. His weight is decreasing steadily. He is following a generally unhealthy diet. He has had a previous visit with a dietitian. He participates in exercise intermittently. There is  no compliance with monitoring of blood glucose. (He is appointment since June 2018.  His A1c is higher at 10.6%.  He did not bring any meter no logs to review today.  ) An ACE inhibitor/angiotensin II receptor blocker is not being taken. Eye exam is current.  Hypertension  This is a chronic problem. The current episode started more than 1 year ago. The problem is uncontrolled. Pertinent negatives include no palpitations or shortness of breath. Risk factors for coronary artery disease include diabetes mellitus, dyslipidemia, sedentary lifestyle and smoking/tobacco exposure. Past treatments include calcium channel blockers. The current treatment provides no improvement. Hypertensive end-organ damage includes CAD/MI. Identifiable causes of hypertension include a thyroid problem.  Hyperlipidemia  This is a chronic problem. The current episode started more than 1 year ago. The problem is uncontrolled. Recent lipid tests were reviewed and are high. Exacerbating diseases include diabetes, hypothyroidism and obesity. Exacerbated by: Medical noncompliance. Pertinent negatives include no shortness of breath. Treatments tried: Was supposed to be on atorvastatin and Niaspan, liver patient did not take these medications for the last 2 months. Compliance problems include adherence to diet and medication cost.  Risk factors for coronary artery disease include dyslipidemia, family history, diabetes mellitus, male sex, obesity and a sedentary lifestyle.  Thyroid Problem  Presents for follow-up visit. Patient reports no constipation, diarrhea or palpitations. The symptoms have been stable. Past treatments include levothyroxine. His past medical history is significant for diabetes and hyperlipidemia.    Review of Systems  Constitutional: Positive for unexpected weight change.  HENT: Negative for dental problem, mouth sores and trouble swallowing.   Eyes: Negative for visual disturbance.  Respiratory: Negative for  choking, chest tightness, shortness of breath and wheezing.   Cardiovascular: Negative for palpitations and leg swelling.  Gastrointestinal: Negative for abdominal distention, constipation and diarrhea.  Endocrine: Negative for polydipsia, polyphagia and polyuria.  Genitourinary: Negative for dysuria, flank pain, hematuria and urgency.  Musculoskeletal: Negative for back pain and gait problem.  Skin: Negative for pallor and wound.  Neurological: Negative for seizures and syncope.  Psychiatric/Behavioral: Negative.  Negative for confusion and dysphoric mood.    Objective:    BP (!) 162/90   Pulse 93   Ht 5\' 11"  (1.803 m)   Wt 233 lb (105.7 kg)   BMI 32.50 kg/m   Wt Readings from Last 3 Encounters:  02/28/17 233 lb (105.7 kg)  11/04/16 240 lb (108.9 kg)  07/08/16 241 lb (109.3 kg)    Physical Exam  Constitutional: He is oriented to person, place, and time. He appears well-developed and well-nourished. He is cooperative. No distress.  HENT:  Head: Normocephalic and atraumatic.  Eyes: EOM are normal.  Neck: Normal range of motion. Neck supple. No tracheal deviation present. No thyromegaly present.  Cardiovascular: Normal rate, S1 normal, S2 normal and normal heart sounds. Exam reveals no gallop.  No murmur heard. Pulses:      Dorsalis pedis pulses  are 1+ on the right side, and 1+ on the left side.       Posterior tibial pulses are 1+ on the right side, and 1+ on the left side.  Pulmonary/Chest: Breath sounds normal. No respiratory distress. He has no wheezes.  Abdominal: Soft. Bowel sounds are normal. He exhibits no distension. There is no tenderness. There is no guarding and no CVA tenderness.  Musculoskeletal: He exhibits no edema.       Right shoulder: He exhibits no swelling and no deformity.  Neurological: He is alert and oriented to person, place, and time. He has normal strength and normal reflexes. No cranial nerve deficit or sensory deficit. Gait normal.  Skin: Skin is  warm and dry. No rash noted. No cyanosis. Nails show no clubbing.  Psychiatric: He has a normal mood and affect. His speech is normal and behavior is normal. Judgment and thought content normal. Cognition and memory are normal.    Chemistry (most recent): Lab Results  Component Value Date   NA 137 02/15/2017   K 5.1 02/15/2017   CL 100 02/15/2017   CO2 30 02/15/2017   BUN 13 02/15/2017   CREATININE 0.85 02/15/2017   Diabetic Labs (most recent): Lab Results  Component Value Date   HGBA1C 10.6 (H) 02/15/2017   HGBA1C 9.2 (H) 06/29/2016   HGBA1C 8.6 (H) 04/02/2016   Lipid Panel     Component Value Date/Time   CHOL 235 (H) 12/22/2015 1040   TRIG 574 (HH) 12/22/2015 1040   HDL 37 (L) 12/22/2015 1040   CHOLHDL 7.3 07/01/2010 0507   VLDL UNABLE TO CALCULATE IF TRIGLYCERIDE OVER 400 mg/dL 16/10/9602 5409   LDLCALC Comment 12/22/2015 1040     Assessment & Plan:   1. Uncontrolled type 2 diabetes mellitus, with long-term current use of insulin (HCC)  His diabetes is  complicated by severe hypertriglyceridemia and nonadherence to medication. Patient missed his appointment since June 2018 and returns with higher A1c of 10.6%.  -He did not bring any meter or logs to review today.  -  Recent labs reviewed. - Patient remains at a high risk for more acute and chronic complications of diabetes which include CAD, CVA, CKD, retinopathy, and neuropathy. These are all discussed in detail with the patient.  - I have re-counseled the patient on diet management and weight loss  by adopting a carbohydrate restricted / protein rich  Diet. - Patient is advised to stick to a routine mealtimes to eat 3 meals  a day and avoid unnecessary snacks ( to snack only to correct hypoglycemia).  -  Suggestion is made for him to avoid simple carbohydrates  from his diet including Cakes, Sweet Desserts / Pastries, Ice Cream, Soda (diet and regular), Sweet Tea, Candies, Chips, Cookies, Store Bought Juices,  Alcohol in Excess of  1-2 drinks a day, Artificial Sweeteners, and "Sugar-free" Products. This will help patient to have stable blood glucose profile and potentially avoid unintended weight gain.   - The patient  will be  scheduled with Norm Salt, RDN, CDE for individualized DM education. - I have approached patient with the following individualized plan to manage diabetes and patient agrees.  -Ideally, he would need basal/bolus insulin, however he could not afford the insulin analogs nor is he committed for proper monitoring and safe use of insulin.  - I urged him to  Commit  and start monitoring blood glucose 4 times a day-before meals and at bedtime and return in 1 week with his meter  and logs. In the meantime I have advised him to resume his premixed insulin Novolin 70/30  50 units with breakfast and 15 units with supper for pre-meal blood glucose readings above 90 mg/dL.   - Patient is warned not to take insulin without proper monitoring per orders. -Adjustment parameters are given for hypo and hyperglycemia in writing. -Patient is encouraged to call clinic for blood glucose levels less than 70 or above 300 mg /dl. - I discontinued  tanzeum   due to high risk of pancreatitis from severe hypertriglyceridemia at 1660 . - I will continue metformin 1000 mg by mouth twice a day, and discontinue Invokana . - Patient specific target  for A1c; LDL, HDL, Triglycerides, and  Waist Circumference were discussed in detail.  2) BP/HTN: His blood pressure is significantly above target, he was not taking his blood pressure medications.  I have discussed in refill his prescriptions including amlodipine 10 mg p.o. daily.  -For unclear reasons, he did not stay on lisinopril prescribed last visit.  3) Lipids/HPL: Recent lipid panel is reviewed with him showing triglycerides improving to 574 from 1660 mg per DL. I urged him to resume atorvastatin 40 mg by mouth daily at bedtime , unfortunately Lovaza is  not covered by his insurance .  He did not stay on Niaspan which was prescribed for him last visit.  4)  obesity:  Agrees to CDE consult, exercise, and carbohydrates information provided.  5) hypothyroidism: He is advised to continue levothyroxine 75 g by mouth every morning.   - We discussed about correct intake of levothyroxine, at fasting, with water, separated by at least 30 minutes from breakfast, and separated by more than 4 hours from calcium, iron, multivitamins, acid reflux medications (PPIs). -Patient is made aware of the fact that thyroid hormone replacement is needed for life, dose to be adjusted by periodic monitoring of thyroid function tests.  6) Chronic Care/Health Maintenance:  -Patient is on  Statin medications and encouraged to continue to follow up with Ophthalmology, Podiatrist at least yearly or according to recommendations, and advised to stay away from smoking. I have recommended yearly flu vaccine and pneumonia vaccination at least every 5 years; moderate intensity exercise for up to 150 minutes weekly; and  sleep for at least 7 hours a day.   - Time spent with the patient: 25 min, of which >50% was spent in reviewing his blood glucose logs , discussing his hypo- and hyper-glycemic episodes, reviewing his current and  previous labs and insulin doses and developing a plan to avoid hypo- and hyper-glycemia. Please refer to Patient Instructions for Blood Glucose Monitoring and Insulin/Medications Dosing Guide"  in media tab for additional information.   I advised patient to maintain close follow up with his PCP for primary care needs.   Follow up plan: Return in about 1 week (around 03/07/2017) for follow up with meter and logs- no labs.  Marquis LunchGebre Cloria Ciresi, MD Phone: [phone]  Fax: [phone]  -  This note was partially dictated with voice recognition software. Similar sounding words can be transcribed inadequately or may not  be corrected upon review.  02/28/2017, 3:16  PM

## 2017-03-07 ENCOUNTER — Ambulatory Visit (INDEPENDENT_AMBULATORY_CARE_PROVIDER_SITE_OTHER): Payer: Self-pay | Admitting: "Endocrinology

## 2017-03-07 ENCOUNTER — Encounter: Payer: Self-pay | Admitting: "Endocrinology

## 2017-03-07 VITALS — BP 136/90 | HR 94 | Ht 71.0 in | Wt 231.0 lb

## 2017-03-07 DIAGNOSIS — I10 Essential (primary) hypertension: Secondary | ICD-10-CM

## 2017-03-07 DIAGNOSIS — E039 Hypothyroidism, unspecified: Secondary | ICD-10-CM

## 2017-03-07 DIAGNOSIS — E782 Mixed hyperlipidemia: Secondary | ICD-10-CM

## 2017-03-07 DIAGNOSIS — E1159 Type 2 diabetes mellitus with other circulatory complications: Secondary | ICD-10-CM

## 2017-03-07 MED ORDER — INSULIN NPH ISOPHANE & REGULAR (70-30) 100 UNIT/ML ~~LOC~~ SUSP: SUBCUTANEOUS | 0 refills | Status: DC

## 2017-03-07 NOTE — Progress Notes (Signed)
Subjective:    Patient ID: Nathan [name], male    DOB: 1975/09/10,  .Elfredia Nevins, MD   Past Medical History:  Diagnosis Date  . Cardiac arrest (HCC)   . Chest pain   . Diabetes mellitus   . HLD (hyperlipidemia)   . Hypertension   . Obesity   . Pneumonia   . Syncope   . Thyroid disease   . TMJ (dislocation of temporomandibular joint)    Past Surgical History:  Procedure Laterality Date  . CARDIAC CATHETERIZATION     Social History   Socioeconomic History  . Marital status: Married    Spouse name: None  . Number of children: None  . Years of education: None  . Highest education level: None  Social Needs  . Financial resource strain: None  . Food insecurity - worry: None  . Food insecurity - inability: None  . Transportation needs - medical: None  . Transportation needs - non-medical: None  Occupational History  . None  Tobacco Use  . Smoking status: Former Smoker    Years: 6.00    Types: Cigarettes    Last attempt to quit: 01/25/2008    Years since quitting: 9.1  . Smokeless tobacco: Never Used  Substance and Sexual Activity  . Alcohol use: Yes    Alcohol/week: 2.4 oz    Types: 4 Cans of beer per week    Comment: 3-4 beers weekly  . Drug use: No  . Sexual activity: None  Other Topics Concern  . None  Social History Narrative  . None   Outpatient Encounter Medications as of 03/07/2017  Medication Sig  . amLODipine (NORVASC) 10 MG tablet Take 1 tablet (10 mg total) by mouth daily.  Marland Kitchen aspirin 81 MG tablet Take 81 mg by mouth daily.  Marland Kitchen atorvastatin (LIPITOR) 40 MG tablet Take 1 tablet (40 mg total) by mouth daily.  . insulin NPH-regular Human (NOVOLIN 70/30) (70-30) 100 UNIT/ML injection INJECT 65 UNITS TWICE DAILY WITH BREAKFAST AND SUPPER  . levothyroxine (SYNTHROID, LEVOTHROID) 75 MCG tablet Take 1 tablet (75 mcg total) by mouth daily before breakfast.  . metFORMIN (GLUCOPHAGE) 1000 MG tablet TAKE 1 TABLET TWICE DAILY  . ondansetron (ZOFRAN) 4  MG tablet Take 1 tablet (4 mg total) by mouth every 8 (eight) hours as needed for nausea or vomiting.  . pantoprazole (PROTONIX) 40 MG tablet Take 1 tablet (40 mg total) by mouth daily.  Marland Kitchen ULTICARE INSULIN SYRINGE 29G X 1/2" 0.5 ML MISC USE TWICE A DAY WITH INSULIN  . [DISCONTINUED] insulin NPH-regular Human (NOVOLIN 70/30) (70-30) 100 UNIT/ML injection INJECT 50 UNITS TWICE DAILY WITH BREAKFAST AND SUPPER   No facility-administered encounter medications on file as of 03/07/2017.    ALLERGIES: Allergies  Allergen Reactions  . Benzocaine Anaphylaxis and Other (See Comments)    REACTION: Blistering all over and throat swells shut  . Orange Juice [Orange Oil] Anaphylaxis  . Grapefruit Concentrate    VACCINATION STATUS:  There is no immunization history on file for this patient.  Diabetes  He presents for his follow-up diabetic visit. He has type 2 diabetes mellitus. Onset time: He was diagnosed with type 2 diabetes at approximate age of 33 years. His disease course has been improving. There are no hypoglycemic associated symptoms. Pertinent negatives for hypoglycemia include no confusion, pallor or seizures. Associated symptoms include polydipsia and polyuria. Pertinent negatives for diabetes include no polyphagia. There are no hypoglycemic complications. Symptoms are improving. Diabetic complications include heart disease.  Risk factors for coronary artery disease include dyslipidemia, family history, diabetes mellitus, obesity, male sex and sedentary lifestyle. Current diabetic treatment includes oral agent (dual therapy) (He was supposed to pick up the prescription for Novolin 70/30 to take along with his metformin, invokana, and tanzeum ; however he  he did not take His insulin.). He is compliant with treatment most of the time. His weight is stable. He is following a generally unhealthy diet. He has had a previous visit with a dietitian. He participates in exercise intermittently. There is no  compliance with monitoring of blood glucose. His breakfast blood glucose range is generally >200 mg/dl. His lunch blood glucose range is generally >200 mg/dl. His dinner blood glucose range is generally >200 mg/dl. His bedtime blood glucose range is generally >200 mg/dl. His overall blood glucose range is >200 mg/dl. (He is appointment since June 2018.  His A1c is higher at 10.6%.  He did not bring any meter no logs to review today.  ) An ACE inhibitor/angiotensin II receptor blocker is not being taken. Eye exam is current.  Hypertension  This is a chronic problem. The current episode started more than 1 year ago. The problem is uncontrolled. Pertinent negatives include no palpitations or shortness of breath. Risk factors for coronary artery disease include diabetes mellitus, dyslipidemia, sedentary lifestyle and smoking/tobacco exposure. Past treatments include calcium channel blockers. The current treatment provides no improvement. Hypertensive end-organ damage includes CAD/MI. Identifiable causes of hypertension include a thyroid problem.  Hyperlipidemia  This is a chronic problem. The current episode started more than 1 year ago. The problem is uncontrolled. Recent lipid tests were reviewed and are high. Exacerbating diseases include diabetes, hypothyroidism and obesity. Exacerbated by: Medical noncompliance. Pertinent negatives include no shortness of breath. Treatments tried: Was supposed to be on atorvastatin and Niaspan, liver patient did not take these medications for the last 2 months. Compliance problems include adherence to diet and medication cost.  Risk factors for coronary artery disease include dyslipidemia, family history, diabetes mellitus, male sex, obesity and a sedentary lifestyle.  Thyroid Problem  Presents for follow-up visit. Patient reports no constipation, diarrhea or palpitations. The symptoms have been stable. Past treatments include levothyroxine. His past medical history is  significant for diabetes and hyperlipidemia.    Review of Systems  Constitutional: Positive for unexpected weight change.  HENT: Negative for dental problem, mouth sores and trouble swallowing.   Eyes: Negative for visual disturbance.  Respiratory: Negative for choking, chest tightness, shortness of breath and wheezing.   Cardiovascular: Negative for palpitations and leg swelling.  Gastrointestinal: Negative for abdominal distention, constipation and diarrhea.  Endocrine: Positive for polydipsia and polyuria. Negative for polyphagia.  Genitourinary: Negative for dysuria, flank pain, hematuria and urgency.  Musculoskeletal: Negative for back pain and gait problem.  Skin: Negative for pallor and wound.  Neurological: Negative for seizures and syncope.  Psychiatric/Behavioral: Negative.  Negative for confusion and dysphoric mood.    Objective:    BP 136/90   Pulse 94   Ht 5\' 11"  (1.803 m)   Wt 231 lb (104.8 kg)   BMI 32.22 kg/m   Wt Readings from Last 3 Encounters:  03/07/17 231 lb (104.8 kg)  02/28/17 233 lb (105.7 kg)  11/04/16 240 lb (108.9 kg)    Physical Exam  Constitutional: He is oriented to person, place, and time. He appears well-developed and well-nourished. He is cooperative. No distress.  HENT:  Head: Normocephalic and atraumatic.  Eyes: EOM are normal.  Neck: Normal  range of motion. Neck supple. No tracheal deviation present. No thyromegaly present.  Cardiovascular: Normal rate, S1 normal, S2 normal and normal heart sounds. Exam reveals no gallop.  No murmur heard. Pulses:      Dorsalis pedis pulses are 1+ on the right side, and 1+ on the left side.       Posterior tibial pulses are 1+ on the right side, and 1+ on the left side.  Pulmonary/Chest: Breath sounds normal. No respiratory distress. He has no wheezes.  Abdominal: Soft. Bowel sounds are normal. He exhibits no distension. There is no tenderness. There is no guarding and no CVA tenderness.   Musculoskeletal: He exhibits no edema.       Right shoulder: He exhibits no swelling and no deformity.  Neurological: He is alert and oriented to person, place, and time. He has normal strength and normal reflexes. No cranial nerve deficit or sensory deficit. Gait normal.  Skin: Skin is warm and dry. No rash noted. No cyanosis. Nails show no clubbing.  Psychiatric: He has a normal mood and affect. His speech is normal and behavior is normal. Judgment and thought content normal. Cognition and memory are normal.    Chemistry (most recent): Lab Results  Component Value Date   NA 137 02/15/2017   K 5.1 02/15/2017   CL 100 02/15/2017   CO2 30 02/15/2017   BUN 13 02/15/2017   CREATININE 0.85 02/15/2017   Diabetic Labs (most recent): Lab Results  Component Value Date   HGBA1C 10.6 (H) 02/15/2017   HGBA1C 9.2 (H) 06/29/2016   HGBA1C 8.6 (H) 04/02/2016   Lipid Panel     Component Value Date/Time   CHOL 235 (H) 12/22/2015 1040   TRIG 574 (HH) 12/22/2015 1040   HDL 37 (L) 12/22/2015 1040   CHOLHDL 7.3 07/01/2010 0507   VLDL UNABLE TO CALCULATE IF TRIGLYCERIDE OVER 400 mg/dL 16/10/9602 5409   LDLCALC Comment 12/22/2015 1040     Assessment & Plan:   1. Uncontrolled type 2 diabetes mellitus, with long-term current use of insulin (HCC)  His diabetes is  complicated by severe hypertriglyceridemia and nonadherence to medication. Patient came with slightly better but still above target glucose profile. - His recent A1c  Was high at 10.6%.  -  Recent labs reviewed. - Patient remains at a high risk for more acute and chronic complications of diabetes which include CAD, CVA, CKD, retinopathy, and neuropathy. These are all discussed in detail with the patient.  - I have re-counseled the patient on diet management and weight loss  by adopting a carbohydrate restricted / protein rich  Diet. - Patient is advised to stick to a routine mealtimes to eat 3 meals  a day and avoid unnecessary  snacks ( to snack only to correct hypoglycemia).  -  Suggestion is made for him to avoid simple carbohydrates  from his diet including Cakes, Sweet Desserts / Pastries, Ice Cream, Soda (diet and regular), Sweet Tea, Candies, Chips, Cookies, Store Bought Juices, Alcohol in Excess of  1-2 drinks a day, Artificial Sweeteners, and "Sugar-free" Products. This will help patient to have stable blood glucose profile and potentially avoid unintended weight gain.  - The patient  will be  scheduled with Norm Salt, RDN, CDE for individualized DM education. - I have approached patient with the following individualized plan to manage diabetes and patient agrees.  -Ideally, he would need basal/bolus insulin, however he could not afford the insulin analogs nor is he committed for proper monitoring and  safe use of insulin.  - I urged him to stay committed to monitor blood glucose at least 2 times a day, daily before breakfast and before supper, and as needed.   In the meantime I have advised him to increase his premixed insulin Novolin 70/30  To 65 units with breakfast and 65 units with supper for pre-meal blood glucose readings above 90 mg/dL.   - Patient is warned not to take insulin without proper monitoring per orders. - Adjustment parameters are given for hypo and hyperglycemia in writing. -Patient is encouraged to call clinic for blood glucose levels less than 70 or above 300 mg /dl. - I discontinued  Tanzeum   due to high risk of pancreatitis, slowly improving from severe hypertriglyceridemia at 1660 . - I will continue metformin 1000 mg by mouth twice a day. - Patient specific target  for A1c; LDL, HDL, Triglycerides, and  Waist Circumference were discussed in detail.  2) BP/HTN: His blood pressure is controlled to target.  He is now more consistent taking his blood pressure medications.   I have advised him to continue amlodipine 10 mg p.o. daily.      3) Lipids/HPL: Recent lipid panel is  reviewed with him showing triglycerides improving to 574 from 1660 mg per DL. I urged him to resume atorvastatin 40 mg by mouth daily at bedtime , unfortunately Lovaza is not covered by his insurance .  He did not stay on Niaspan which was prescribed for him last visit.   4)  obesity:  Agrees to CDE consult, exercise, and carbohydrates information provided.  5) Hypothyroidism: He is advised to continue levothyroxine 75 g by mouth every morning.   - We discussed about correct intake of levothyroxine, at fasting, with water, separated by at least 30 minutes from breakfast, and separated by more than 4 hours from calcium, iron, multivitamins, acid reflux medications (PPIs). -Patient is made aware of the fact that thyroid hormone replacement is needed for life, dose to be adjusted by periodic monitoring of thyroid function tests.  6) Chronic Care/Health Maintenance:  -Patient is on  Statin medications and encouraged to continue to follow up with Ophthalmology, Podiatrist at least yearly or according to recommendations, and advised to stay away from smoking. I have recommended yearly flu vaccine and pneumonia vaccination at least every 5 years; moderate intensity exercise for up to 150 minutes weekly; and  sleep for at least 7 hours a day.   - Time spent with the patient: 25 min, of which >50% was spent in reviewing his blood glucose logs , discussing his hypo- and hyper-glycemic episodes, reviewing his current and  previous labs and insulin doses and developing a plan to avoid hypo- and hyper-glycemia. Please refer to Patient Instructions for Blood Glucose Monitoring and Insulin/Medications Dosing Guide"  in media tab for additional information.  I advised patient to maintain close follow up with his PCP for primary care needs.   Follow up plan: Return in about 3 months (around 06/04/2017) for follow up with pre-visit labs, meter, and logs.  Marquis Lunch, MD Phone: [phone]  Fax: [phone]   -  This note was partially dictated with voice recognition software. Similar sounding words can be transcribed inadequately or may not  be corrected upon review.  03/07/2017, 12:21 PM

## 2017-03-07 NOTE — Patient Instructions (Signed)

## 2017-03-10 ENCOUNTER — Ambulatory Visit: Payer: Self-pay | Admitting: "Endocrinology

## 2017-05-26 ENCOUNTER — Encounter (HOSPITAL_COMMUNITY): Payer: Self-pay | Admitting: Emergency Medicine

## 2017-05-26 ENCOUNTER — Emergency Department (HOSPITAL_COMMUNITY)
Admission: EM | Admit: 2017-05-26 | Discharge: 2017-05-26 | Disposition: A | Discharge status: Home / Self Care | Payer: BLUE CROSS/BLUE SHIELD | Attending: Emergency Medicine | Admitting: Emergency Medicine

## 2017-05-26 ENCOUNTER — Other Ambulatory Visit: Payer: Self-pay

## 2017-05-26 DIAGNOSIS — Z794 Long term (current) use of insulin: Secondary | ICD-10-CM | POA: Insufficient documentation

## 2017-05-26 DIAGNOSIS — Z87891 Personal history of nicotine dependence: Secondary | ICD-10-CM | POA: Insufficient documentation

## 2017-05-26 DIAGNOSIS — R197 Diarrhea, unspecified: Secondary | ICD-10-CM | POA: Diagnosis not present

## 2017-05-26 DIAGNOSIS — A088 Other specified intestinal infections: Secondary | ICD-10-CM | POA: Diagnosis not present

## 2017-05-26 DIAGNOSIS — Z79899 Other long term (current) drug therapy: Secondary | ICD-10-CM | POA: Diagnosis not present

## 2017-05-26 DIAGNOSIS — E1165 Type 2 diabetes mellitus with hyperglycemia: Secondary | ICD-10-CM | POA: Diagnosis not present

## 2017-05-26 DIAGNOSIS — A09 Infectious gastroenteritis and colitis, unspecified: Secondary | ICD-10-CM | POA: Diagnosis not present

## 2017-05-26 DIAGNOSIS — R739 Hyperglycemia, unspecified: Secondary | ICD-10-CM

## 2017-05-26 DIAGNOSIS — I10 Essential (primary) hypertension: Secondary | ICD-10-CM | POA: Diagnosis not present

## 2017-05-26 LAB — URINALYSIS, ROUTINE W REFLEX MICROSCOPIC
BILIRUBIN URINE: NEGATIVE
Glucose, UA: NEGATIVE mg/dL
KETONES UR: NEGATIVE mg/dL
LEUKOCYTES UA: NEGATIVE
Nitrite: NEGATIVE
PROTEIN: 30 mg/dL — AB
Specific Gravity, Urine: 1.024 (ref 1.005–1.030)
pH: 5 (ref 5.0–8.0)

## 2017-05-26 LAB — CBG MONITORING, ED: Glucose-Capillary: 131 mg/dL — ABNORMAL HIGH (ref 65–99)

## 2017-05-26 LAB — CBC WITH DIFFERENTIAL/PLATELET
BASOS PCT: 0 %
Basophils Absolute: 0 10*3/uL (ref 0.0–0.1)
EOS ABS: 0.2 10*3/uL (ref 0.0–0.7)
EOS PCT: 3 %
HCT: 43.8 % (ref 39.0–52.0)
HEMOGLOBIN: 15 g/dL (ref 13.0–17.0)
Lymphocytes Relative: 18 %
Lymphs Abs: 1.2 10*3/uL (ref 0.7–4.0)
MCH: 29.5 pg (ref 26.0–34.0)
MCHC: 34.2 g/dL (ref 30.0–36.0)
MCV: 86.2 fL (ref 78.0–100.0)
Monocytes Absolute: 0.8 10*3/uL (ref 0.1–1.0)
Monocytes Relative: 11 %
NEUTROS PCT: 68 %
Neutro Abs: 4.5 10*3/uL (ref 1.7–7.7)
PLATELETS: 208 10*3/uL (ref 150–400)
RBC: 5.08 MIL/uL (ref 4.22–5.81)
RDW: 12.2 % (ref 11.5–15.5)
WBC: 6.7 10*3/uL (ref 4.0–10.5)

## 2017-05-26 LAB — COMPREHENSIVE METABOLIC PANEL
ALBUMIN: 3.6 g/dL (ref 3.5–5.0)
ALT: 30 U/L (ref 17–63)
AST: 20 U/L (ref 15–41)
Alkaline Phosphatase: 102 U/L (ref 38–126)
Anion gap: 13 (ref 5–15)
BILIRUBIN TOTAL: 0.9 mg/dL (ref 0.3–1.2)
BUN: 9 mg/dL (ref 6–20)
CO2: 26 mmol/L (ref 22–32)
Calcium: 9.3 mg/dL (ref 8.9–10.3)
Chloride: 97 mmol/L — ABNORMAL LOW (ref 101–111)
Creatinine, Ser: 0.66 mg/dL (ref 0.61–1.24)
GFR calc Af Amer: >60 mL/min (ref >60)
GFR calc non Af Amer: >60 mL/min (ref >60)
GLUCOSE: 361 mg/dL — AB (ref 65–99)
POTASSIUM: 3.6 mmol/L (ref 3.5–5.1)
SODIUM: 136 mmol/L (ref 135–145)
TOTAL PROTEIN: 6.7 g/dL (ref 6.5–8.1)

## 2017-05-26 LAB — LIPASE, BLOOD: Lipase: 46 U/L (ref 11–51)

## 2017-05-26 MED ORDER — ONDANSETRON HCL 4 MG PO TABS: 4.0000 mg | ORAL_TABLET | Freq: Four times a day (QID) | ORAL | 0 refills | Status: DC | PRN

## 2017-05-26 MED ORDER — INSULIN ASPART 100 UNIT/ML ~~LOC~~ SOLN
SUBCUTANEOUS | Status: AC
  Filled 2017-05-26: qty 1

## 2017-05-26 MED ORDER — INSULIN ASPART 100 UNIT/ML IV SOLN
10.0000 [IU] | Freq: Once | INTRAVENOUS | Status: AC
  Administered 2017-05-26: 10 [IU] via INTRAVENOUS

## 2017-05-26 MED ORDER — SODIUM CHLORIDE 0.9 % IV BOLUS
1000.0000 mL | Freq: Once | INTRAVENOUS | Status: AC
  Administered 2017-05-26: 1000 mL via INTRAVENOUS

## 2017-05-26 NOTE — ED Notes (Signed)
Out of bed to BR 

## 2017-05-26 NOTE — Discharge Instructions (Addendum)
Drink plenty of fluids and follow-up with your family doctor next week to have urine rechecked

## 2017-05-26 NOTE — ED Triage Notes (Signed)
dirrhea x 3 days. gen achyness this am. Mm moist. Pt states he feels dehydrated.

## 2017-05-26 NOTE — ED Notes (Signed)
Pt with cardiac arrest oon admission of new onset diabetes 4 yrs ago  Last A1C 10 per his report Continues to smoke, had biscuits and gravy for breakfast   Education: GI problems from non compliance, heart dx from smoking and high A1C Dietary restraint when eating carbohydrates

## 2017-05-26 NOTE — ED Provider Notes (Signed)
[facility] EMERGENCY DEPARTMENT Provider Note   CSN: [id number] Arrival date & time: 05/26/17  1638     History   Chief Complaint Chief Complaint  Patient presents with  . Diarrhea    HPI Nathan [name] is a 42 y.o. male.  HPI Patient presents with 3 days of diarrhea.  States he has had multiple watery stools.  Denies blood in the stool.  Denies fever or chills.  Mild nausea today but no vomiting.  Denies abdominal pain.  States he had a coworker with similar symptoms.  Has had some lightheadedness and generalized fatigue today.  No recent hospitalizations or antibiotics. Past Medical History:  Diagnosis Date  . Cardiac arrest (HCC)   . Chest pain   . Diabetes mellitus   . HLD (hyperlipidemia)   . Hypertension   . Obesity   . Pneumonia   . Syncope   . Thyroid disease   . TMJ (dislocation of temporomandibular joint)     Patient Active Problem List   Diagnosis Date Noted  . Personal history of noncompliance with medical treatment, presenting hazards to health 04/08/2016  . Normal coronary arteries 09/04/2015  . Type 2 diabetes mellitus with vascular disease (HCC) 12/12/2014  . Primary hypothyroidism 11/03/2014  . Mixed hyperlipidemia 11/03/2014  . Chest pain   . Essential hypertension   . HLD (hyperlipidemia)     Past Surgical History:  Procedure Laterality Date  . CARDIAC CATHETERIZATION          Home Medications    Prior to Admission medications   Medication Sig Start Date End Date Taking? Authorizing Provider  amLODipine (NORVASC) 10 MG tablet Take 1 tablet (10 mg total) by mouth daily. 02/28/17  Yes Nida, Denman George, MD  insulin NPH-regular Human (NOVOLIN 70/30) (70-30) 100 UNIT/ML injection INJECT 65 UNITS TWICE DAILY WITH BREAKFAST AND SUPPER Patient taking differently: Inject 65 Units into the skin 2 (two) times daily with a meal.  03/07/17  Yes Nida, Denman George, MD  levothyroxine (SYNTHROID, LEVOTHROID) 75 MCG tablet Take 1 tablet (75 mcg  total) by mouth daily before breakfast. 04/08/16  Yes Nida, Denman George, MD  metFORMIN (GLUCOPHAGE) 1000 MG tablet TAKE 1 TABLET TWICE DAILY 02/28/17  Yes Nida, Denman George, MD  ondansetron (ZOFRAN) 4 MG tablet Take 1 tablet (4 mg total) by mouth every 6 (six) hours as needed for nausea or vomiting. 05/26/17   Loren Racer, MD  Stann Ore INSULIN SYRINGE 29G X 1/2" 0.5 ML MISC USE TWICE A DAY WITH INSULIN 03/31/16   Roma Kayser, MD    Family History Family History  Problem Relation Age of Onset  . Heart failure Mother   . Heart attack Father        x5     Social History Social History   Tobacco Use  . Smoking status: Former Smoker    Years: 6.00    Types: Cigarettes    Last attempt to quit: 01/25/2008    Years since quitting: 9.3  . Smokeless tobacco: Never Used  Substance Use Topics  . Alcohol use: Yes    Alcohol/week: 2.4 oz    Types: 4 Cans of beer per week    Comment: 3-4 beers weekly  . Drug use: No     Allergies   Benzocaine; Orange juice [orange oil]; and Grapefruit concentrate   Review of Systems Review of Systems  Constitutional: Positive for fatigue. Negative for chills and fever.  HENT: Negative for congestion and sinus pressure.   Eyes:  Negative for visual disturbance.  Respiratory: Negative for cough and shortness of breath.   Cardiovascular: Negative for chest pain and leg swelling.  Gastrointestinal: Positive for diarrhea and nausea. Negative for abdominal distention, abdominal pain, blood in stool and vomiting.  Genitourinary: Positive for frequency. Negative for dysuria.  Musculoskeletal: Negative for back pain, myalgias and neck pain.  Skin: Negative for rash and wound.  Neurological: Positive for light-headedness. Negative for weakness, numbness and headaches.  All other systems reviewed and are negative.    Physical Exam Updated Vital Signs BP (!) 151/97   Pulse 80   Temp 98.7 F (37.1 C) (Oral)   Resp (!) 24   Ht   (1.803 m)   Wt 108.9 kg (240 lb)   SpO2 94%   BMI 33.47 kg/m   Physical Exam  Constitutional: He is oriented to person, place, and time. He appears well-developed and well-nourished.  HENT:  Head: Normocephalic and atraumatic.  Mouth/Throat: Oropharynx is clear and moist. No oropharyngeal exudate.  Eyes: Pupils are equal, round, and reactive to light. EOM are normal.  Neck: Normal range of motion. Neck supple.  Cardiovascular: Normal rate and regular rhythm. Exam reveals no gallop and no friction rub.  No murmur heard. Pulmonary/Chest: Effort normal and breath sounds normal. No respiratory distress. He has no wheezes. He has no rales.  Abdominal: Soft. Bowel sounds are normal. He exhibits no distension and no mass. There is no tenderness. There is no rebound and no guarding. No hernia.  Musculoskeletal: Normal range of motion. He exhibits no edema or tenderness.  No lower extremity swelling, asymmetry or tenderness.  Distal pulses are 2+.  Lymphadenopathy:    He has no cervical adenopathy.  Neurological: He is alert and oriented to person, place, and time.  Moves all extremities without focal weakness.  Sensation fully intact.  Skin: Skin is warm and dry. Capillary refill takes less than 2 seconds. No rash noted. He is not diaphoretic. No erythema.  Psychiatric: He has a normal mood and affect. His behavior is normal.  Nursing note and vitals reviewed.    ED Treatments / Results  Labs (all labs ordered are listed, but only abnormal results are displayed) Labs Reviewed  COMPREHENSIVE METABOLIC PANEL - Abnormal; Notable for the following components:      Result Value   Chloride 97 (*)    Glucose, Bld 361 (*)    All other components within normal limits  URINALYSIS, ROUTINE W REFLEX MICROSCOPIC - Abnormal; Notable for the following components:   APPearance HAZY (*)    Hgb urine dipstick LARGE (*)    Protein, ur 30 (*)    RBC / HPF >50 (*)    Bacteria, UA MANY (*)    All other  components within normal limits  CBG MONITORING, ED - Abnormal; Notable for the following components:   Glucose-Capillary 131 (*)    All other components within normal limits  URINE CULTURE  LIPASE, BLOOD  CBC WITH DIFFERENTIAL/PLATELET    EKG EKG Interpretation  Date/Time:  Friday May 26 2017 20:49:40 EDT Ventricular Rate:  92 PR Interval:    QRS Duration: 98 QT Interval:  353 QTC Calculation: 437 R Axis:   0 Text Interpretation:  Sinus rhythm Borderline T wave abnormalities When compared to prior, faster rate.  No STEMI Confirmed by Theda Belfast ([id number]) on 05/27/2017 7:51:32 PM   Radiology No results found.  Procedures Procedures (including critical care time)  Medications Ordered in ED Medications  insulin aspart (  novoLOG) injection 10 Units (10 Units Intravenous Given 05/26/17 2041)  sodium chloride 0.9 % bolus 1,000 mL (0 mLs Intravenous Stopped 05/26/17 2125)     Initial Impression / Assessment and Plan / ED Course  I have reviewed the triage vital signs and the nursing notes.  Pertinent labs & imaging results that were available during my care of the patient were reviewed by me and considered in my medical decision making (see chart for details).     Patient is well-appearing.  Abdominal exam is benign.  Elevation in blood sugar.  Given insulin and IV fluids.  Anticipate discharge home.  Final Clinical Impressions(s) / ED Diagnoses   Final diagnoses:  Diarrhea, unspecified type  Hyperglycemia  Diarrhea of infectious origin    ED Discharge Orders        Ordered    ondansetron (ZOFRAN) 4 MG tablet  Every 6 hours PRN     05/26/17 2125       Loren Racer, MD 05/27/17 2134

## 2017-05-29 LAB — URINE CULTURE: CULTURE: NO GROWTH

## 2017-06-09 ENCOUNTER — Ambulatory Visit: Payer: Self-pay | Admitting: "Endocrinology

## 2017-08-07 ENCOUNTER — Ambulatory Visit: Payer: Self-pay | Admitting: "Endocrinology

## 2017-08-29 ENCOUNTER — Other Ambulatory Visit: Payer: Self-pay | Admitting: "Endocrinology

## 2017-09-01 ENCOUNTER — Other Ambulatory Visit: Payer: Self-pay | Admitting: "Endocrinology

## 2017-10-11 ENCOUNTER — Emergency Department (HOSPITAL_COMMUNITY)
Admission: EM | Admit: 2017-10-11 | Discharge: 2017-10-11 | Disposition: A | Discharge status: Home / Self Care | Payer: BLUE CROSS/BLUE SHIELD | Attending: Emergency Medicine | Admitting: Emergency Medicine

## 2017-10-11 ENCOUNTER — Other Ambulatory Visit: Payer: Self-pay

## 2017-10-11 ENCOUNTER — Emergency Department (HOSPITAL_COMMUNITY): Payer: BLUE CROSS/BLUE SHIELD

## 2017-10-11 ENCOUNTER — Encounter (HOSPITAL_COMMUNITY): Payer: Self-pay | Admitting: Emergency Medicine

## 2017-10-11 DIAGNOSIS — Z794 Long term (current) use of insulin: Secondary | ICD-10-CM | POA: Diagnosis not present

## 2017-10-11 DIAGNOSIS — E1151 Type 2 diabetes mellitus with diabetic peripheral angiopathy without gangrene: Secondary | ICD-10-CM | POA: Diagnosis not present

## 2017-10-11 DIAGNOSIS — Z87891 Personal history of nicotine dependence: Secondary | ICD-10-CM | POA: Diagnosis not present

## 2017-10-11 DIAGNOSIS — Z8674 Personal history of sudden cardiac arrest: Secondary | ICD-10-CM | POA: Insufficient documentation

## 2017-10-11 DIAGNOSIS — E039 Hypothyroidism, unspecified: Secondary | ICD-10-CM | POA: Diagnosis not present

## 2017-10-11 DIAGNOSIS — Z79899 Other long term (current) drug therapy: Secondary | ICD-10-CM | POA: Insufficient documentation

## 2017-10-11 DIAGNOSIS — M542 Cervicalgia: Secondary | ICD-10-CM | POA: Diagnosis not present

## 2017-10-11 DIAGNOSIS — R079 Chest pain, unspecified: Secondary | ICD-10-CM | POA: Diagnosis not present

## 2017-10-11 MED ORDER — NAPROXEN 500 MG PO TABS: 500.0000 mg | ORAL_TABLET | Freq: Two times a day (BID) | ORAL | 0 refills | Status: DC

## 2017-10-11 MED ORDER — TRAMADOL HCL 50 MG PO TABS: 50.0000 mg | ORAL_TABLET | Freq: Four times a day (QID) | ORAL | 0 refills | Status: DC | PRN

## 2017-10-11 NOTE — ED Triage Notes (Signed)
Pt reports 2 days of neck pain and bilateral arms/ feet numbness intermittently.

## 2017-10-11 NOTE — Discharge Instructions (Addendum)
Follow-up with your family doctor in 1 to 2 weeks for recheck 

## 2017-10-11 NOTE — ED Provider Notes (Signed)
[facility] EMERGENCY DEPARTMENT Provider Note   CSN: [id number] Arrival date & time: 10/11/17  1452     History   Chief Complaint Chief Complaint  Patient presents with  . Neck Pain    HPI Nathan [name] is a 42 y.o. male.  Patient complains of upper left-sided neck pain.  Is been going on for months.  Worse with movement.  No fever chills  The history is provided by the patient. No language interpreter was used.  Neck Pain   This is a new problem. The current episode started more than 1 week ago. The problem occurs constantly. The problem has not changed since onset.Associated with: movement. There has been no fever. Pain location: post neck. The quality of the pain is described as shooting. The pain radiates to the left scapula. The pain is at a severity of 4/10. The pain is moderate. The symptoms are aggravated by bending. The pain is worse during the day. Pertinent negatives include no photophobia, no chest pain and no headaches. He has tried nothing for the symptoms. The treatment provided no relief.    Past Medical History:  Diagnosis Date  . Cardiac arrest (HCC)   . Chest pain   . Diabetes mellitus   . HLD (hyperlipidemia)   . Hypertension   . Obesity   . Pneumonia   . Syncope   . Thyroid disease   . TMJ (dislocation of temporomandibular joint)     Patient Active Problem List   Diagnosis Date Noted  . Personal history of noncompliance with medical treatment, presenting hazards to health 04/08/2016  . Normal coronary arteries 09/04/2015  . Type 2 diabetes mellitus with vascular disease (HCC) 12/12/2014  . Primary hypothyroidism 11/03/2014  . Mixed hyperlipidemia 11/03/2014  . Chest pain   . Essential hypertension   . HLD (hyperlipidemia)     Past Surgical History:  Procedure Laterality Date  . CARDIAC CATHETERIZATION     no blockage  . HAND SURGERY          Home Medications    Prior to Admission medications   Medication Sig Start Date End Date  Taking? Authorizing Provider  amLODipine (NORVASC) 10 MG tablet Take 1 tablet (10 mg total) by mouth daily. 02/28/17  Yes Nida, Denman George, MD  insulin NPH-regular Human (NOVOLIN 70/30) (70-30) 100 UNIT/ML injection INJECT 65 UNITS TWICE DAILY WITH BREAKFAST AND SUPPER Patient taking differently: Inject 65 Units into the skin 2 (two) times daily with a meal.  03/07/17  Yes Nida, Denman George, MD  levothyroxine (SYNTHROID, LEVOTHROID) 75 MCG tablet Take 1 tablet (75 mcg total) by mouth daily before breakfast. 04/08/16  Yes Nida, Denman George, MD  metFORMIN (GLUCOPHAGE) 1000 MG tablet TAKE 1 TABLET BY MOUTH TWICE DAILY Patient taking differently: Take 1,000 mg by mouth 2 (two) times daily with a meal.  08/30/17  Yes Nida, Denman George, MD  naproxen (NAPROSYN) 500 MG tablet Take 1 tablet (500 mg total) by mouth 2 (two) times daily. 10/11/17   Bethann Berkshire, MD  traMADol (ULTRAM) 50 MG tablet Take 1 tablet (50 mg total) by mouth every 6 (six) hours as needed. 10/11/17   Bethann Berkshire, MD  Stann Ore INSULIN SYRINGE 29G X 1/2" 0.5 ML MISC USE TWICE A DAY WITH INSULIN 09/04/17   Roma Kayser, MD    Family History Family History  Problem Relation Age of Onset  . Heart failure Mother   . Heart attack Father        x5  Social History Social History   Tobacco Use  . Smoking status: Former Smoker    Years: 6.00    Types: Cigarettes    Last attempt to quit: 01/25/2008    Years since quitting: 9.7  . Smokeless tobacco: Never Used  Substance Use Topics  . Alcohol use: Yes    Alcohol/week: 4.0 standard drinks    Types: 4 Cans of beer per week    Comment: 3-4 beers weekly  . Drug use: No     Allergies   Benzocaine; Orange juice [orange oil]; and Grapefruit concentrate   Review of Systems Review of Systems  Constitutional: Negative for appetite change and fatigue.  HENT: Negative for congestion, ear discharge and sinus pressure.   Eyes: Negative for photophobia and  discharge.  Respiratory: Negative for cough.   Cardiovascular: Negative for chest pain.  Gastrointestinal: Negative for abdominal pain and diarrhea.  Genitourinary: Negative for frequency and hematuria.  Musculoskeletal: Positive for neck pain. Negative for back pain.  Skin: Negative for rash.  Neurological: Negative for seizures and headaches.  Psychiatric/Behavioral: Negative for hallucinations.     Physical Exam Updated Vital Signs BP (!) 140/96 (BP Location: Right Arm)   Pulse 80   Temp 97.7 F (36.5 C) (Oral)   Resp 12   Ht 5\' 11"  (1.803 m)   Wt 99.8 kg   SpO2 94%   BMI 30.68 kg/m   Physical Exam  Constitutional: He is oriented to person, place, and time. He appears well-developed.  HENT:  Head: Normocephalic.  Tender upper left wrist your neck  Eyes: Conjunctivae and EOM are normal. No scleral icterus.  Neck: Neck supple. No thyromegaly present.  Cardiovascular: Normal rate and regular rhythm. Exam reveals no gallop and no friction rub.  No murmur heard. Pulmonary/Chest: No stridor. He has no wheezes. He has no rales. He exhibits no tenderness.  Abdominal: He exhibits no distension. There is no tenderness. There is no rebound.  Musculoskeletal: Normal range of motion. He exhibits no edema.  Lymphadenopathy:    He has no cervical adenopathy.  Neurological: He is oriented to person, place, and time. He exhibits normal muscle tone. Coordination normal.  Skin: No rash noted. No erythema.  Psychiatric: He has a normal mood and affect. His behavior is normal.     ED Treatments / Results  Labs (all labs ordered are listed, but only abnormal results are displayed) Labs Reviewed - No data to display  EKG None  Radiology Dg Chest 2 View  Result Date: 10/11/2017 CLINICAL DATA:  Chest pain EXAM: CHEST - 2 VIEW COMPARISON:  11/04/2016 FINDINGS: The heart size and mediastinal contours are within normal limits. Both lungs are clear. The visualized skeletal structures  are unremarkable. IMPRESSION: No active cardiopulmonary disease. Electronically Signed   By: Alcide CleverMark  Lukens M.D.   On: 10/11/2017 16:44   Dg Cervical Spine Complete  Result Date: 10/11/2017 CLINICAL DATA:  Neck pain for 2 days, no known injury, initial encounter EXAM: CERVICAL SPINE - COMPLETE 4+ VIEW COMPARISON:  None. FINDINGS: Seven cervical segments are well visualized. Vertebral body height is well maintained. No prevertebral soft tissue swelling is seen. No significant neural foraminal narrowing is seen. The visualized chest is unremarkable. The odontoid is unremarkable as well. IMPRESSION: No acute abnormality noted. Electronically Signed   By: Alcide CleverMark  Lukens M.D.   On: 10/11/2017 16:45    Procedures Procedures (including critical care time)  Medications Ordered in ED Medications - No data to display   Initial Impression /  Assessment and Plan / ED Course  I have reviewed the triage vital signs and the nursing notes.  Pertinent labs & imaging results that were available during my care of the patient were reviewed by me and considered in my medical decision making (see chart for details).     X-rays unremarkable.  Will treat for musculoskeletal pain.  Patient will be given Naprosyn and Ultram and will follow-up with PCP  Final Clinical Impressions(s) / ED Diagnoses   Final diagnoses:  Neck pain    ED Discharge Orders         Ordered    naproxen (NAPROSYN) 500 MG tablet  2 times daily     10/11/17 1721    traMADol (ULTRAM) 50 MG tablet  Every 6 hours PRN     10/11/17 1721           Bethann Berkshire, MD 10/11/17 1726

## 2017-11-06 DIAGNOSIS — I10 Essential (primary) hypertension: Secondary | ICD-10-CM | POA: Diagnosis not present

## 2017-11-06 DIAGNOSIS — Z0000 Encounter for general adult medical examination without abnormal findings: Secondary | ICD-10-CM | POA: Diagnosis not present

## 2017-11-06 DIAGNOSIS — Z0001 Encounter for general adult medical examination with abnormal findings: Secondary | ICD-10-CM | POA: Diagnosis not present

## 2017-11-06 DIAGNOSIS — E063 Autoimmune thyroiditis: Secondary | ICD-10-CM | POA: Diagnosis not present

## 2017-11-06 DIAGNOSIS — E1140 Type 2 diabetes mellitus with diabetic neuropathy, unspecified: Secondary | ICD-10-CM | POA: Diagnosis not present

## 2017-11-06 DIAGNOSIS — E6609 Other obesity due to excess calories: Secondary | ICD-10-CM | POA: Diagnosis not present

## 2017-11-06 DIAGNOSIS — Z6832 Body mass index (BMI) 32.0-32.9, adult: Secondary | ICD-10-CM | POA: Diagnosis not present

## 2017-11-06 DIAGNOSIS — E119 Type 2 diabetes mellitus without complications: Secondary | ICD-10-CM | POA: Diagnosis not present

## 2017-11-07 DIAGNOSIS — I10 Essential (primary) hypertension: Secondary | ICD-10-CM | POA: Diagnosis not present

## 2017-12-04 ENCOUNTER — Ambulatory Visit (INDEPENDENT_AMBULATORY_CARE_PROVIDER_SITE_OTHER): Payer: BLUE CROSS/BLUE SHIELD | Admitting: Endocrinology

## 2017-12-04 ENCOUNTER — Encounter: Payer: Self-pay | Admitting: Endocrinology

## 2017-12-04 VITALS — BP 138/78 | HR 94 | Ht 71.0 in | Wt 224.8 lb

## 2017-12-04 DIAGNOSIS — E1159 Type 2 diabetes mellitus with other circulatory complications: Secondary | ICD-10-CM

## 2017-12-04 LAB — POCT GLYCOSYLATED HEMOGLOBIN (HGB A1C): Hemoglobin A1C: 11.6 % — AB (ref 4.0–5.6)

## 2017-12-04 MED ORDER — INSULIN DEGLUDEC 200 UNIT/ML ~~LOC~~ SOPN: 150.0000 [IU] | PEN_INJECTOR | Freq: Every day | SUBCUTANEOUS | 11 refills | Status: DC

## 2017-12-04 NOTE — Patient Instructions (Signed)
good diet and exercise significantly improve the control of your diabetes.  please let me know if you wish to be referred to a dietician.  high blood sugar is very risky to your health.  you should see an eye doctor and dentist every year.  It is very important to get all recommended vaccinations.  Controlling your blood pressure and cholesterol drastically reduces the damage diabetes does to your body.  Those who smoke should quit.  Please discuss these with your doctor.  check your blood sugar twice a day.  vary the time of day when you check, between before the 3 meals, and at bedtime.  also check if you have symptoms of your blood sugar being too high or too low.  please keep a record of the readings and bring it to your next appointment here (or you can bring the meter itself).  You can write it on any piece of paper.  please call us sooner if your blood sugar goes below 70, or if you have a lot of readings over 200. You can stay off the metformin.   I have sent a prescription to your pharmacy, to change to "Tresiba," 150 units daily.  Please call or message [place] next week, to tell us how the blood sugar is doing.   Please come back for a follow-up appointment in 1 month.

## 2017-12-04 NOTE — Progress Notes (Signed)
Subjective:    Patient ID: Nathan [name], male    DOB: 1975/05/16, 42 y.o.   MRN: [id number]  HPI pt is referred by Dr Sherwood Gambler, for diabetes.  Pt states DM was dx'ed in 2010; he has severe neuropathy of the lower extremities, and assoc pain; he is unaware of any associated chronic complications; he has been on insulin since 2017; pt says his diet is fair, and exercise is good; he has never had pancreatitis, pancreatic surgery, severe hypoglycemia or DKA.  He works as a Science writer, 3rd shift.  He says cbg's are in the 200's.  He takes BID premixed insulin.   Past Medical History:  Diagnosis Date  . Cardiac arrest (HCC)   . Chest pain   . Diabetes mellitus   . HLD (hyperlipidemia)   . Hypertension   . Obesity   . Pneumonia   . Syncope   . Thyroid disease   . TMJ (dislocation of temporomandibular joint)     Past Surgical History:  Procedure Laterality Date  . CARDIAC CATHETERIZATION     no blockage  . HAND SURGERY      Social History   Socioeconomic History  . Marital status: Married    Spouse name: Not on file  . Number of children: Not on file  . Years of education: Not on file  . Highest education level: Not on file  Occupational History  . Not on file  Social Needs  . Financial resource strain: Not on file  . Food insecurity:    Worry: Not on file    Inability: Not on file  . Transportation needs:    Medical: Not on file    Non-medical: Not on file  Tobacco Use  . Smoking status: Former Smoker    Years: 6.00    Types: Cigarettes    Last attempt to quit: 01/25/2008    Years since quitting: 9.8  . Smokeless tobacco: Never Used  Substance and Sexual Activity  . Alcohol use: Yes    Alcohol/week: 4.0 standard drinks    Types: 4 Cans of beer per week    Comment: 3-4 beers weekly  . Drug use: No  . Sexual activity: Not on file  Lifestyle  . Physical activity:    Days per week: Not on file    Minutes per session: Not on file  . Stress: Not on file    Relationships  . Social connections:    Talks on phone: Not on file    Gets together: Not on file    Attends religious service: Not on file    Active member of club or organization: Not on file    Attends meetings of clubs or organizations: Not on file    Relationship status: Not on file  . Intimate partner violence:    Fear of current or ex partner: Not on file    Emotionally abused: Not on file    Physically abused: Not on file    Forced sexual activity: Not on file  Other Topics Concern  . Not on file  Social History Narrative  . Not on file    Current Outpatient Medications on File Prior to Visit  Medication Sig Dispense Refill  . amLODipine (NORVASC) 10 MG tablet Take 1 tablet (10 mg total) by mouth daily. 30 tablet 3  . levothyroxine (SYNTHROID, LEVOTHROID) 75 MCG tablet Take 1 tablet (75 mcg total) by mouth daily before breakfast. 30 tablet 2  . naproxen (NAPROSYN) 500 MG tablet Take  1 tablet (500 mg total) by mouth 2 (two) times daily. 30 tablet 0  . ULTICARE INSULIN SYRINGE 29G X 1/2" 0.5 ML MISC USE TWICE A DAY WITH INSULIN 100 each 3   No current facility-administered medications on file prior to visit.     Allergies  Allergen Reactions  . Benzocaine Anaphylaxis and Other (See Comments)    REACTION: Blistering all over and throat swells shut  . Orange Juice [Orange Oil] Anaphylaxis  . Grapefruit Concentrate     Family History  Problem Relation Age of Onset  . Heart failure Mother   . Diabetes Mother   . Heart attack Father        x5   . Diabetes Father     BP 138/78 (BP Location: Left Arm, Patient Position: Sitting, Cuff Size: Large)   Pulse 94   Ht 5\' 11"  (1.803 m)   Wt 224 lb 12.8 oz (102 kg)   SpO2 96%   BMI 31.35 kg/m    Review of Systems denies weight loss, blurry vision, headache, chest pain, sob, n/v, excessive diaphoresis, memory loss, depression, and easy bruising.  He has lost 30 lbs x 6 months (unintentional).  He has rhinorrhea,  polyuria, leg cramps, and cold intolerance.    Objective:   Physical Exam VS: see vs page GEN: no distress HEAD: head: no deformity eyes: no periorbital swelling, no proptosis external nose and ears are normal mouth: no lesion seen NECK: supple, thyroid is not enlarged CHEST WALL: no deformity LUNGS: clear to auscultation CV: reg rate and rhythm, no murmur ABD: abdomen is soft, nontender.  no hepatosplenomegaly.  not distended.  no hernia MUSCULOSKELETAL: muscle bulk and strength are grossly normal.  no obvious joint swelling.  gait is normal and steady EXTEMITIES: no deformity.  no ulcer on the feet.  feet are of normal color and temp.  Trace bilat leg edema PULSES: dorsalis pedis intact bilat.  no carotid bruit NEURO:  cn 2-12 grossly intact.   readily moves all 4's.  sensation is intact to touch on the feet, but decreased from normal SKIN:  Normal texture and temperature.  No rash or suspicious lesion is visible.   NODES:  None palpable at the neck PSYCH: alert, well-oriented.  Does not appear anxious nor depressed.    outside test results are reviewed: a1c > 14%  Lab Results  Component Value Date   CREATININE 0.66 05/26/2017   BUN 9 05/26/2017   NA 136 05/26/2017   K 3.6 05/26/2017   CL 97 (L) 05/26/2017   CO2 26 05/26/2017   I have reviewed outside records, and summarized: Pt was noted to have severely elevated a1c, and referred here.  He has been seen in ER several times, for various reasons  I personally reviewed electrocardiogram tracing (05/26/17): Indication: diarrhea Impression: NSR.  No MI.  No hypertrophy. Compared to 2018: no significant change     Assessment & Plan:  Insulin-requiring type 2 DM, with polyneuropathy, severe exacerbation.  Occupational status (3rd shift), new to me.  He needs a very slow time-release qd insulin.  Patient Instructions  good diet and exercise significantly improve the control of your diabetes.  please let me know if you wish  to be referred to a dietician.  high blood sugar is very risky to your health.  you should see an eye doctor and dentist every year.  It is very important to get all recommended vaccinations.  Controlling your blood pressure and cholesterol drastically reduces the  damage diabetes does to your body.  Those who smoke should quit.  Please discuss these with your doctor.  check your blood sugar twice a day.  vary the time of day when you check, between before the 3 meals, and at bedtime.  also check if you have symptoms of your blood sugar being too high or too low.  please keep a record of the readings and bring it to your next appointment here (or you can bring the meter itself).  You can write it on any piece of paper.  please call us sooner if your blood sugar goes below 70, or if you have a lot of readings over 200. You can stay off the metformin.   I have sent a prescription to your pharmacy, to change to "Tresiba," 150 units daily.  Please call or message [place] next week, to tell us how the blood sugar is doing.   Please come back for a follow-up appointment in 1 month.

## 2018-01-01 ENCOUNTER — Ambulatory Visit: Payer: PRIVATE HEALTH INSURANCE | Admitting: Endocrinology

## 2018-01-09 ENCOUNTER — Encounter: Payer: Self-pay | Admitting: Endocrinology

## 2018-01-09 ENCOUNTER — Ambulatory Visit (INDEPENDENT_AMBULATORY_CARE_PROVIDER_SITE_OTHER): Payer: BLUE CROSS/BLUE SHIELD | Admitting: Endocrinology

## 2018-01-09 VITALS — BP 158/102 | HR 97 | Ht 71.0 in | Wt 232.6 lb

## 2018-01-09 DIAGNOSIS — E1159 Type 2 diabetes mellitus with other circulatory complications: Secondary | ICD-10-CM

## 2018-01-09 MED ORDER — INSULIN DEGLUDEC 200 UNIT/ML ~~LOC~~ SOPN: 160.0000 [IU] | PEN_INJECTOR | Freq: Every day | SUBCUTANEOUS | 11 refills | Status: DC

## 2018-01-09 NOTE — Progress Notes (Signed)
Subjective:    Patient ID: Nathan [name], male    DOB: 06/27/1975, 42 y.o.   MRN: [id number]  HPI Pt returns for f/u of diabetes mellitus: DM type: Insulin-requiring type 2. 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  no cbg record, but states cbg's vary from 180-230.  There is no trend throughout the day.  Blurry vision is much better.  pt states Nathan [name] feels well in general.  Past Medical History:  Diagnosis Date  . Cardiac arrest (HCC)   . Chest pain   . Diabetes mellitus   . HLD (hyperlipidemia)   . Hypertension   . Obesity   . Pneumonia   . Syncope   . Thyroid disease   . TMJ (dislocation of temporomandibular joint)     Past Surgical History:  Procedure Laterality Date  . CARDIAC CATHETERIZATION     no blockage  . HAND SURGERY      Social History   Socioeconomic History  . Marital status: Married    Spouse name: Not on file  . Number of children: Not on file  . Years of education: Not on file  . Highest education level: Not on file  Occupational History  . Not on file  Social Needs  . Financial resource strain: Not on file  . Food insecurity:    Worry: Not on file    Inability: Not on file  . Transportation needs:    Medical: Not on file    Non-medical: Not on file  Tobacco Use  . Smoking status: Former Smoker    Years: 6.00    Types: Cigarettes    Last attempt to quit: 01/25/2008    Years since quitting: 9.9  . Smokeless tobacco: Never Used  Substance and Sexual Activity  . Alcohol use: Yes    Alcohol/week: 4.0 standard drinks    Types: 4 Cans of beer per week    Comment: 3-4 beers weekly  . Drug use: No  . Sexual activity: Not on file  Lifestyle  . Physical activity:    Days per week: Not on file    Minutes per session: Not on file  .  Stress: Not on file  Relationships  . Social connections:    Talks on phone: Not on file    Gets together: Not on file    Attends religious service: Not on file    Active member of club or organization: Not on file    Attends meetings of clubs or organizations: Not on file    Relationship status: Not on file  . Intimate partner violence:    Fear of current or ex partner: Not on file    Emotionally abused: Not on file    Physically abused: Not on file    Forced sexual activity: Not on file  Other Topics Concern  . Not on file  Social History Narrative  . Not on file    Current Outpatient Medications on File Prior to Visit  Medication Sig Dispense Refill  . amLODipine (NORVASC) 10 MG tablet Take 1 tablet (10 mg total) by mouth daily. 30 tablet 3  . levothyroxine (SYNTHROID, LEVOTHROID) 75 MCG tablet Take 1 tablet (75 mcg total) by mouth daily before breakfast. 30 tablet 2  . naproxen (NAPROSYN) 500 MG tablet Take 1 tablet (500 mg total) by mouth 2 (two)  times daily. 30 tablet 0  . ULTICARE INSULIN SYRINGE 29G X 1/2" 0.5 ML MISC USE TWICE A DAY WITH INSULIN 100 each 3   No current facility-administered medications on file prior to visit.     Allergies  Allergen Reactions  . Benzocaine Anaphylaxis and Other (See Comments)    REACTION: Blistering all over and throat swells shut  . Orange Juice [Orange Oil] Anaphylaxis  . Grapefruit Concentrate     Family History  Problem Relation Age of Onset  . Heart failure Mother   . Diabetes Mother   . Heart attack Father        x5   . Diabetes Father     BP (!) 158/102 (BP Location: Left Arm, Patient Position: Sitting, Cuff Size: Normal)   Pulse 97   Ht 5\' 11"  (1.803 m)   Wt 232 lb 9.6 oz (105.5 kg)   SpO2 97%   BMI 32.44 kg/m   Review of Systems Nathan [name] denies hypoglycemia.      Objective:   Physical Exam VITAL SIGNS:  See vs page GENERAL: no distress Pulses: dorsalis pedis intact bilat.   MSK: no deformity of the feet CV:  no leg edema Skin:  no ulcer on the feet.  normal color and temp on the feet. Neuro: sensation is intact to touch on the feet, but decreased from normal.         Assessment & Plan:  Insulin-requiring type 2 DM: Nathan [name] needs increased rx  Patient Instructions  Your blood pressure is high today.  Please see your primary care provider soon, to have it rechecked check your blood sugar twice a day.  vary the time of day when you check, between before the 3 meals, and at bedtime.  also check if you have symptoms of your blood sugar being too high or too low.  please keep a record of the readings and bring it to your next appointment here (or you can bring the meter itself).  You can write it on any piece of paper.  please call us sooner if your blood sugar goes below 70, or if you have a lot of readings over 200. Please increase theTresiba to 160 units daily.   Our goal is for the blood sugar to be in the low to mid-100's.  Please come back for a follow-up appointment in 1 month.

## 2018-01-09 NOTE — Patient Instructions (Addendum)
Your blood pressure is high today.  Please see your primary care provider soon, to have it rechecked check your blood sugar twice a day.  vary the time of day when you check, between before the 3 meals, and at bedtime.  also check if you have symptoms of your blood sugar being too high or too low.  please keep a record of the readings and bring it to your next appointment here (or you can bring the meter itself).  You can write it on any piece of paper.  please call us sooner if your blood sugar goes below 70, or if you have a lot of readings over 200. Please increase theTresiba to 160 units daily.   Our goal is for the blood sugar to be in the low to mid-100's.  Please come back for a follow-up appointment in 1 month.

## 2018-02-10 ENCOUNTER — Other Ambulatory Visit: Payer: Self-pay | Admitting: "Endocrinology

## 2018-02-10 DIAGNOSIS — Z79899 Other long term (current) drug therapy: Secondary | ICD-10-CM | POA: Diagnosis not present

## 2018-02-10 DIAGNOSIS — Z9119 Patient's noncompliance with other medical treatment and regimen: Secondary | ICD-10-CM | POA: Diagnosis not present

## 2018-02-10 DIAGNOSIS — E039 Hypothyroidism, unspecified: Secondary | ICD-10-CM | POA: Diagnosis not present

## 2018-02-10 DIAGNOSIS — R51 Headache: Secondary | ICD-10-CM | POA: Diagnosis not present

## 2018-02-10 DIAGNOSIS — F17200 Nicotine dependence, unspecified, uncomplicated: Secondary | ICD-10-CM | POA: Diagnosis not present

## 2018-02-10 DIAGNOSIS — E119 Type 2 diabetes mellitus without complications: Secondary | ICD-10-CM | POA: Diagnosis not present

## 2018-02-10 DIAGNOSIS — I10 Essential (primary) hypertension: Secondary | ICD-10-CM | POA: Diagnosis not present

## 2018-02-13 ENCOUNTER — Ambulatory Visit (INDEPENDENT_AMBULATORY_CARE_PROVIDER_SITE_OTHER): Payer: BLUE CROSS/BLUE SHIELD | Admitting: Endocrinology

## 2018-02-13 ENCOUNTER — Encounter: Payer: Self-pay | Admitting: Endocrinology

## 2018-02-13 VITALS — BP 152/90 | HR 90 | Ht 71.0 in | Wt 233.2 lb

## 2018-02-13 DIAGNOSIS — E1159 Type 2 diabetes mellitus with other circulatory complications: Secondary | ICD-10-CM | POA: Diagnosis not present

## 2018-02-13 DIAGNOSIS — E039 Hypothyroidism, unspecified: Secondary | ICD-10-CM | POA: Diagnosis not present

## 2018-02-13 DIAGNOSIS — I10 Essential (primary) hypertension: Secondary | ICD-10-CM | POA: Diagnosis not present

## 2018-02-13 LAB — BASIC METABOLIC PANEL
BUN: 11 mg/dL (ref 6–23)
CALCIUM: 9.4 mg/dL (ref 8.4–10.5)
CO2: 28 meq/L (ref 19–32)
CREATININE: 0.75 mg/dL (ref 0.40–1.50)
Chloride: 98 mEq/L (ref 96–112)
GFR: 114.69 mL/min (ref >60.00)
Glucose, Bld: 369 mg/dL — ABNORMAL HIGH (ref 70–99)
Potassium: 4 mEq/L (ref 3.5–5.1)
Sodium: 134 mEq/L — ABNORMAL LOW (ref 135–145)

## 2018-02-13 LAB — POCT GLYCOSYLATED HEMOGLOBIN (HGB A1C): HEMOGLOBIN A1C: 12 % — AB (ref 4.0–5.6)

## 2018-02-13 LAB — TSH: TSH: 2.2 u[IU]/mL (ref 0.35–4.50)

## 2018-02-13 MED ORDER — SEMAGLUTIDE(0.25 OR 0.5MG/DOS) 2 MG/1.5ML ~~LOC~~ SOPN: 0.2500 mg | PEN_INJECTOR | SUBCUTANEOUS | 3 refills | Status: DC

## 2018-02-13 NOTE — Progress Notes (Signed)
Subjective:    Patient ID: Nathan [name], male    DOB: February 18, 1975, 43 y.o.   MRN: [id number]  HPI Pt returns for f/u of diabetes mellitus:  DM type: Insulin-requiring type 2. Dx'ed: 2010 Complications: polyneuropathy Therapy: insulin since 2017 DKA: never Severe hypoglycemia: never.   Pancreatitis: never Pancreatic imaging: normal on 2012 CT Other: he works as a Science writer, 3rd shift; he declines multiple daily injections.   Interval history: pt says he never misses the insulin.  no cbg record, but states cbg's vary from 215-230.  There is no trend throughout the day.  Pt says the insulin does not leak out.  pt states he feels well in general, except for blurry vision.   Pt reports labile HTN was worse in ER.   Past Medical History:  Diagnosis Date  . Cardiac arrest (HCC)   . Chest pain   . Diabetes mellitus   . HLD (hyperlipidemia)   . Hypertension   . Obesity   . Pneumonia   . Syncope   . Thyroid disease   . TMJ (dislocation of temporomandibular joint)     Past Surgical History:  Procedure Laterality Date  . CARDIAC CATHETERIZATION     no blockage  . HAND SURGERY      Social History   Socioeconomic History  . Marital status: Married    Spouse name: Not on file  . Number of children: Not on file  . Years of education: Not on file  . Highest education level: Not on file  Occupational History  . Not on file  Social Needs  . Financial resource strain: Not on file  . Food insecurity:    Worry: Not on file    Inability: Not on file  . Transportation needs:    Medical: Not on file    Non-medical: Not on file  Tobacco Use  . Smoking status: Former Smoker    Years: 6.00    Types: Cigarettes    Last attempt to quit: 01/25/2008    Years since quitting: 10.0  . Smokeless tobacco: Never Used  Substance and Sexual Activity  . Alcohol use: Yes    Alcohol/week: 4.0 standard drinks    Types: 4 Cans of beer per week    Comment: 3-4 beers weekly  . Drug use: No    . Sexual activity: Not on file  Lifestyle  . Physical activity:    Days per week: Not on file    Minutes per session: Not on file  . Stress: Not on file  Relationships  . Social connections:    Talks on phone: Not on file    Gets together: Not on file    Attends religious service: Not on file    Active member of club or organization: Not on file    Attends meetings of clubs or organizations: Not on file    Relationship status: Not on file  . Intimate partner violence:    Fear of current or ex partner: Not on file    Emotionally abused: Not on file    Physically abused: Not on file    Forced sexual activity: Not on file  Other Topics Concern  . Not on file  Social History Narrative  . Not on file    Current Outpatient Medications on File Prior to Visit  Medication Sig Dispense Refill  . amLODipine (NORVASC) 10 MG tablet Take 1 tablet (10 mg total) by mouth daily. 30 tablet 3  . Insulin Degludec (TRESIBA FLEXTOUCH)  200 UNIT/ML SOPN Inject 160 Units into the skin daily. And pen needles, 1/day 9 pen 11  . levothyroxine (SYNTHROID, LEVOTHROID) 75 MCG tablet Take 1 tablet (75 mcg total) by mouth daily before breakfast. 30 tablet 2  . naproxen (NAPROSYN) 500 MG tablet Take 1 tablet (500 mg total) by mouth 2 (two) times daily. 30 tablet 0  . ULTICARE INSULIN SYRINGE 29G X 1/2" 0.5 ML MISC USE TWICE A DAY WITH INSULIN 100 each 3   No current facility-administered medications on file prior to visit.     Allergies  Allergen Reactions  . Benzocaine Anaphylaxis and Other (See Comments)    REACTION: Blistering all over and throat swells shut  . Orange Juice [Orange Oil] Anaphylaxis  . Grapefruit Concentrate     Family History  Problem Relation Age of Onset  . Heart failure Mother   . Diabetes Mother   . Heart attack Father        x5   . Diabetes Father     BP (!) 152/90 (BP Location: Left Arm, Patient Position: Sitting, Cuff Size: Normal)   Pulse 90   Ht 5\' 11"  (1.803 m)    Wt 233 lb 3.2 oz (105.8 kg)   SpO2 97%   BMI 32.52 kg/m   Review of Systems He denies hypoglycemia    Objective:   Physical Exam VITAL SIGNS:  See vs page GENERAL: no distress Pulses: dorsalis pedis intact bilat.   MSK: no deformity of the feet CV: no leg edema Skin:  no ulcer on the feet.  normal color and temp on the feet. Neuro: sensation is intact to touch on the feet.    Lab Results  Component Value Date   CREATININE 0.75 02/13/2018   BUN 11 02/13/2018   NA 134 (L) 02/13/2018   K 4.0 02/13/2018   CL 98 02/13/2018   CO2 28 02/13/2018     Lab Results  Component Value Date   HGBA1C 12.0 (A) 02/13/2018       Assessment & Plan:  Labile HTN: is noted today.   Insulin-requiring type 2 DM, with PN: worse  Patient Instructions  Your blood pressure is high today.  Please see your primary care provider soon, to have it rechecked check your blood sugar twice a day.  vary the time of day when you check, between before the 3 meals, and at bedtime.  also check if you have symptoms of your blood sugar being too high or too low.  please keep a record of the readings and bring it to your next appointment here (or you can bring the meter itself).  You can write it on any piece of paper.  please call us sooner if your blood sugar goes below 70, or if you have a lot of readings over 200. Blood tests, and a 24-HR urine test, are requested for you today.  We'll let you know about the results.  I have sent a prescription to your pharmacy, to add "Ozempic."  Please call or message us next week, to tell us how the blood sugar is doing.   Our goal is for the blood sugar to be in the low to mid-100's.  Please come back for a follow-up appointment in 1 month.

## 2018-02-13 NOTE — Patient Instructions (Addendum)
Your blood pressure is high today.  Please see your primary care provider soon, to have it rechecked check your blood sugar twice a day.  vary the time of day when you check, between before the 3 meals, and at bedtime.  also check if you have symptoms of your blood sugar being too high or too low.  please keep a record of the readings and bring it to your next appointment here (or you can bring the meter itself).  You can write it on any piece of paper.  please call us sooner if your blood sugar goes below 70, or if you have a lot of readings over 200. Blood tests, and a 24-HR urine test, are requested for you today.  We'll let you know about the results.  I have sent a prescription to your pharmacy, to add "Ozempic."  Please call or message [place] next week, to tell us how the blood sugar is doing.   Our goal is for the blood sugar to be in the low to mid-100's.  Please come back for a follow-up appointment in 1 month.

## 2018-02-15 ENCOUNTER — Other Ambulatory Visit: Payer: BLUE CROSS/BLUE SHIELD

## 2018-02-15 DIAGNOSIS — I10 Essential (primary) hypertension: Secondary | ICD-10-CM

## 2018-02-18 LAB — CATECHOLAMINES, FRACTIONATED, URINE, 24 HOUR
CREATININE, URINE MG/DAY-CATEUR: 1.17 g/(24.h) (ref 0.50–2.15)
Calculated Total (E+NE): 48 mcg/24 h (ref 26–121)
DOPAMINE, 24 HR URINE: 266 ug/(24.h) (ref 52–480)
Norepinephrine, 24 hr Ur: 48 mcg/24 h (ref 15–100)
VOLUME, URINE-VMAUR: 5500 mL

## 2018-02-18 LAB — METANEPHRINES, PLASMA
METANEPHRINE FREE: 29 pg/mL (ref <=57)
NORMETANEPHRINE FREE: 49 pg/mL (ref <=148)
Total Metanephrines-Plasma: 78 pg/mL (ref <=205)

## 2018-02-18 LAB — METANEPHRINES, URINE, 24 HOUR
METANEPHRINES UR: 75 ug/(24.h) (ref 58–203)
Metaneph Total, Ur: 313 mcg/24 h (ref 182–739)
NORMETANEPHRINE 24H UR: 238 ug/(24.h) (ref 88–649)
VOLUME, URINE-VMAUR: 5500 mL

## 2018-02-18 LAB — CATECHOLAMINES, FRACTIONATED, PLASMA
Catecholamines, Total: 287 pg/mL
NOREPINEPHRINE: 287 pg/mL

## 2018-02-27 DIAGNOSIS — G459 Transient cerebral ischemic attack, unspecified: Secondary | ICD-10-CM | POA: Diagnosis not present

## 2018-02-27 DIAGNOSIS — E1140 Type 2 diabetes mellitus with diabetic neuropathy, unspecified: Secondary | ICD-10-CM | POA: Diagnosis not present

## 2018-02-27 DIAGNOSIS — I209 Angina pectoris, unspecified: Secondary | ICD-10-CM | POA: Diagnosis not present

## 2018-02-27 DIAGNOSIS — E063 Autoimmune thyroiditis: Secondary | ICD-10-CM | POA: Diagnosis not present

## 2018-02-27 DIAGNOSIS — I10 Essential (primary) hypertension: Secondary | ICD-10-CM | POA: Diagnosis not present

## 2018-02-27 DIAGNOSIS — Z6833 Body mass index (BMI) 33.0-33.9, adult: Secondary | ICD-10-CM | POA: Diagnosis not present

## 2018-02-27 DIAGNOSIS — Z1389 Encounter for screening for other disorder: Secondary | ICD-10-CM | POA: Diagnosis not present

## 2018-03-21 ENCOUNTER — Telehealth: Payer: Self-pay | Admitting: Endocrinology

## 2018-03-21 ENCOUNTER — Other Ambulatory Visit: Payer: Self-pay

## 2018-03-21 ENCOUNTER — Ambulatory Visit: Payer: BLUE CROSS/BLUE SHIELD | Admitting: Endocrinology

## 2018-03-21 DIAGNOSIS — E039 Hypothyroidism, unspecified: Secondary | ICD-10-CM

## 2018-03-21 MED ORDER — LEVOTHYROXINE SODIUM 75 MCG PO TABS: 75.0000 ug | ORAL_TABLET | Freq: Every day | ORAL | 2 refills | Status: DC

## 2018-03-21 NOTE — Telephone Encounter (Signed)
MEDICATION: levothyroxine (SYNTHROID, LEVOTHROID) 75 MCG tablet  PHARMACY:   [facility] [address] [place] - [address] [phone] (Phone) [phone] (Fax)     IS THIS A 90 DAY SUPPLY : No  IS PATIENT OUT OF MEDICATION: Yes  IF NOT; HOW MUCH IS LEFT: None  LAST APPOINTMENT DATE: @1 /21/2020  NEXT APPOINTMENT DATE:@Visit  date not found  DO WE HAVE YOUR PERMISSION TO LEAVE A DETAILED MESSAGE:Yes-ph# [phone]  OTHER COMMENTS:    **Let patient know to contact pharmacy at the end of the day to make sure medication is ready. **  ** Please notify patient to allow 48-72 hours to process**  **Encourage patient to contact the pharmacy for refills or they can request refills through [facility]**

## 2018-03-21 NOTE — Telephone Encounter (Signed)
levothyroxine (SYNTHROID, LEVOTHROID) 75 MCG tablet 30 tablet 2 03/21/2018    Sig - Route: Take 1 tablet (75 mcg total) by mouth daily before breakfast. - Oral   Sent to pharmacy as: levothyroxine (SYNTHROID, LEVOTHROID) 75 MCG tablet   E-Prescribing Status: Sent to pharmacy (03/21/2018 2:11 PM EST)

## 2018-03-21 NOTE — Telephone Encounter (Signed)
OK, please refill prn.

## 2018-03-21 NOTE — Telephone Encounter (Signed)
Please advise if refill is appropriate. Uncertain if you manage his thyroid as well.

## 2018-03-27 ENCOUNTER — Telehealth: Payer: Self-pay | Admitting: Endocrinology

## 2018-03-27 NOTE — Telephone Encounter (Signed)
Please increase tresiba to 200 units qd.  Please call or message [place] next week, to tell us how the blood sugar is doing

## 2018-03-27 NOTE — Telephone Encounter (Signed)
Insulin Degludec (TRESIBA FLEXTOUCH) 200 UNIT/ML SOPN   Patient's wife came into the office today for a visit with Dr Everardo All. She stopped by front desk to advise Dr Everardo All what is going on with the patients blood sugars with this new insulin She stated it is not working for the patient and has not brought his sugars to where they should be  Patients blood sugars are up near 420.    Symptoms :Bad headaches., nausea, and blood pressure is elevated. She stated he has not been feeling well and stays in the bed due to thins  Patient's wife stated his blood pressure this morning was  180/114

## 2018-03-27 NOTE — Telephone Encounter (Signed)
Are you aware of this 

## 2018-03-27 NOTE — Telephone Encounter (Signed)
Wife returned call. Informed of new orders. Verbalized acceptance and understanding.

## 2018-03-27 NOTE — Telephone Encounter (Signed)
LVM requesting returned call 

## 2018-05-21 DIAGNOSIS — Z720 Tobacco use: Secondary | ICD-10-CM | POA: Diagnosis not present

## 2018-05-21 DIAGNOSIS — R110 Nausea: Secondary | ICD-10-CM | POA: Diagnosis not present

## 2018-05-21 DIAGNOSIS — M545 Low back pain: Secondary | ICD-10-CM | POA: Diagnosis not present

## 2018-05-21 DIAGNOSIS — Z794 Long term (current) use of insulin: Secondary | ICD-10-CM | POA: Diagnosis not present

## 2018-05-21 DIAGNOSIS — I10 Essential (primary) hypertension: Secondary | ICD-10-CM | POA: Diagnosis not present

## 2018-05-21 DIAGNOSIS — F17200 Nicotine dependence, unspecified, uncomplicated: Secondary | ICD-10-CM | POA: Diagnosis not present

## 2018-05-21 DIAGNOSIS — M62830 Muscle spasm of back: Secondary | ICD-10-CM | POA: Diagnosis not present

## 2018-05-21 DIAGNOSIS — E119 Type 2 diabetes mellitus without complications: Secondary | ICD-10-CM | POA: Diagnosis not present

## 2018-05-21 DIAGNOSIS — Z79899 Other long term (current) drug therapy: Secondary | ICD-10-CM | POA: Diagnosis not present

## 2018-05-21 DIAGNOSIS — I160 Hypertensive urgency: Secondary | ICD-10-CM | POA: Diagnosis not present

## 2018-05-21 DIAGNOSIS — E039 Hypothyroidism, unspecified: Secondary | ICD-10-CM | POA: Diagnosis not present

## 2018-05-25 ENCOUNTER — Emergency Department (HOSPITAL_COMMUNITY)
Admission: EM | Admit: 2018-05-25 | Discharge: 2018-05-25 | Disposition: A | Discharge status: Home / Self Care | Payer: BLUE CROSS/BLUE SHIELD | Attending: Emergency Medicine | Admitting: Emergency Medicine

## 2018-05-25 ENCOUNTER — Emergency Department (HOSPITAL_COMMUNITY): Payer: BLUE CROSS/BLUE SHIELD

## 2018-05-25 ENCOUNTER — Other Ambulatory Visit: Payer: Self-pay

## 2018-05-25 ENCOUNTER — Encounter (HOSPITAL_COMMUNITY): Payer: Self-pay | Admitting: *Deleted

## 2018-05-25 DIAGNOSIS — X58XXXA Exposure to other specified factors, initial encounter: Secondary | ICD-10-CM | POA: Diagnosis not present

## 2018-05-25 DIAGNOSIS — S3992XA Unspecified injury of lower back, initial encounter: Secondary | ICD-10-CM | POA: Diagnosis not present

## 2018-05-25 DIAGNOSIS — I10 Essential (primary) hypertension: Secondary | ICD-10-CM | POA: Insufficient documentation

## 2018-05-25 DIAGNOSIS — Y939 Activity, unspecified: Secondary | ICD-10-CM | POA: Diagnosis not present

## 2018-05-25 DIAGNOSIS — Z79899 Other long term (current) drug therapy: Secondary | ICD-10-CM | POA: Insufficient documentation

## 2018-05-25 DIAGNOSIS — Z794 Long term (current) use of insulin: Secondary | ICD-10-CM | POA: Insufficient documentation

## 2018-05-25 DIAGNOSIS — Z87891 Personal history of nicotine dependence: Secondary | ICD-10-CM | POA: Diagnosis not present

## 2018-05-25 DIAGNOSIS — Y929 Unspecified place or not applicable: Secondary | ICD-10-CM | POA: Insufficient documentation

## 2018-05-25 DIAGNOSIS — Y999 Unspecified external cause status: Secondary | ICD-10-CM | POA: Diagnosis not present

## 2018-05-25 DIAGNOSIS — M545 Low back pain: Secondary | ICD-10-CM | POA: Diagnosis not present

## 2018-05-25 DIAGNOSIS — S39012A Strain of muscle, fascia and tendon of lower back, initial encounter: Secondary | ICD-10-CM | POA: Diagnosis not present

## 2018-05-25 DIAGNOSIS — M549 Dorsalgia, unspecified: Secondary | ICD-10-CM | POA: Diagnosis not present

## 2018-05-25 DIAGNOSIS — R109 Unspecified abdominal pain: Secondary | ICD-10-CM | POA: Diagnosis not present

## 2018-05-25 DIAGNOSIS — E119 Type 2 diabetes mellitus without complications: Secondary | ICD-10-CM | POA: Diagnosis not present

## 2018-05-25 LAB — URINALYSIS, ROUTINE W REFLEX MICROSCOPIC
Bacteria, UA: NONE SEEN
Bilirubin Urine: NEGATIVE
Glucose, UA: >=500 mg/dL — AB
Hgb urine dipstick: NEGATIVE
Ketones, ur: NEGATIVE mg/dL
Leukocytes,Ua: NEGATIVE
Nitrite: NEGATIVE
Protein, ur: NEGATIVE mg/dL
Specific Gravity, Urine: 1.031 — ABNORMAL HIGH (ref 1.005–1.030)
pH: 6 (ref 5.0–8.0)

## 2018-05-25 MED ORDER — DIAZEPAM 5 MG PO TABS
10.0000 mg | ORAL_TABLET | Freq: Once | ORAL | Status: AC
  Administered 2018-05-25: 10 mg via ORAL
  Filled 2018-05-25: qty 2

## 2018-05-25 MED ORDER — TRAMADOL HCL 50 MG PO TABS: 50.0000 mg | ORAL_TABLET | Freq: Four times a day (QID) | ORAL | 0 refills | Status: DC | PRN

## 2018-05-25 MED ORDER — ONDANSETRON HCL 4 MG PO TABS
4.0000 mg | ORAL_TABLET | Freq: Once | ORAL | Status: AC
  Administered 2018-05-25: 13:00:00 4 mg via ORAL
  Filled 2018-05-25: qty 1

## 2018-05-25 MED ORDER — CYCLOBENZAPRINE HCL 10 MG PO TABS: 10.0000 mg | ORAL_TABLET | Freq: Three times a day (TID) | ORAL | 0 refills | Status: DC

## 2018-05-25 MED ORDER — TRAMADOL HCL 50 MG PO TABS
100.0000 mg | ORAL_TABLET | Freq: Once | ORAL | Status: AC
  Administered 2018-05-25: 100 mg via ORAL
  Filled 2018-05-25: qty 2

## 2018-05-25 NOTE — ED Triage Notes (Signed)
Patient complains of lower back pain since Monday.  Patient had been seen at [facility] on Monday, prescribed muscle "relaxers" after "negative xray".  Patient reports increased back pain since then with only minimum relief from prescribed medication.

## 2018-05-25 NOTE — Discharge Instructions (Signed)
Your urine test is negative for infection or acute blood.  Your CT scan is negative for kidney stone, or mass in the renal area.  The CT scan also shows your lumbar spine to be in good alignment.  There is no evidence of fracture or dislocation.  Please use a heating pad when at rest.  Please rest her back over the next couple of days.  Use Flexeril 3 times daily.  Please stop the Zanaflex for now

## 2018-05-25 NOTE — ED Provider Notes (Signed)
, Distracting her patient [facility] Provider Note   CSN: [id number] Arrival date & time: 05/25/18  1137    History   Chief Complaint Chief Complaint  Patient presents with  . Back Pain    HPI Nathan [name] is a 43 y.o. male.     Patient is a 43 year old male who presents to the emergency department with a complaint of lower back pain.  The patient states this problem started on April 27.  He says he he had an almost unbearable pain mostly on the right lower back.  [facility]  He was evaluated.  He was told that his x-ray was fine, and that this was probably a muscle related issue.  He was placed on a muscle relaxer.  The patient states that the muscle relaxer does not seem to be helping.  He says he has had muscle spasms in his back in the past.  But this 1 has lasted longer and was more intense.  The patient states that during the onset of this issue and up until now he has not lost control of bowel or bladder.  There is been no loss of sensation in the saddle area.  He has not had any recent injury or trauma that he is aware of.  The patient states that he sits for 12 to 15 hours at the time for his job.  He says now he cannot rest.  He has difficulty at work.  He would like to have additional evaluation and also is seeking some relief from his pain.  The history is provided by the patient.    Past Medical History:  Diagnosis Date  . Cardiac arrest (HCC)   . Chest pain   . Diabetes mellitus   . HLD (hyperlipidemia)   . Hypertension   . Obesity   . Pneumonia   . Syncope   . Thyroid disease   . TMJ (dislocation of temporomandibular joint)     Patient Active Problem List   Diagnosis Date Noted  . Personal history of noncompliance with medical treatment, presenting hazards to health 04/08/2016  . Normal coronary arteries 09/04/2015  . Type 2 diabetes mellitus with vascular disease (HCC) 12/12/2014  . Primary  hypothyroidism 11/03/2014  . Mixed hyperlipidemia 11/03/2014  . Chest pain   . HTN (hypertension)   . HLD (hyperlipidemia)     Past Surgical History:  Procedure Laterality Date  . CARDIAC CATHETERIZATION     no blockage  . HAND SURGERY          Home Medications    Prior to Admission medications   Medication Sig Start Date End Date Taking? Authorizing Provider  amLODipine (NORVASC) 10 MG tablet Take 1 tablet (10 mg total) by mouth daily. 02/28/17   Roma KayserNida, Gebreselassie W, MD  Insulin Degludec (TRESIBA FLEXTOUCH) 200 UNIT/ML SOPN Inject 160 Units into the skin daily. And pen needles, 1/day 01/09/18   Romero BellingEllison, Sean, MD  levothyroxine (SYNTHROID, LEVOTHROID) 75 MCG tablet Take 1 tablet (75 mcg total) by mouth daily before breakfast. 03/21/18   Romero BellingEllison, Sean, MD  naproxen (NAPROSYN) 500 MG tablet Take 1 tablet (500 mg total) by mouth 2 (two) times daily. 10/11/17   Bethann BerkshireZammit, Joseph, MD  Semaglutide,0.25 or 0.5MG /DOS, (OZEMPIC, 0.25 OR 0.5 MG/DOSE,) 2 MG/1.5ML SOPN Inject 0.25 mg into the skin once a week. 02/13/18   Romero BellingEllison, Sean, MD  Stann OreULTICARE INSULIN SYRINGE 29G X 1/2" 0.5 ML MISC USE TWICE A DAY WITH  INSULIN 09/04/17   Roma Kayser, MD    Family History Family History  Problem Relation Age of Onset  . Heart failure Mother   . Diabetes Mother   . Heart attack Father        x5   . Diabetes Father     Social History Social History   Tobacco Use  . Smoking status: Former Smoker    Years: 6.00    Types: Cigarettes    Last attempt to quit: 01/25/2008    Years since quitting: 10.3  . Smokeless tobacco: Never Used  Substance Use Topics  . Alcohol use: Not Currently    Alcohol/week: 4.0 standard drinks    Types: 4 Cans of beer per week    Comment: 3-4 beers weekly  . Drug use: No     Allergies   Benzocaine; Orange juice [orange oil]; and Grapefruit concentrate   Review of Systems Review of Systems  Constitutional: Negative for activity change.       All ROS Neg  except as noted in HPI  HENT: Negative for nosebleeds.   Eyes: Negative for photophobia and discharge.  Respiratory: Negative for cough, shortness of breath and wheezing.   Cardiovascular: Negative for chest pain and palpitations.  Gastrointestinal: Negative for abdominal pain and blood in stool.  Genitourinary: Positive for frequency. Negative for discharge, dysuria, hematuria, penile pain and penile swelling.  Musculoskeletal: Positive for back pain. Negative for arthralgias and neck pain.  Skin: Negative.   Neurological: Negative for dizziness, seizures and speech difficulty.  Psychiatric/Behavioral: Negative for confusion and hallucinations.     Physical Exam Updated Vital Signs BP (!) 187/115 (BP Location: Right Arm)   Pulse 92   Temp 98.5 F (36.9 C) (Oral)   Resp (!) 97   Ht  (1.803 m)   Wt 106.6 kg   SpO2 98%   BMI 32.78 kg/m   Physical Exam Vitals signs and nursing note reviewed.  Constitutional:      Appearance: He is well-developed. He is not toxic-appearing.  HENT:     Head: Normocephalic.     Right Ear: Tympanic membrane and external ear normal.     Left Ear: Tympanic membrane and external ear normal.  Eyes:     General: Lids are normal.     Pupils: Pupils are equal, round, and reactive to light.  Neck:     Musculoskeletal: Normal range of motion and neck supple.     Vascular: No carotid bruit.  Cardiovascular:     Rate and Rhythm: Normal rate and regular rhythm.     Pulses: Normal pulses.     Heart sounds: Normal heart sounds.  Pulmonary:     Effort: No respiratory distress.     Breath sounds: Normal breath sounds.  Abdominal:     General: Bowel sounds are normal.     Palpations: Abdomen is soft.     Tenderness: There is no abdominal tenderness. There is no guarding.  Musculoskeletal: Normal range of motion.     Lumbar back: He exhibits pain.       Back:     Comments: No palpable step-off of the cervical, thoracic, or lumbar spine.  There is  lower back pain on the right that is aggravated by tucking the chin.  No hot areas appreciated.  Lymphadenopathy:     Head:     Right side of head: No submandibular adenopathy.     Left side of head: No submandibular adenopathy.  Cervical: No cervical adenopathy.  Skin:    General: Skin is warm and dry.  Neurological:     Mental Status: He is alert and oriented to person, place, and time.     Cranial Nerves: No cranial nerve deficit.     Sensory: No sensory deficit.     Comments: Pt ambulatory without problem.  Psychiatric:        Speech: Speech normal.      ED Treatments / Results  Labs (all labs ordered are listed, but only abnormal results are displayed) Labs Reviewed - No data to display  EKG None  Radiology No results found.  Procedures Procedures (including critical care time)  Medications Ordered in ED Medications - No data to display   Initial Impression / Assessment and Plan / ED Course  I have reviewed the triage vital signs and the nursing notes.  Pertinent labs & imaging results that were available during my care of the patient were reviewed by me and considered in my medical decision making (see chart for details).          Final Clinical Impressions(s) / ED Diagnoses MDM  Vital signs reviewed.  Pulse oximetry is 96% on room air.  Within normal limits by my interpretation.  No recent injury or trauma to the back and flank area.  Patient states that he sits for long periods of time and he thinks that may have something to do with it but he is concerned about a kidney stone or urinary infection.  He has not had fever, chills, nausea, or vomiting.  There is been no blood in the urine but he is noted.  Urine analysis shows a specific gravity be slightly elevated at 1.031.  The glucose is elevated at greater than 500.  The remainder of the urine analysis is within normal limits.  CT stone study is negative for kidney stones.  The musculoskeletal  portion shows no bone lesions, and normal alignment of the lumbar spine.  There is no subluxations appreciated.  I have discussed the findings with the patient in terms which he understands.  The patient states he feels some better after pain medication.  Will stop the Zanaflex for now.  Will change the patient to Flexeril.  The patient will use Tylenol extra strength for mild pain, he will use Ultram for more severe pain.  Of asked the patient to use a heating pad to the area, and to see his primary physician for additional follow-up if not improving.  Patient is in agreement with this plan.   Final diagnoses:  Strain of lumbar region, initial encounter    ED Discharge Orders         Ordered    cyclobenzaprine (FLEXERIL) 10 MG tablet  3 times daily     05/25/18 1447    traMADol (ULTRAM) 50 MG tablet  Every 6 hours PRN     05/25/18 1447           Ivery Quale, PA-C 05/25/18 1642    Geoffery Lyons, MD 05/30/18 1236

## 2018-06-05 DIAGNOSIS — S134XXA Sprain of ligaments of cervical spine, initial encounter: Secondary | ICD-10-CM | POA: Diagnosis not present

## 2018-06-05 DIAGNOSIS — S233XXA Sprain of ligaments of thoracic spine, initial encounter: Secondary | ICD-10-CM | POA: Diagnosis not present

## 2018-06-05 DIAGNOSIS — S335XXA Sprain of ligaments of lumbar spine, initial encounter: Secondary | ICD-10-CM | POA: Diagnosis not present

## 2018-06-07 DIAGNOSIS — S134XXA Sprain of ligaments of cervical spine, initial encounter: Secondary | ICD-10-CM | POA: Diagnosis not present

## 2018-06-07 DIAGNOSIS — S233XXA Sprain of ligaments of thoracic spine, initial encounter: Secondary | ICD-10-CM | POA: Diagnosis not present

## 2018-06-07 DIAGNOSIS — S335XXA Sprain of ligaments of lumbar spine, initial encounter: Secondary | ICD-10-CM | POA: Diagnosis not present

## 2018-06-11 DIAGNOSIS — S233XXA Sprain of ligaments of thoracic spine, initial encounter: Secondary | ICD-10-CM | POA: Diagnosis not present

## 2018-06-11 DIAGNOSIS — S134XXA Sprain of ligaments of cervical spine, initial encounter: Secondary | ICD-10-CM | POA: Diagnosis not present

## 2018-06-11 DIAGNOSIS — S335XXA Sprain of ligaments of lumbar spine, initial encounter: Secondary | ICD-10-CM | POA: Diagnosis not present

## 2018-06-13 DIAGNOSIS — S335XXA Sprain of ligaments of lumbar spine, initial encounter: Secondary | ICD-10-CM | POA: Diagnosis not present

## 2018-06-13 DIAGNOSIS — S134XXA Sprain of ligaments of cervical spine, initial encounter: Secondary | ICD-10-CM | POA: Diagnosis not present

## 2018-06-13 DIAGNOSIS — S233XXA Sprain of ligaments of thoracic spine, initial encounter: Secondary | ICD-10-CM | POA: Diagnosis not present

## 2018-06-29 ENCOUNTER — Other Ambulatory Visit: Payer: Self-pay

## 2018-07-03 ENCOUNTER — Encounter: Payer: Self-pay | Admitting: Endocrinology

## 2018-07-03 ENCOUNTER — Ambulatory Visit (INDEPENDENT_AMBULATORY_CARE_PROVIDER_SITE_OTHER): Payer: BC Managed Care – PPO | Admitting: Endocrinology

## 2018-07-03 ENCOUNTER — Other Ambulatory Visit: Payer: Self-pay

## 2018-07-03 VITALS — BP 168/108 | HR 107 | Temp 98.2°F | Wt 232.0 lb

## 2018-07-03 DIAGNOSIS — E1152 Type 2 diabetes mellitus with diabetic peripheral angiopathy with gangrene: Secondary | ICD-10-CM | POA: Diagnosis not present

## 2018-07-03 DIAGNOSIS — E1159 Type 2 diabetes mellitus with other circulatory complications: Secondary | ICD-10-CM | POA: Diagnosis not present

## 2018-07-03 DIAGNOSIS — Z794 Long term (current) use of insulin: Secondary | ICD-10-CM | POA: Diagnosis not present

## 2018-07-03 DIAGNOSIS — E669 Obesity, unspecified: Secondary | ICD-10-CM | POA: Diagnosis not present

## 2018-07-03 LAB — POCT GLYCOSYLATED HEMOGLOBIN (HGB A1C): Hemoglobin A1C: 11.7 % — AB (ref 4.0–5.6)

## 2018-07-03 MED ORDER — INSULIN DEGLUDEC 200 UNIT/ML ~~LOC~~ SOPN: 180.0000 [IU] | PEN_INJECTOR | Freq: Every day | SUBCUTANEOUS | 11 refills | Status: DC

## 2018-07-03 MED ORDER — SEMAGLUTIDE(0.25 OR 0.5MG/DOS) 2 MG/1.5ML ~~LOC~~ SOPN: 0.5000 mg | PEN_INJECTOR | SUBCUTANEOUS | 3 refills | Status: DC

## 2018-07-03 NOTE — Patient Instructions (Addendum)
Your blood pressure is high today.  Please see your primary care provider soon, to have it rechecked.  Please check your blood sugar twice a day.  vary the time of day when you check, between before the 3 meals, and at bedtime.  also check if you have symptoms of your blood sugar being too high or too low.  please keep a record of the readings and bring it to your next appointment here (or you can bring the meter itself).  You can write it on any piece of paper.  please call us sooner if your blood sugar goes below 70, or if you have a lot of readings over 200. Please increase the Ozempic to 0.5 mg weekly, and the tresiba to 180 units daily.   Our goal is for the blood sugar to be in the low to mid-100's.  Please come back for a follow-up appointment in 1-2 months.

## 2018-07-03 NOTE — Progress Notes (Signed)
Subjective:    Patient ID: Nathan [name], male    DOB: April 21, 1975, 43 y.o.   MRN: [id number]  HPI Pt returns for f/u of diabetes mellitus:   DM type: Insulin-requiring type 2. Dx'ed: 2010 Complications: polyneuropathy.   Therapy: insulin since 2017 DKA: never Severe hypoglycemia: never.   Pancreatitis: never Pancreatic imaging: normal on 2012 CT.   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  Interval history: pt says he never misses medications.  no cbg record, but states cbg's vary from 205-250.  There is no trend throughout the day.  Pt says the insulin leaks out just minimally now.  pt states he feels well in general.   Past Medical History:  Diagnosis Date  . Cardiac arrest (HCC)   . Chest pain   . Diabetes mellitus   . HLD (hyperlipidemia)   . Hypertension   . Obesity   . Pneumonia   . Syncope   . Thyroid disease   . TMJ (dislocation of temporomandibular joint)     Past Surgical History:  Procedure Laterality Date  . CARDIAC CATHETERIZATION     no blockage  . HAND SURGERY      Social History   Socioeconomic History  . Marital status: Married    Spouse name: Not on file  . Number of children: Not on file  . Years of education: Not on file  . Highest education level: Not on file  Occupational History  . Not on file  Social Needs  . Financial resource strain: Not on file  . Food insecurity:    Worry: Not on file    Inability: Not on file  . Transportation needs:    Medical: Not on file    Non-medical: Not on file  Tobacco Use  . Smoking status: Former Smoker    Years: 6.00    Types: Cigarettes    Last attempt to quit: 01/25/2008    Years since quitting: 10.4  . Smokeless tobacco: Never Used  Substance and Sexual Activity  . Alcohol use: Not Currently    Alcohol/week: 4.0 standard drinks    Types: 4 Cans of beer per week    Comment: 3-4 beers weekly  . Drug use: No  . Sexual activity: Not on file   Lifestyle  . Physical activity:    Days per week: Not on file    Minutes per session: Not on file  . Stress: Not on file  Relationships  . Social connections:    Talks on phone: Not on file    Gets together: Not on file    Attends religious service: Not on file    Active member of club or organization: Not on file    Attends meetings of clubs or organizations: Not on file    Relationship status: Not on file  . Intimate partner violence:    Fear of current or ex partner: Not on file    Emotionally abused: Not on file    Physically abused: Not on file    Forced sexual activity: Not on file  Other Topics Concern  . Not on file  Social History Narrative  . Not on file    Current Outpatient Medications on File Prior to Visit  Medication Sig Dispense Refill  . amLODipine (NORVASC) 10 MG tablet Take 1 tablet (10 mg total) by mouth daily. 30 tablet 3  . cyclobenzaprine (FLEXERIL) 10 MG tablet Take 1 tablet (10 mg total) by mouth 3 (  three) times daily. 20 tablet 0  . levothyroxine (SYNTHROID, LEVOTHROID) 75 MCG tablet Take 1 tablet (75 mcg total) by mouth daily before breakfast. 30 tablet 2  . naproxen (NAPROSYN) 500 MG tablet Take 1 tablet (500 mg total) by mouth 2 (two) times daily. 30 tablet 0  . tiZANidine (ZANAFLEX) 4 MG tablet Take 1 tablet by mouth every 6 (six) hours as needed.    . traMADol (ULTRAM) 50 MG tablet Take 1 tablet (50 mg total) by mouth every 6 (six) hours as needed. 12 tablet 0  . ULTICARE INSULIN SYRINGE 29G X 1/2" 0.5 ML MISC USE TWICE A DAY WITH INSULIN 100 each 3   No current facility-administered medications on file prior to visit.     Allergies  Allergen Reactions  . Benzocaine Anaphylaxis and Other (See Comments)    REACTION: Blistering all over and throat swells shut  . Orange Juice [Orange Oil] Anaphylaxis  . Grapefruit Concentrate     Family History  Problem Relation Age of Onset  . Heart failure Mother   . Diabetes Mother   . Heart attack  Father        x5   . Diabetes Father    BP (!) 168/108 (BP Location: Left Arm, Patient Position: Sitting, Cuff Size: Normal)   Pulse (!) 107   Temp 98.2 F (36.8 C) (Oral)   Wt 232 lb (105.2 kg)   SpO2 95%   BMI 32.36 kg/m    Review of Systems He denies hypoglycemia and nausea.  No weight change    Objective:   Physical Exam VITAL SIGNS:  See vs page GENERAL: no distress Pulses: dorsalis pedis intact bilat.   MSK: no deformity of the feet CV: no leg edema.   Skin:  no ulcer on the feet.  normal color and temp on the feet.   Neuro: sensation is intact to touch on the feet.    Lab Results  Component Value Date   HGBA1C 11.7 (A) 07/03/2018      Assessment & Plan:  HTN: is noted today.  Insulin-requiring type 2 DM, with PN: he needs increased rx.  Obesity, unchanged.  We discussed.  We'll increase Ozempic.   Patient Instructions  Your blood pressure is high today.  Please see your primary care provider soon, to have it rechecked.  Please check your blood sugar twice a day.  vary the time of day when you check, between before the 3 meals, and at bedtime.  also check if you have symptoms of your blood sugar being too high or too low.  please keep a record of the readings and bring it to your next appointment here (or you can bring the meter itself).  You can write it on any piece of paper.  please call us sooner if your blood sugar goes below 70, or if you have a lot of readings over 200. Please increase the Ozempic to 0.5 mg weekly, and the tresiba to 180 units daily.   Our goal is for the blood sugar to be in the low to mid-100's.  Please come back for a follow-up appointment in 1-2 months.

## 2018-09-11 DIAGNOSIS — R509 Fever, unspecified: Secondary | ICD-10-CM | POA: Diagnosis not present

## 2018-09-11 DIAGNOSIS — R51 Headache: Secondary | ICD-10-CM | POA: Diagnosis not present

## 2018-09-11 DIAGNOSIS — Z888 Allergy status to other drugs, medicaments and biological substances status: Secondary | ICD-10-CM | POA: Diagnosis not present

## 2018-09-11 DIAGNOSIS — F17200 Nicotine dependence, unspecified, uncomplicated: Secondary | ICD-10-CM | POA: Diagnosis not present

## 2018-09-11 DIAGNOSIS — Z79899 Other long term (current) drug therapy: Secondary | ICD-10-CM | POA: Diagnosis not present

## 2018-09-11 DIAGNOSIS — R438 Other disturbances of smell and taste: Secondary | ICD-10-CM | POA: Diagnosis not present

## 2018-09-11 DIAGNOSIS — E119 Type 2 diabetes mellitus without complications: Secondary | ICD-10-CM | POA: Diagnosis not present

## 2018-09-11 DIAGNOSIS — Z91018 Allergy to other foods: Secondary | ICD-10-CM | POA: Diagnosis not present

## 2018-09-11 DIAGNOSIS — Z794 Long term (current) use of insulin: Secondary | ICD-10-CM | POA: Diagnosis not present

## 2018-09-11 DIAGNOSIS — R110 Nausea: Secondary | ICD-10-CM | POA: Diagnosis not present

## 2018-09-11 DIAGNOSIS — I10 Essential (primary) hypertension: Secondary | ICD-10-CM | POA: Diagnosis not present

## 2018-09-11 DIAGNOSIS — E039 Hypothyroidism, unspecified: Secondary | ICD-10-CM | POA: Diagnosis not present

## 2018-09-11 DIAGNOSIS — Z20828 Contact with and (suspected) exposure to other viral communicable diseases: Secondary | ICD-10-CM | POA: Diagnosis not present

## 2018-09-11 DIAGNOSIS — R531 Weakness: Secondary | ICD-10-CM | POA: Diagnosis not present

## 2018-09-13 DIAGNOSIS — B349 Viral infection, unspecified: Secondary | ICD-10-CM | POA: Diagnosis not present

## 2018-09-18 ENCOUNTER — Ambulatory Visit: Payer: BC Managed Care – PPO | Admitting: Endocrinology

## 2018-10-03 ENCOUNTER — Ambulatory Visit: Payer: BC Managed Care – PPO | Admitting: Endocrinology

## 2018-10-05 ENCOUNTER — Other Ambulatory Visit: Payer: Self-pay | Admitting: Endocrinology

## 2018-10-05 NOTE — Telephone Encounter (Signed)
LOV 07/03/18. Per your office note, pt was advised to f/u 1-2 months. Canceled 09/18/18 and 10/03/18. No future appt noted. Please advise.

## 2018-10-05 NOTE — Telephone Encounter (Signed)
Please refill x 1 F/u is due  

## 2018-10-10 ENCOUNTER — Encounter: Payer: Self-pay | Admitting: Endocrinology

## 2018-10-10 ENCOUNTER — Other Ambulatory Visit: Payer: Self-pay

## 2018-10-10 ENCOUNTER — Ambulatory Visit (INDEPENDENT_AMBULATORY_CARE_PROVIDER_SITE_OTHER): Payer: BC Managed Care – PPO | Admitting: Endocrinology

## 2018-10-10 VITALS — BP 166/102 | HR 98 | Ht 71.0 in | Wt 230.0 lb

## 2018-10-10 DIAGNOSIS — E1159 Type 2 diabetes mellitus with other circulatory complications: Secondary | ICD-10-CM | POA: Diagnosis not present

## 2018-10-10 LAB — POCT GLYCOSYLATED HEMOGLOBIN (HGB A1C): Hemoglobin A1C: 11.4 % — AB (ref 4.0–5.6)

## 2018-10-10 MED ORDER — TRESIBA FLEXTOUCH 200 UNIT/ML ~~LOC~~ SOPN: 180.0000 [IU] | PEN_INJECTOR | Freq: Every day | SUBCUTANEOUS | 3 refills | Status: DC

## 2018-10-10 MED ORDER — OZEMPIC (0.25 OR 0.5 MG/DOSE) 2 MG/1.5ML ~~LOC~~ SOPN: 0.5000 mg | PEN_INJECTOR | SUBCUTANEOUS | 3 refills | Status: DC

## 2018-10-10 NOTE — Patient Instructions (Addendum)
Your blood pressure is high today.  Please see your primary care provider soon, to have it rechecked.  Please check your blood sugar twice a day.  vary the time of day when you check, between before the 3 meals, and at bedtime.  also check if you have symptoms of your blood sugar being too high or too low.  please keep a record of the readings and bring it to your next appointment here (or you can bring the meter itself).  You can write it on any piece of paper.  please call us sooner if your blood sugar goes below 70, or if you have a lot of readings over 200. Please increase the Ozempic to 0.5 mg weekly, and the tresiba to 180 units daily.   Our goal is for the blood sugar to be in the low to mid-100's.  Please let me know if your foot continues to hurt.  I would be happy to refer you to a foot specialist Please come back for a follow-up appointment in 1-2 months.

## 2018-10-10 NOTE — Progress Notes (Signed)
Subjective:    Patient ID: Nathan [name], male    DOB: 02/27/1975, 43 y.o.   MRN: [id number]  HPI Pt returns for f/u of diabetes mellitus:   DM type: Insulin-requiring type 2. Dx'ed: 2010 Complications: polyneuropathy.   Therapy: insulin since 2017 DKA: never Severe hypoglycemia: never.   Pancreatitis: never Pancreatic imaging: normal on 2012 CT.   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   Interval history: pt says he last took his insulin 6 days ago.  no cbg record, but states cbg's vary from 185-230.  He did not make the increases advised at the last ov.  pt states he feels well in general.   Past Medical History:  Diagnosis Date  . Cardiac arrest (HCC)   . Chest pain   . Diabetes mellitus   . HLD (hyperlipidemia)   . Hypertension   . Obesity   . Pneumonia   . Syncope   . Thyroid disease   . TMJ (dislocation of temporomandibular joint)     Past Surgical History:  Procedure Laterality Date  . CARDIAC CATHETERIZATION     no blockage  . HAND SURGERY      Social History   Socioeconomic History  . Marital status: Married    Spouse name: Not on file  . Number of children: Not on file  . Years of education: Not on file  . Highest education level: Not on file  Occupational History  . Not on file  Social Needs  . Financial resource strain: Not on file  . Food insecurity    Worry: Not on file    Inability: Not on file  . Transportation needs    Medical: Not on file    Non-medical: Not on file  Tobacco Use  . Smoking status: Former Smoker    Years: 6.00    Types: Cigarettes    Quit date: 01/25/2008    Years since quitting: 10.7  . Smokeless tobacco: Never Used  Substance and Sexual Activity  . Alcohol use: Not Currently    Alcohol/week: 4.0 standard drinks    Types: 4 Cans of beer per week    Comment: 3-4 beers weekly  . Drug use: No  . Sexual activity: Not on file  Lifestyle  . Physical activity    Days per  week: Not on file    Minutes per session: Not on file  . Stress: Not on file  Relationships  . Social Musicianconnections    Talks on phone: Not on file    Gets together: Not on file    Attends religious service: Not on file    Active member of club or organization: Not on file    Attends meetings of clubs or organizations: Not on file    Relationship status: Not on file  . Intimate partner violence    Fear of current or ex partner: Not on file    Emotionally abused: Not on file    Physically abused: Not on file    Forced sexual activity: Not on file  Other Topics Concern  . Not on file  Social History Narrative  . Not on file    Current Outpatient Medications on File Prior to Visit  Medication Sig Dispense Refill  . amLODipine (NORVASC) 10 MG tablet Take 1 tablet (10 mg total) by mouth daily. 30 tablet 3  . cyclobenzaprine (FLEXERIL) 10 MG tablet Take 1 tablet (10 mg total) by mouth 3 (three) times daily.  20 tablet 0  . levothyroxine (SYNTHROID, LEVOTHROID) 75 MCG tablet Take 1 tablet (75 mcg total) by mouth daily before breakfast. 30 tablet 2  . naproxen (NAPROSYN) 500 MG tablet Take 1 tablet (500 mg total) by mouth 2 (two) times daily. 30 tablet 0  . tiZANidine (ZANAFLEX) 4 MG tablet Take 1 tablet by mouth every 6 (six) hours as needed.    . traMADol (ULTRAM) 50 MG tablet Take 1 tablet (50 mg total) by mouth every 6 (six) hours as needed. 12 tablet 0  . ULTICARE INSULIN SYRINGE 29G X 1/2" 0.5 ML MISC USE TWICE A DAY WITH INSULIN 100 each 3   No current facility-administered medications on file prior to visit.     Allergies  Allergen Reactions  . Benzocaine Anaphylaxis and Other (See Comments)    REACTION: Blistering all over and throat swells shut  . Orange Juice [Orange Oil] Anaphylaxis  . Grapefruit Concentrate     Family History  Problem Relation Age of Onset  . Heart failure Mother   . Diabetes Mother   . Heart attack Father        x5   . Diabetes Father     BP (!)  166/102 (BP Location: Left Arm, Patient Position: Sitting, Cuff Size: Large)   Pulse 98   Ht 5\' 11"  (1.803 m)   Wt 230 lb (104.3 kg)   SpO2 96%   BMI 32.08 kg/m    Review of Systems He has slight pain at the plantar aspect of the right foot.      Objective:   Physical Exam VITAL SIGNS:  See vs page GENERAL: no distress. Pulses: dorsalis pedis intact bilat.   MSK: no deformity of the feet.  There is a few cm nodule at the mid-plantar aspect of the right foot.  No swell/erythema CV: no leg edema Skin:  no ulcer on the feet.  normal color and temp on the feet. Neuro: sensation is intact to touch on the feet.    Lab Results  Component Value Date   HGBA1C 11.4 (A) 10/10/2018   Lab Results  Component Value Date   TSH 2.20 02/13/2018       Assessment & Plan:  Insulin-requiring type 2 DM, with PN: he needs increased rx Noncompliance with insulin and cbg recording. He is not a candidate for multiple daily injections HTN: is noted today.   Painful foot nodule: new.  Patient Instructions  Your blood pressure is high today.  Please see your primary care provider soon, to have it rechecked.  Please check your blood sugar twice a day.  vary the time of day when you check, between before the 3 meals, and at bedtime.  also check if you have symptoms of your blood sugar being too high or too low.  please keep a record of the readings and bring it to your next appointment here (or you can bring the meter itself).  You can write it on any piece of paper.  please call us sooner if your blood sugar goes below 70, or if you have a lot of readings over 200. Please increase the Ozempic to 0.5 mg weekly, and the tresiba to 180 units daily.   Our goal is for the blood sugar to be in the low to mid-100's.  Please let me know if your foot continues to hurt.  I would be happy to refer you to a foot specialist Please come back for a follow-up appointment in 1-2 months.

## 2018-11-26 ENCOUNTER — Other Ambulatory Visit: Payer: Self-pay

## 2018-11-28 ENCOUNTER — Ambulatory Visit: Payer: BC Managed Care – PPO | Admitting: Endocrinology

## 2018-12-31 ENCOUNTER — Encounter: Payer: Self-pay | Admitting: Endocrinology

## 2018-12-31 ENCOUNTER — Other Ambulatory Visit: Payer: Self-pay | Admitting: Endocrinology

## 2018-12-31 ENCOUNTER — Other Ambulatory Visit: Payer: Self-pay

## 2018-12-31 ENCOUNTER — Ambulatory Visit (INDEPENDENT_AMBULATORY_CARE_PROVIDER_SITE_OTHER): Payer: BC Managed Care – PPO | Admitting: Endocrinology

## 2018-12-31 VITALS — BP 148/88 | HR 95 | Ht 71.0 in | Wt 236.2 lb

## 2018-12-31 DIAGNOSIS — E1140 Type 2 diabetes mellitus with diabetic neuropathy, unspecified: Secondary | ICD-10-CM | POA: Diagnosis not present

## 2018-12-31 DIAGNOSIS — E1159 Type 2 diabetes mellitus with other circulatory complications: Secondary | ICD-10-CM | POA: Diagnosis not present

## 2018-12-31 LAB — POCT GLYCOSYLATED HEMOGLOBIN (HGB A1C): Hemoglobin A1C: 10.8 % — AB (ref 4.0–5.6)

## 2018-12-31 MED ORDER — OZEMPIC (1 MG/DOSE) 2 MG/1.5ML ~~LOC~~ SOPN: 1.0000 mg | PEN_INJECTOR | SUBCUTANEOUS | 11 refills | Status: DC

## 2018-12-31 NOTE — Patient Instructions (Addendum)
Your blood pressure is high today.  Please see your primary care provider soon, to have it rechecked.  Please check your blood sugar twice a day.  vary the time of day when you check, between before the 3 meals, and at bedtime.  also check if you have symptoms of your blood sugar being too high or too low.  please keep a record of the readings and bring it to your next appointment here (or you can bring the meter itself).  You can write it on any piece of paper.  please call us sooner if your blood sugar goes below 70, or if you have a lot of readings over 200.  Please increase the Ozempic to 1.0 mg weekly, and the [place] to 180 units daily.   Our goal is for the blood sugar to be in the low to mid-100's. Please call if this does not happen by next week.   Please come back for a follow-up appointment in 2 months.

## 2018-12-31 NOTE — Progress Notes (Signed)
Subjective:    Patient ID: Nathan [name], male    DOB: 1975/05/18, 43 y.o.   MRN: [id number]  HPI Pt returns for f/u of diabetes mellitus:   DM type: Insulin-requiring type 2. Dx'ed: 3532 Complications: polyneuropathy.   Therapy: insulin since 2017 DKA: never Severe hypoglycemia: never.   Pancreatitis: never Pancreatic imaging: normal on 2012 CT.   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   Interval history: pt says he takes insulin as rx'ed, but he takes just 160 units/d.  He takes Ozempic 0.5/d.  no cbg record, but states cbg's vary from 180-250.  pt states he feels well in general.   Past Medical History:  Diagnosis Date  . Cardiac arrest ([place])   . Chest pain   . Diabetes mellitus   . HLD (hyperlipidemia)   . Hypertension   . Obesity   . Pneumonia   . Syncope   . Thyroid disease   . TMJ (dislocation of temporomandibular joint)     Past Surgical History:  Procedure Laterality Date  . CARDIAC CATHETERIZATION     no blockage  . HAND SURGERY      Social History   Socioeconomic History  . Marital status: Married    Spouse name: Not on file  . Number of children: Not on file  . Years of education: Not on file  . Highest education level: Not on file  Occupational History  . Not on file  Social Needs  . Financial resource strain: Not on file  . Food insecurity    Worry: Not on file    Inability: Not on file  . Transportation needs    Medical: Not on file    Non-medical: Not on file  Tobacco Use  . Smoking status: Former Smoker    Years: 6.00    Types: Cigarettes    Quit date: 01/25/2008    Years since quitting: 10.9  . Smokeless tobacco: Never Used  Substance and Sexual Activity  . Alcohol use: Not Currently    Alcohol/week: 4.0 standard drinks    Types: 4 Cans of beer per week    Comment: 3-4 beers weekly  . Drug use: No  . Sexual activity: Not on file  Lifestyle  . Physical activity    Days per week: Not  on file    Minutes per session: Not on file  . Stress: Not on file  Relationships  . Social Herbalist on phone: Not on file    Gets together: Not on file    Attends religious service: Not on file    Active member of club or organization: Not on file    Attends meetings of clubs or organizations: Not on file    Relationship status: Not on file  . Intimate partner violence    Fear of current or ex partner: Not on file    Emotionally abused: Not on file    Physically abused: Not on file    Forced sexual activity: Not on file  Other Topics Concern  . Not on file  Social History Narrative  . Not on file    Current Outpatient Medications on File Prior to Visit  Medication Sig Dispense Refill  . amLODipine (NORVASC) 10 MG tablet Take 1 tablet (10 mg total) by mouth daily. 30 tablet 3  . cyclobenzaprine (FLEXERIL) 10 MG tablet Take 1 tablet (10 mg total) by mouth 3 (three) times daily. 20 tablet 0  .  Insulin Degludec (TRESIBA FLEXTOUCH) 200 UNIT/ML SOPN Inject 180 Units into the skin daily. And pen needle 1/day 27 pen 3  . levothyroxine (SYNTHROID, LEVOTHROID) 75 MCG tablet Take 1 tablet (75 mcg total) by mouth daily before breakfast. 30 tablet 2  . naproxen (NAPROSYN) 500 MG tablet Take 1 tablet (500 mg total) by mouth 2 (two) times daily. 30 tablet 0  . tiZANidine (ZANAFLEX) 4 MG tablet Take 1 tablet by mouth every 6 (six) hours as needed.    . traMADol (ULTRAM) 50 MG tablet Take 1 tablet (50 mg total) by mouth every 6 (six) hours as needed. 12 tablet 0  . ULTICARE INSULIN SYRINGE 29G X 1/2" 0.5 ML MISC USE TWICE A DAY WITH INSULIN 100 each 3   No current facility-administered medications on file prior to visit.     Allergies  Allergen Reactions  . Benzocaine Anaphylaxis and Other (See Comments)    REACTION: Blistering all over and throat swells shut  . Orange Juice [Orange Oil] Anaphylaxis  . Grapefruit Concentrate     Family History  Problem Relation Age of Onset   . Heart failure Mother   . Diabetes Mother   . Heart attack Father        x5   . Diabetes Father     BP (!) 148/88 (BP Location: Left Arm, Patient Position: Sitting, Cuff Size: Large)   Pulse 95   Ht 5\' 11"  (1.803 m)   Wt 236 lb 3.2 oz (107.1 kg)   SpO2 98%   BMI 32.94 kg/m    Review of Systems He denies hypoglycemia.      Objective:   Physical Exam VITAL SIGNS:  See vs page GENERAL: no distress Pulses: dorsalis pedis intact bilat.   MSK: no deformity of the feet CV: no leg edema Skin:  no ulcer on the feet.  normal color and temp on the feet. Neuro: sensation is intact to touch on the feet.    Lab Results  Component Value Date   HGBA1C 10.8 (A) 12/31/2018       Assessment & Plan:  HTN: is noted today Insulin-requiring type 2 DM, with PN: he needs increased rx.   Obesity: this is considered in rx decisions.  Patient Instructions  Your blood pressure is high today.  Please see your primary care provider soon, to have it rechecked.  Please check your blood sugar twice a day.  vary the time of day when you check, between before the 3 meals, and at bedtime.  also check if you have symptoms of your blood sugar being too high or too low.  please keep a record of the readings and bring it to your next appointment here (or you can bring the meter itself).  You can write it on any piece of paper.  please call 14/07/2018 sooner if your blood sugar goes below 70, or if you have a lot of readings over 200.  Please increase the Ozempic to 1.0 mg weekly, and the [place] to 180 units daily.   Our goal is for the blood sugar to be in the low to mid-100's. Please call if this does not happen by next week.   Please come back for a follow-up appointment in 2 months.

## 2019-03-01 ENCOUNTER — Other Ambulatory Visit: Payer: Self-pay

## 2019-03-05 ENCOUNTER — Ambulatory Visit: Payer: BC Managed Care – PPO | Admitting: Endocrinology

## 2019-03-13 ENCOUNTER — Ambulatory Visit: Payer: BC Managed Care – PPO | Admitting: Endocrinology

## 2019-03-13 DIAGNOSIS — Z0289 Encounter for other administrative examinations: Secondary | ICD-10-CM

## 2019-03-29 ENCOUNTER — Other Ambulatory Visit: Payer: Self-pay

## 2019-04-02 ENCOUNTER — Ambulatory Visit (INDEPENDENT_AMBULATORY_CARE_PROVIDER_SITE_OTHER): Payer: BC Managed Care – PPO | Admitting: Endocrinology

## 2019-04-02 ENCOUNTER — Other Ambulatory Visit: Payer: Self-pay

## 2019-04-02 ENCOUNTER — Encounter: Payer: Self-pay | Admitting: Endocrinology

## 2019-04-02 VITALS — BP 174/110 | HR 99 | Ht 71.0 in | Wt 229.2 lb

## 2019-04-02 DIAGNOSIS — E1159 Type 2 diabetes mellitus with other circulatory complications: Secondary | ICD-10-CM

## 2019-04-02 LAB — POCT GLYCOSYLATED HEMOGLOBIN (HGB A1C): Hemoglobin A1C: 10.2 % — AB (ref 4.0–5.6)

## 2019-04-02 MED ORDER — CANAGLIFLOZIN 300 MG PO TABS: 300.0000 mg | ORAL_TABLET | Freq: Every day | ORAL | 3 refills | Status: DC

## 2019-04-02 MED ORDER — TRESIBA FLEXTOUCH 200 UNIT/ML ~~LOC~~ SOPN: 200.0000 [IU] | PEN_INJECTOR | Freq: Every day | SUBCUTANEOUS | 3 refills | Status: DC

## 2019-04-02 NOTE — Progress Notes (Signed)
Subjective:    Patient ID: Nathan [name], male    DOB: Jun 15, 1975, 44 y.o.   MRN: [id number]  HPI Pt returns for f/u of diabetes mellitus:   DM type: Insulin-requiring type 2. Dx'ed: 2010 Complications: polyneuropathy.   Therapy: insulin since 2017 DKA: never Severe hypoglycemia: never.   Pancreatitis: never Pancreatic imaging: normal on 2012 CT.   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   Interval history: pt says he takes insulin and Ozempic as rx'ed.  no cbg record, but states cbg's vary from 190-240.  pt states he feels well in general.  Past Medical History:  Diagnosis Date  . Cardiac arrest (HCC)   . Chest pain   . Diabetes mellitus   . HLD (hyperlipidemia)   . Hypertension   . Obesity   . Pneumonia   . Syncope   . Thyroid disease   . TMJ (dislocation of temporomandibular joint)     Past Surgical History:  Procedure Laterality Date  . CARDIAC CATHETERIZATION     no blockage  . HAND SURGERY      Social History   Socioeconomic History  . Marital status: Married    Spouse name: Not on file  . Number of children: Not on file  . Years of education: Not on file  . Highest education level: Not on file  Occupational History  . Not on file  Tobacco Use  . Smoking status: Former Smoker    Years: 6.00    Types: Cigarettes    Quit date: 01/25/2008    Years since quitting: 11.2  . Smokeless tobacco: Never Used  Substance and Sexual Activity  . Alcohol use: Not Currently    Alcohol/week: 4.0 standard drinks    Types: 4 Cans of beer per week    Comment: 3-4 beers weekly  . Drug use: No  . Sexual activity: Not on file  Other Topics Concern  . Not on file  Social History Narrative  . Not on file   Social Determinants of Health   Financial Resource Strain:   . Difficulty of Paying Living Expenses:   Food Insecurity:   . Worried About Programme researcher, broadcasting/film/video in the Last Year:   . Barista in the Last  Year:   Transportation Needs:   . Freight forwarder (Medical):   Marland Kitchen Lack of Transportation (Non-Medical):   Physical Activity:   . Days of Exercise per Week:   . Minutes of Exercise per Session:   Stress:   . Feeling of Stress :   Social Connections:   . Frequency of Communication with Friends and Family:   . Frequency of Social Gatherings with Friends and Family:   . Attends Religious Services:   . Active Member of Clubs or Organizations:   . Attends Banker Meetings:   Marland Kitchen Marital Status:   Intimate Partner Violence:   . Fear of Current or Ex-Partner:   . Emotionally Abused:   Marland Kitchen Physically Abused:   . Sexually Abused:     Current Outpatient Medications on File Prior to Visit  Medication Sig Dispense Refill  . amLODipine (NORVASC) 10 MG tablet Take 1 tablet (10 mg total) by mouth daily. 30 tablet 3  . cyclobenzaprine (FLEXERIL) 10 MG tablet Take 1 tablet (10 mg total) by mouth 3 (three) times daily. 20 tablet 0  . levothyroxine (SYNTHROID, LEVOTHROID) 75 MCG tablet Take 1 tablet (75 mcg total) by mouth  daily before breakfast. 30 tablet 2  . naproxen (NAPROSYN) 500 MG tablet Take 1 tablet (500 mg total) by mouth 2 (two) times daily. 30 tablet 0  . RELION PEN NEEDLES 31G X 6 MM MISC USE  ONCE DAILY 50 each 0  . Semaglutide, 1 MG/DOSE, (OZEMPIC, 1 MG/DOSE,) 2 MG/1.5ML SOPN Inject 1 mg into the skin once a week. 2 pen 11  . tiZANidine (ZANAFLEX) 4 MG tablet Take 1 tablet by mouth every 6 (six) hours as needed.    . traMADol (ULTRAM) 50 MG tablet Take 1 tablet (50 mg total) by mouth every 6 (six) hours as needed. 12 tablet 0   No current facility-administered medications on file prior to visit.    Allergies  Allergen Reactions  . Benzocaine Anaphylaxis and Other (See Comments)    REACTION: Blistering all over and throat swells shut  . Orange Juice [Orange Oil] Anaphylaxis  . Grapefruit Concentrate     Family History  Problem Relation Age of Onset  . Heart  failure Mother   . Diabetes Mother   . Heart attack Father        x5   . Diabetes Father     BP (!) 174/110 (BP Location: Left Arm, Patient Position: Sitting, Cuff Size: Large)   Pulse 99   Ht 5\' 11"  (1.803 m)   Wt 229 lb 3.2 oz (104 kg)   SpO2 99%   BMI 31.97 kg/m    Review of Systems He denies hypoglycemia.     Objective:   Physical Exam VITAL SIGNS:  See vs page GENERAL: no distress Skin: seborrhea on the face.  Pulses: dorsalis pedis intact bilat.   MSK: no deformity of the feet CV: no leg edema Skin:  no ulcer on the feet.  normal color and temp on the feet.  Neuro: sensation is intact to touch on the feet.    Lab Results  Component Value Date   CREATININE 0.75 02/13/2018   BUN 11 02/13/2018   NA 134 (L) 02/13/2018   K 4.0 02/13/2018   CL 98 02/13/2018   CO2 28 02/13/2018     Lab Results  Component Value Date   HGBA1C 10.2 (A) 04/02/2019   Lab Results  Component Value Date   TSH 2.20 02/13/2018       Assessment & Plan:  HTN: is noted today Insulin-requiring type 2 DM: he needs increased rx. Obesity: Invokana might help  Patient Instructions  Your blood pressure is high today.  Please see your primary care provider soon, to have it rechecked.  Please check your blood sugar twice a day.  vary the time of day when you check, between before the 3 meals, and at bedtime.  also check if you have symptoms of your blood sugar being too high or too low.  please keep a record of the readings and bring it to your next appointment here (or you can bring the meter itself).  You can write it on any piece of paper.  please call us sooner if your blood sugar goes below 70, or if you have a lot of readings over 200.  Please increase the Tresiba to 200 units daily, and: I have sent a prescription to your pharmacy, to add "Invokana." Our goal is for the blood sugar to be in the low to mid-100's. Please call if this does not happen by next week.   Please come back for a  follow-up appointment in 2 months.

## 2019-04-02 NOTE — Patient Instructions (Addendum)
Your blood pressure is high today.  Please see your primary care provider soon, to have it rechecked.  Please check your blood sugar twice a day.  vary the time of day when you check, between before the 3 meals, and at bedtime.  also check if you have symptoms of your blood sugar being too high or too low.  please keep a record of the readings and bring it to your next appointment here (or you can bring the meter itself).  You can write it on any piece of paper.  please call us sooner if your blood sugar goes below 70, or if you have a lot of readings over 200.  Please increase the Tresiba to 200 units daily, and: I have sent a prescription to your pharmacy, to add "Invokana." Our goal is for the blood sugar to be in the low to mid-100's. Please call if this does not happen by next week.   Please come back for a follow-up appointment in 2 months.

## 2019-05-01 ENCOUNTER — Other Ambulatory Visit: Payer: Self-pay | Admitting: Endocrinology

## 2019-05-01 DIAGNOSIS — E039 Hypothyroidism, unspecified: Secondary | ICD-10-CM

## 2019-05-15 DIAGNOSIS — Z1389 Encounter for screening for other disorder: Secondary | ICD-10-CM | POA: Diagnosis not present

## 2019-05-15 DIAGNOSIS — Z6833 Body mass index (BMI) 33.0-33.9, adult: Secondary | ICD-10-CM | POA: Diagnosis not present

## 2019-05-15 DIAGNOSIS — R0681 Apnea, not elsewhere classified: Secondary | ICD-10-CM | POA: Diagnosis not present

## 2019-05-15 DIAGNOSIS — M545 Low back pain: Secondary | ICD-10-CM | POA: Diagnosis not present

## 2019-05-15 DIAGNOSIS — E119 Type 2 diabetes mellitus without complications: Secondary | ICD-10-CM | POA: Diagnosis not present

## 2019-05-15 DIAGNOSIS — R002 Palpitations: Secondary | ICD-10-CM | POA: Diagnosis not present

## 2019-05-15 DIAGNOSIS — N951 Menopausal and female climacteric states: Secondary | ICD-10-CM | POA: Diagnosis not present

## 2019-05-17 DIAGNOSIS — Z1389 Encounter for screening for other disorder: Secondary | ICD-10-CM | POA: Diagnosis not present

## 2019-05-17 DIAGNOSIS — R002 Palpitations: Secondary | ICD-10-CM | POA: Diagnosis not present

## 2019-06-03 ENCOUNTER — Ambulatory Visit: Payer: BC Managed Care – PPO | Admitting: Endocrinology

## 2019-06-04 ENCOUNTER — Ambulatory Visit: Payer: BC Managed Care – PPO | Admitting: Endocrinology

## 2019-06-06 DIAGNOSIS — M1990 Unspecified osteoarthritis, unspecified site: Secondary | ICD-10-CM | POA: Diagnosis not present

## 2019-06-06 DIAGNOSIS — F17200 Nicotine dependence, unspecified, uncomplicated: Secondary | ICD-10-CM | POA: Diagnosis not present

## 2019-06-06 DIAGNOSIS — Z888 Allergy status to other drugs, medicaments and biological substances status: Secondary | ICD-10-CM | POA: Diagnosis not present

## 2019-06-06 DIAGNOSIS — M79641 Pain in right hand: Secondary | ICD-10-CM | POA: Diagnosis not present

## 2019-06-06 DIAGNOSIS — M19041 Primary osteoarthritis, right hand: Secondary | ICD-10-CM | POA: Diagnosis not present

## 2019-06-06 DIAGNOSIS — Z79899 Other long term (current) drug therapy: Secondary | ICD-10-CM | POA: Diagnosis not present

## 2019-06-06 DIAGNOSIS — M79642 Pain in left hand: Secondary | ICD-10-CM | POA: Diagnosis not present

## 2019-06-06 DIAGNOSIS — E039 Hypothyroidism, unspecified: Secondary | ICD-10-CM | POA: Diagnosis not present

## 2019-06-06 DIAGNOSIS — E119 Type 2 diabetes mellitus without complications: Secondary | ICD-10-CM | POA: Diagnosis not present

## 2019-06-06 DIAGNOSIS — Z794 Long term (current) use of insulin: Secondary | ICD-10-CM | POA: Diagnosis not present

## 2019-06-06 DIAGNOSIS — I10 Essential (primary) hypertension: Secondary | ICD-10-CM | POA: Diagnosis not present

## 2019-06-11 ENCOUNTER — Telehealth: Payer: Self-pay | Admitting: Endocrinology

## 2019-06-11 ENCOUNTER — Other Ambulatory Visit: Payer: Self-pay

## 2019-06-11 ENCOUNTER — Ambulatory Visit: Payer: BC Managed Care – PPO | Admitting: Endocrinology

## 2019-06-11 DIAGNOSIS — E1159 Type 2 diabetes mellitus with other circulatory complications: Secondary | ICD-10-CM

## 2019-06-11 MED ORDER — OZEMPIC (1 MG/DOSE) 2 MG/1.5ML ~~LOC~~ SOPN: 1.0000 mg | PEN_INJECTOR | SUBCUTANEOUS | 0 refills | Status: DC

## 2019-06-11 MED ORDER — TRESIBA FLEXTOUCH 200 UNIT/ML ~~LOC~~ SOPN: 200.0000 [IU] | PEN_INJECTOR | Freq: Every day | SUBCUTANEOUS | 0 refills | Status: DC

## 2019-06-11 NOTE — Telephone Encounter (Signed)
1.  Please schedule f/u appt 2.  Then please refill x 1, pending that appt.  

## 2019-06-11 NOTE — Telephone Encounter (Signed)
Please advise. Pt was scheduled to be seen today but rescheduled to 6/2

## 2019-06-11 NOTE — Telephone Encounter (Signed)
Outpatient Medication Detail   Disp Refills Start End   insulin degludec (TRESIBA FLEXTOUCH) 200 UNIT/ML FlexTouch Pen 60 mL 0 06/11/2019    Sig - Route: Inject 200 Units into the skin daily. OVERDUE FOR AN APPT. WILL ONLY PROVIDE 30 DAY SUPPLY. NO REFILLS AFTER UNTIL SEEN - Subcutaneous   Sent to pharmacy as: insulin degludec (TRESIBA FLEXTOUCH) 200 UNIT/ML FlexTouch Pen   E-Prescribing Status: Receipt confirmed by pharmacy (06/11/2019  1:18 PM EDT)    Outpatient Medication Detail   Disp Refills Start End   Semaglutide, 1 MG/DOSE, (OZEMPIC, 1 MG/DOSE,) 2 MG/1.5ML SOPN 2 pen 0 06/11/2019    Sig - Route: Inject 1 mg into the skin once a week. OVERDUE FOR AN APPT. WILL ONLY PROVIDE 30 DAY SUPPLY. NO REFILLS AFTER UNTIL SEEN - Subcutaneous   Sent to pharmacy as: Semaglutide, 1 MG/DOSE, (OZEMPIC, 1 MG/DOSE,) 2 MG/1.5ML Solution Pen-injector   E-Prescribing Status: Receipt confirmed by pharmacy (06/11/2019  1:18 PM EDT)    Pharmacy  [facility] [address], [place] - [address]  [address], [place] [address]  Phone:  [phone]  Fax:  [phone]

## 2019-06-11 NOTE — Telephone Encounter (Signed)
Medication Refill Request  Did you call your pharmacy and request this refill first? Yes  . If patient has not contacted pharmacy first, instruct them to do so for future refills.  . Remind them that contacting the pharmacy for their refill is the quickest method to get the refill.  . Refill policy also stated that it will take anywhere between 24-72 hours to receive the refill.    Name of medication? tresiba and ozempic  Is this a 90 day supply? yes  Name and location of pharmacy?   [facility] [address], [place] - [place] [address] Phone:  [phone]  Fax:  [phone]

## 2019-06-21 ENCOUNTER — Other Ambulatory Visit: Payer: Self-pay

## 2019-06-26 ENCOUNTER — Ambulatory Visit (INDEPENDENT_AMBULATORY_CARE_PROVIDER_SITE_OTHER): Payer: BC Managed Care – PPO | Admitting: Endocrinology

## 2019-06-26 ENCOUNTER — Encounter: Payer: Self-pay | Admitting: Endocrinology

## 2019-06-26 ENCOUNTER — Other Ambulatory Visit: Payer: Self-pay

## 2019-06-26 VITALS — BP 170/100 | HR 95 | Ht 71.0 in | Wt 229.0 lb

## 2019-06-26 DIAGNOSIS — E119 Type 2 diabetes mellitus without complications: Secondary | ICD-10-CM | POA: Diagnosis not present

## 2019-06-26 DIAGNOSIS — E1159 Type 2 diabetes mellitus with other circulatory complications: Secondary | ICD-10-CM

## 2019-06-26 LAB — POCT GLYCOSYLATED HEMOGLOBIN (HGB A1C): Hemoglobin A1C: 8.9 % — AB (ref 4.0–5.6)

## 2019-06-26 MED ORDER — TRESIBA FLEXTOUCH 200 UNIT/ML ~~LOC~~ SOPN: 220.0000 [IU] | PEN_INJECTOR | Freq: Every day | SUBCUTANEOUS | 0 refills | Status: DC

## 2019-06-26 MED ORDER — OZEMPIC (1 MG/DOSE) 2 MG/1.5ML ~~LOC~~ SOPN: 1.0000 mg | PEN_INJECTOR | SUBCUTANEOUS | 0 refills | Status: DC

## 2019-06-26 NOTE — Patient Instructions (Signed)
Your blood pressure is high today.  Please see your primary care provider soon, to have it rechecked.  Please check your blood sugar twice a day.  vary the time of day when you check, between before the 3 meals, and at bedtime.  also check if you have symptoms of your blood sugar being too high or too low.  please keep a record of the readings and bring it to your next appointment here (or you can bring the meter itself).  You can write it on any piece of paper.  please call us sooner if your blood sugar goes below 70, or if you have a lot of readings over 200.  Please increase the Tresiba to 220 units daily, and: Please continue the same other medications.    Please come back for a follow-up appointment in 2-3 months.

## 2019-06-26 NOTE — Progress Notes (Signed)
Subjective:    Patient ID: Nathan [name], male    DOB: 1975/02/29, 44 y.o.   MRN: [id number]  HPI Pt returns for f/u of diabetes mellitus:   DM type: Insulin-requiring type 2. Dx'ed: 9892 Complications: polyneuropathy.   Therapy: insulin since 201 DKA: never Severe hypoglycemia: never.   Pancreatitis: never Pancreatic imaging: normal on 2012 CT.   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  Other: he declines multiple daily injections.   Interval history: pt says he seldom misses meds.  no cbg record, but states cbg's vary from 190-230.  pt states he feels well in general.   Past Medical History:  Diagnosis Date  . Cardiac arrest ([place])   . Chest pain   . Diabetes mellitus   . HLD (hyperlipidemia)   . Hypertension   . Obesity   . Pneumonia   . Syncope   . Thyroid disease   . TMJ (dislocation of temporomandibular joint)     Past Surgical History:  Procedure Laterality Date  . CARDIAC CATHETERIZATION     no blockage  . HAND SURGERY      Social History   Socioeconomic History  . Marital status: Married    Spouse name: Not on file  . Number of children: Not on file  . Years of education: Not on file  . Highest education level: Not on file  Occupational History  . Not on file  Tobacco Use  . Smoking status: Former Smoker    Years: 6.00    Types: Cigarettes    Quit date: 01/25/2008    Years since quitting: 11.4  . Smokeless tobacco: Never Used  Substance and Sexual Activity  . Alcohol use: Not Currently    Alcohol/week: 4.0 standard drinks    Types: 4 Cans of beer per week    Comment: 3-4 beers weekly  . Drug use: No  . Sexual activity: Not on file  Other Topics Concern  . Not on file  Social History Narrative  . Not on file   Social Determinants of Health   Financial Resource Strain:   . Difficulty of Paying Living Expenses:   Food Insecurity:   . Worried About Charity fundraiser in the Last Year:   . Arboriculturist in the Last Year:     Transportation Needs:   . Film/video editor (Medical):   Marland Kitchen Lack of Transportation (Non-Medical):   Physical Activity:   . Days of Exercise per Week:   . Minutes of Exercise per Session:   Stress:   . Feeling of Stress :   Social Connections:   . Frequency of Communication with Friends and Family:   . Frequency of Social Gatherings with Friends and Family:   . Attends Religious Services:   . Active Member of Clubs or Organizations:   . Attends Archivist Meetings:   Marland Kitchen Marital Status:   Intimate Partner Violence:   . Fear of Current or Ex-Partner:   . Emotionally Abused:   Marland Kitchen Physically Abused:   . Sexually Abused:     Current Outpatient Medications on File Prior to Visit  Medication Sig Dispense Refill  . amLODipine (NORVASC) 10 MG tablet Take 1 tablet (10 mg total) by mouth daily. 30 tablet 3  . canagliflozin (INVOKANA) 300 MG TABS tablet Take 1 tablet (300 mg total) by mouth daily before breakfast. 90 tablet 3  . cyclobenzaprine (FLEXERIL) 10 MG tablet Take 1 tablet (10 mg total) by mouth 3 (  three) times daily. 20 tablet 0  . levothyroxine (SYNTHROID) 75 MCG tablet TAKE 1 TABLET BY MOUTH ONCE DAILY BEFORE BREAKFAST 90 tablet 0  . naproxen (NAPROSYN) 500 MG tablet Take 1 tablet (500 mg total) by mouth 2 (two) times daily. 30 tablet 0  . RELION PEN NEEDLES 31G X 6 MM MISC USE  ONCE DAILY 50 each 0  . tiZANidine (ZANAFLEX) 4 MG tablet Take 1 tablet by mouth every 6 (six) hours as needed.    . traMADol (ULTRAM) 50 MG tablet Take 1 tablet (50 mg total) by mouth every 6 (six) hours as needed. 12 tablet 0   No current facility-administered medications on file prior to visit.    Allergies  Allergen Reactions  . Benzocaine Anaphylaxis and Other (See Comments)    REACTION: Blistering all over and throat swells shut  . Orange Juice [Orange Oil] Anaphylaxis  . Grapefruit Concentrate     Family History  Problem Relation Age of Onset  . Heart failure Mother   .  Diabetes Mother   . Heart attack Father        x5   . Diabetes Father     BP (!) 170/100   Pulse 95   Ht 5\' 11"  (1.803 m)   Wt 229 lb (103.9 kg)   SpO2 98%   BMI 31.94 kg/m    Review of Systems He denies hypoglycemia.      Objective:   Physical Exam VITAL SIGNS:  See vs page GENERAL: no distress Pulses: dorsalis pedis intact bilat.   MSK: no deformity of the feet CV: no leg edema Skin:  no ulcer on the feet.  normal color and temp on the feet. Neuro: sensation is intact to touch on the feet  Lab Results  Component Value Date   HGBA1C 8.9 (A) 06/26/2019        Assessment & Plan:  HTN: is noted today Insulin-requiring type 2 DM, with PN: he needs increased rx   Patient Instructions  Your blood pressure is high today.  Please see your primary care provider soon, to have it rechecked.  Please check your blood sugar twice a day.  vary the time of day when you check, between before the 3 meals, and at bedtime.  also check if you have symptoms of your blood sugar being too high or too low.  please keep a record of the readings and bring it to your next appointment here (or you can bring the meter itself).  You can write it on any piece of paper.  please call 08/26/2019 sooner if your blood sugar goes below 70, or if you have a lot of readings over 200.  Please increase the Tresiba to 220 units daily, and: Please continue the same other medications.    Please come back for a follow-up appointment in 2-3 months.

## 2019-06-27 ENCOUNTER — Other Ambulatory Visit: Payer: Self-pay | Admitting: Endocrinology

## 2019-06-28 DIAGNOSIS — G4733 Obstructive sleep apnea (adult) (pediatric): Secondary | ICD-10-CM | POA: Diagnosis not present

## 2019-07-23 DIAGNOSIS — G4733 Obstructive sleep apnea (adult) (pediatric): Secondary | ICD-10-CM | POA: Diagnosis not present

## 2019-08-22 DIAGNOSIS — G4733 Obstructive sleep apnea (adult) (pediatric): Secondary | ICD-10-CM | POA: Diagnosis not present

## 2019-08-30 DIAGNOSIS — L03211 Cellulitis of face: Secondary | ICD-10-CM | POA: Diagnosis not present

## 2019-08-30 DIAGNOSIS — E1165 Type 2 diabetes mellitus with hyperglycemia: Secondary | ICD-10-CM | POA: Diagnosis not present

## 2019-08-30 DIAGNOSIS — Z681 Body mass index (BMI) 19 or less, adult: Secondary | ICD-10-CM | POA: Diagnosis not present

## 2019-08-30 DIAGNOSIS — I10 Essential (primary) hypertension: Secondary | ICD-10-CM | POA: Diagnosis not present

## 2019-08-30 DIAGNOSIS — R5383 Other fatigue: Secondary | ICD-10-CM | POA: Diagnosis not present

## 2019-09-03 DIAGNOSIS — E063 Autoimmune thyroiditis: Secondary | ICD-10-CM | POA: Diagnosis not present

## 2019-09-03 DIAGNOSIS — E119 Type 2 diabetes mellitus without complications: Secondary | ICD-10-CM | POA: Diagnosis not present

## 2019-09-03 DIAGNOSIS — E559 Vitamin D deficiency, unspecified: Secondary | ICD-10-CM | POA: Diagnosis not present

## 2019-09-03 DIAGNOSIS — E782 Mixed hyperlipidemia: Secondary | ICD-10-CM | POA: Diagnosis not present

## 2019-09-22 DIAGNOSIS — G4733 Obstructive sleep apnea (adult) (pediatric): Secondary | ICD-10-CM | POA: Diagnosis not present

## 2019-09-26 ENCOUNTER — Other Ambulatory Visit: Payer: Self-pay | Admitting: Endocrinology

## 2019-09-26 DIAGNOSIS — E039 Hypothyroidism, unspecified: Secondary | ICD-10-CM

## 2019-10-01 ENCOUNTER — Ambulatory Visit: Payer: BC Managed Care – PPO | Admitting: Endocrinology

## 2019-10-14 ENCOUNTER — Emergency Department (HOSPITAL_COMMUNITY)
Admission: EM | Admit: 2019-10-14 | Discharge: 2019-10-14 | Disposition: A | Discharge status: Home / Self Care | Payer: BC Managed Care – PPO | Attending: Emergency Medicine | Admitting: Emergency Medicine

## 2019-10-14 ENCOUNTER — Other Ambulatory Visit: Payer: Self-pay

## 2019-10-14 ENCOUNTER — Encounter (HOSPITAL_COMMUNITY): Payer: Self-pay | Admitting: Emergency Medicine

## 2019-10-14 ENCOUNTER — Emergency Department (HOSPITAL_COMMUNITY): Payer: BC Managed Care – PPO

## 2019-10-14 DIAGNOSIS — E785 Hyperlipidemia, unspecified: Secondary | ICD-10-CM | POA: Diagnosis not present

## 2019-10-14 DIAGNOSIS — R0789 Other chest pain: Secondary | ICD-10-CM | POA: Diagnosis not present

## 2019-10-14 DIAGNOSIS — R42 Dizziness and giddiness: Secondary | ICD-10-CM | POA: Diagnosis not present

## 2019-10-14 DIAGNOSIS — Z5321 Procedure and treatment not carried out due to patient leaving prior to being seen by health care provider: Secondary | ICD-10-CM | POA: Diagnosis not present

## 2019-10-14 DIAGNOSIS — I10 Essential (primary) hypertension: Secondary | ICD-10-CM | POA: Diagnosis not present

## 2019-10-14 DIAGNOSIS — E119 Type 2 diabetes mellitus without complications: Secondary | ICD-10-CM | POA: Diagnosis not present

## 2019-10-14 DIAGNOSIS — R079 Chest pain, unspecified: Secondary | ICD-10-CM | POA: Diagnosis not present

## 2019-10-14 NOTE — ED Triage Notes (Signed)
Patient was taking the dishes out of the dishwasher felt dizzy and started having left-sided chest pain, radiating to neck and back.  Went to urgent care, had EKG performed and was told to come to ER.

## 2019-10-18 ENCOUNTER — Other Ambulatory Visit: Payer: Self-pay

## 2019-10-18 ENCOUNTER — Ambulatory Visit (INDEPENDENT_AMBULATORY_CARE_PROVIDER_SITE_OTHER): Payer: BC Managed Care – PPO | Admitting: Endocrinology

## 2019-10-18 VITALS — BP 140/80 | HR 97 | Ht 71.0 in | Wt 220.0 lb

## 2019-10-18 DIAGNOSIS — E1159 Type 2 diabetes mellitus with other circulatory complications: Secondary | ICD-10-CM

## 2019-10-18 LAB — POCT GLYCOSYLATED HEMOGLOBIN (HGB A1C): Hemoglobin A1C: 8 % — AB (ref 4.0–5.6)

## 2019-10-18 MED ORDER — CEPHALEXIN 500 MG PO CAPS: 500.0000 mg | ORAL_CAPSULE | Freq: Three times a day (TID) | ORAL | 0 refills | Status: DC

## 2019-10-18 NOTE — Patient Instructions (Addendum)
Your blood pressure is high today.  Please see your primary care provider soon, to have it rechecked.  Please check your blood sugar twice a day.  vary the time of day when you check, between before the 3 meals, and at bedtime.  also check if you have symptoms of your blood sugar being too high or too low.  please keep a record of the readings and bring it to your next appointment here (or you can bring the meter itself).  You can write it on any piece of paper.  please call us sooner if your blood sugar goes below 70, or if you have a lot of readings over 200.  Please resume the Ozempic (here is a discount card), and: Please continue the same other medications.    I have sent a prescription to your pharmacy, for an antibiotic pill.   Please see Dr Sherwood Gambler, if the toe symptoms do not get better in the next few days.   Please come back for a follow-up appointment in 2 months.

## 2019-10-18 NOTE — Progress Notes (Signed)
Subjective:    Patient ID: Nathan [name], male    DOB: 14-Nov-1975, 43 y.o.   MRN: [id number]  HPI Pt returns for f/u of diabetes mellitus:   DM type: Insulin-requiring type 2. Dx'ed: 2010 Complications: PN.  Therapy: insulin since 2010, Ozempic, and Invokana.   DKA: never Severe hypoglycemia: never.   Pancreatitis: never Pancreatic imaging: normal on 2012 CT.   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  Other: he declines multiple daily injections.   Interval history: pt says he seldom misses meds.  no cbg record, but states cbg's vary from 82-200.  pt states he feels well in general. He has not taken the Ozempic in 1 month, due to high copay.  Main symptom is fatigue.   Past Medical History:  Diagnosis Date  . Cardiac arrest (HCC)   . Chest pain   . Diabetes mellitus   . HLD (hyperlipidemia)   . Hypertension   . Obesity   . Pneumonia   . Syncope   . Thyroid disease   . TMJ (dislocation of temporomandibular joint)     Past Surgical History:  Procedure Laterality Date  . CARDIAC CATHETERIZATION     no blockage  . HAND SURGERY      Social History   Socioeconomic History  . Marital status: Married    Spouse name: Not on file  . Number of children: Not on file  . Years of education: Not on file  . Highest education level: Not on file  Occupational History  . Not on file  Tobacco Use  . Smoking status: Former Smoker    Years: 6.00    Types: Cigarettes    Quit date: 01/25/2008    Years since quitting: 11.7  . Smokeless tobacco: Never Used  Substance and Sexual Activity  . Alcohol use: Not Currently    Alcohol/week: 4.0 standard drinks    Types: 4 Cans of beer per week    Comment: 3-4 beers weekly  . Drug use: No  . Sexual activity: Not on file  Other Topics Concern  . Not on file  Social History Narrative  . Not on file   Social Determinants of Health   Financial Resource Strain:   . Difficulty of Paying Living Expenses: Not on file  Food  Insecurity:   . Worried About Programme researcher, broadcasting/film/video in the Last Year: Not on file  . Ran Out of Food in the Last Year: Not on file  Transportation Needs:   . Lack of Transportation (Medical): Not on file  . Lack of Transportation (Non-Medical): Not on file  Physical Activity:   . Days of Exercise per Week: Not on file  . Minutes of Exercise per Session: Not on file  Stress:   . Feeling of Stress : Not on file  Social Connections:   . Frequency of Communication with Friends and Family: Not on file  . Frequency of Social Gatherings with Friends and Family: Not on file  . Attends Religious Services: Not on file  . Active Member of Clubs or Organizations: Not on file  . Attends Banker Meetings: Not on file  . Marital Status: Not on file  Intimate Partner Violence:   . Fear of Current or Ex-Partner: Not on file  . Emotionally Abused: Not on file  . Physically Abused: Not on file  . Sexually Abused: Not on file    Current Outpatient Medications on File Prior to Visit  Medication Sig Dispense Refill  .  amLODipine (NORVASC) 10 MG tablet Take 1 tablet (10 mg total) by mouth daily. 30 tablet 3  . canagliflozin (INVOKANA) 300 MG TABS tablet Take 1 tablet (300 mg total) by mouth daily before breakfast. 90 tablet 3  . cyclobenzaprine (FLEXERIL) 10 MG tablet Take 1 tablet (10 mg total) by mouth 3 (three) times daily. 20 tablet 0  . EUTHYROX 75 MCG tablet TAKE 1 TABLET BY MOUTH ONCE DAILY BEFORE BREAKFAST 90 tablet 0  . insulin degludec (TRESIBA FLEXTOUCH) 200 UNIT/ML FlexTouch Pen Inject 220 Units into the skin daily. 60 mL 0  . naproxen (NAPROSYN) 500 MG tablet Take 1 tablet (500 mg total) by mouth 2 (two) times daily. 30 tablet 0  . RELION PEN NEEDLES 31G X 6 MM MISC USE  ONCE DAILY 50 each 2  . Semaglutide, 1 MG/DOSE, (OZEMPIC, 1 MG/DOSE,) 2 MG/1.5ML SOPN Inject 1 mg into the skin once a week. 2 pen 0  . tiZANidine (ZANAFLEX) 4 MG tablet Take 1 tablet by mouth every 6 (six) hours  as needed.    . traMADol (ULTRAM) 50 MG tablet Take 1 tablet (50 mg total) by mouth every 6 (six) hours as needed. 12 tablet 0   No current facility-administered medications on file prior to visit.    Allergies  Allergen Reactions  . Benzocaine Anaphylaxis and Other (See Comments)    REACTION: Blistering all over and throat swells shut  . Orange Juice [Orange Oil] Anaphylaxis  . Flavoring Agent   . Grapefruit Concentrate     Family History  Problem Relation Age of Onset  . Heart failure Mother   . Diabetes Mother   . Heart attack Father        x5   . Diabetes Father     BP 140/80   Pulse 97   Ht 5\' 11"  (1.803 m)   Wt 220 lb (99.8 kg)   SpO2 98%   BMI 30.68 kg/m    Review of Systems He denies hypoglycemia.      Objective:   Physical Exam VITAL SIGNS:  See vs page GENERAL: no distress Pulses: dorsalis pedis intact bilat.   MSK: no deformity of the feet CV: no leg edema Skin:  no ulcer on the feet.  normal color and temp on the feet. Neuro: sensation is intact to touch on the feet EXT: left great toenail is ingrown.  There is paronychial swell/erythema/drainage.    Lab Results  Component Value Date   HGBA1C 8.0 (A) 10/18/2019       Assessment & Plan:  Paronychia, new HTN: is noted today Insulin-requiring type 2 DM: uncontrolled.    Patient Instructions  Your blood pressure is high today.  Please see your primary care provider soon, to have it rechecked.  Please check your blood sugar twice a day.  vary the time of day when you check, between before the 3 meals, and at bedtime.  also check if you have symptoms of your blood sugar being too high or too low.  please keep a record of the readings and bring it to your next appointment here (or you can bring the meter itself).  You can write it on any piece of paper.  please call 10/20/2019 sooner if your blood sugar goes below 70, or if you have a lot of readings over 200.  Please resume the Ozempic (here is a discount  card), and: Please continue the same other medications.    I have sent a prescription to your pharmacy,  for an antibiotic pill.   Please see Dr Sherwood Gambler, if the toe symptoms do not get better in the next few days.   Please come back for a follow-up appointment in 2 months.

## 2019-12-27 ENCOUNTER — Ambulatory Visit: Payer: Self-pay | Admitting: Endocrinology

## 2019-12-30 ENCOUNTER — Telehealth: Payer: Self-pay

## 2019-12-30 ENCOUNTER — Other Ambulatory Visit: Payer: Self-pay

## 2019-12-30 MED ORDER — CANAGLIFLOZIN 300 MG PO TABS: 300.0000 mg | ORAL_TABLET | Freq: Every day | ORAL | 3 refills | Status: DC

## 2019-12-30 NOTE — Telephone Encounter (Signed)
F/u is due.  Let's address then 

## 2019-12-30 NOTE — Telephone Encounter (Signed)
Notified via insurance- it prefers to [place] first before invokana.  Would you like to change?  Thanks!

## 2020-01-03 ENCOUNTER — Telehealth: Payer: Self-pay | Admitting: Endocrinology

## 2020-01-03 NOTE — Telephone Encounter (Signed)
Please verify tresiba is 220 units qd.  Then please increase to 260 units qd.

## 2020-01-03 NOTE — Telephone Encounter (Signed)
Called patient to advise any Rx adjustments can be made at scheduled follow up appointment next week and patient advised his blood glucose is currently 462 and have been running high since he ran out of Invokana (about 2 weeks.) Patient wants to know what he should do to bring it down.

## 2020-01-03 NOTE — Telephone Encounter (Signed)
Patient's wife Lyla Son called to request a new RX for for [employer] does not cover Invokana) be sent to  [facility] [address], [place] - [place] [address] Phone:  [phone]  Fax:  [phone]

## 2020-01-06 NOTE — Telephone Encounter (Signed)
Called patient, LVM to call back to discuss.   

## 2020-01-07 ENCOUNTER — Encounter: Payer: Self-pay | Admitting: Endocrinology

## 2020-01-07 ENCOUNTER — Other Ambulatory Visit: Payer: Self-pay

## 2020-01-07 ENCOUNTER — Ambulatory Visit (INDEPENDENT_AMBULATORY_CARE_PROVIDER_SITE_OTHER): Payer: PRIVATE HEALTH INSURANCE | Admitting: Endocrinology

## 2020-01-07 VITALS — BP 152/72 | HR 90 | Ht 73.0 in | Wt 221.0 lb

## 2020-01-07 DIAGNOSIS — N529 Male erectile dysfunction, unspecified: Secondary | ICD-10-CM

## 2020-01-07 DIAGNOSIS — E1159 Type 2 diabetes mellitus with other circulatory complications: Secondary | ICD-10-CM | POA: Diagnosis not present

## 2020-01-07 LAB — POCT GLYCOSYLATED HEMOGLOBIN (HGB A1C): Hemoglobin A1C: 8.8 % — AB (ref 4.0–5.6)

## 2020-01-07 MED ORDER — TRESIBA FLEXTOUCH 200 UNIT/ML ~~LOC~~ SOPN: 220.0000 [IU] | PEN_INJECTOR | Freq: Every day | SUBCUTANEOUS | 0 refills | Status: DC

## 2020-01-07 MED ORDER — EMPAGLIFLOZIN 25 MG PO TABS: 25.0000 mg | ORAL_TABLET | Freq: Every day | ORAL | 3 refills | Status: DC

## 2020-01-07 NOTE — Progress Notes (Signed)
poct

## 2020-01-07 NOTE — Patient Instructions (Addendum)
Your blood pressure is high today.  Please see your primary care provider soon, to have it rechecked.  Please check your blood sugar twice a day.  vary the time of day when you check, between before the 3 meals, and at bedtime.  also check if you have symptoms of your blood sugar being too high or too low.  please keep a record of the readings and bring it to your next appointment here (or you can bring the meter itself).  You can write it on any piece of paper.  please call us sooner if your blood sugar goes below 70, or if you have a lot of readings over 200.  I have sent 2 prescriptions to your pharmacy: to change Invokana to Jardiance, and to increase the [place] to 220 units daily.  Please continue the same Ozempic. Blood tests are requested for you today.  We'll let you know about the results.  Please come back for a follow-up appointment in 2 months.

## 2020-01-07 NOTE — Progress Notes (Signed)
Subjective:    Patient ID: Nathan [name], male    DOB: [birth year], 44 y.o.   MRN: [id number]  HPI Pt returns for f/u of diabetes mellitus:   DM type: Insulin-requiring type 2. Dx'ed: 2010 Complications: PN.  Therapy: insulin since 2010, Ozempic, and Invokana.   DKA: never Severe hypoglycemia: never.   Pancreatitis: never Pancreatic imaging: normal on 2012 CT.   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   Other: he declines multiple daily injections.   Interval history: Main symptom is fatigue.  Pt says he did not receive the message to increase [place] last week.  Ins no longer covers Invokana.  no cbg record, but states cbg's vary from 85-240.  It is in general higher as the day goes on.  Past Medical History:  Diagnosis Date  . Cardiac arrest (HCC)   . Chest pain   . Diabetes mellitus   . HLD (hyperlipidemia)   . Hypertension   . Obesity   . Pneumonia   . Syncope   . Thyroid disease   . TMJ (dislocation of temporomandibular joint)     Past Surgical History:  Procedure Laterality Date  . CARDIAC CATHETERIZATION     no blockage  . HAND SURGERY      Social History   Socioeconomic History  . Marital status: Married    Spouse name: Not on file  . Number of children: Not on file  . Years of education: Not on file  . Highest education level: Not on file  Occupational History  . Not on file  Tobacco Use  . Smoking status: Former Smoker    Years: 6.00    Types: Cigarettes    Quit date: 01/25/2008    Years since quitting: 11.9  . Smokeless tobacco: Never Used  Substance and Sexual Activity  . Alcohol use: Not Currently    Alcohol/week: 4.0 standard drinks    Types: 4 Cans of beer per week    Comment: 3-4 beers weekly  . Drug use: No  . Sexual activity: Not on file  Other Topics Concern  . Not on file  Social History Narrative  . Not on file   Social Determinants of Health   Financial Resource Strain: Not on file  Food Insecurity: Not on file   Transportation Needs: Not on file  Physical Activity: Not on file  Stress: Not on file  Social Connections: Not on file  Intimate Partner Violence: Not on file    Current Outpatient Medications on File Prior to Visit  Medication Sig Dispense Refill  . amLODipine (NORVASC) 10 MG tablet Take 1 tablet (10 mg total) by mouth daily. 30 tablet 3  . cyclobenzaprine (FLEXERIL) 10 MG tablet Take 1 tablet (10 mg total) by mouth 3 (three) times daily. 20 tablet 0  . naproxen (NAPROSYN) 500 MG tablet Take 1 tablet (500 mg total) by mouth 2 (two) times daily. 30 tablet 0  . RELION PEN NEEDLES 31G X 6 MM MISC USE  ONCE DAILY 50 each 2  . Semaglutide, 1 MG/DOSE, (OZEMPIC, 1 MG/DOSE,) 2 MG/1.5ML SOPN Inject 1 mg into the skin once a week. 2 pen 0  . tiZANidine (ZANAFLEX) 4 MG tablet Take 1 tablet by mouth every 6 (six) hours as needed.    . traMADol (ULTRAM) 50 MG tablet Take 1 tablet (50 mg total) by mouth every 6 (six) hours as needed. 12 tablet 0   No current facility-administered medications on file prior to visit.  Allergies  Allergen Reactions  . Benzocaine Anaphylaxis and Other (See Comments)    REACTION: Blistering all over and throat swells shut  . Orange Juice [Orange Oil] Anaphylaxis  . Flavoring Agent   . Grapefruit Concentrate     Family History  Problem Relation Age of Onset  . Heart failure Mother   . Diabetes Mother   . Heart attack Father        x5   . Diabetes Father     BP (!) 152/72   Pulse 90   Ht 6\' 1"  (1.854 m)   Wt 221 lb (100.2 kg)   SpO2 98%   BMI 29.16 kg/m    Review of Systems He denies hypoglycemia.  He reports ED sxs.      Objective:   Physical Exam VITAL SIGNS:  See vs page GENERAL: no distress Pulses: dorsalis pedis intact bilat.   MSK: no deformity of the feet CV: no leg edema Skin:  no ulcer on the feet.  normal color and temp on the feet.  Neuro: sensation is intact to touch on the feet.  EXT: left great toenail is ingrown, but the  paronychial infection is resolved.     Lab Results  Component Value Date   CREATININE 0.75 02/13/2018   BUN 11 02/13/2018   NA 134 (L) 02/13/2018   K 4.0 02/13/2018   CL 98 02/13/2018   CO2 28 02/13/2018   Lab Results  Component Value Date   HGBA1C 8.8 (A) 01/07/2020       Assessment & Plan:  HTN: is noted today ED, new, uncertain etiology and prognosis.  Insulin-requiring type 2 DM, with PN: uncontrolled.   Patient Instructions  Your blood pressure is high today.  Please see your primary care provider soon, to have it rechecked.  Please check your blood sugar twice a day.  vary the time of day when you check, between before the 3 meals, and at bedtime.  also check if you have symptoms of your blood sugar being too high or too low.  please keep a record of the readings and bring it to your next appointment here (or you can bring the meter itself).  You can write it on any piece of paper.  please call 01/09/2020 sooner if your blood sugar goes below 70, or if you have a lot of readings over 200.  I have sent 2 prescriptions to your pharmacy: to change Invokana to Jardiance, and to increase the [place] to 220 units daily.  Please continue the same Ozempic. Blood tests are requested for you today.  We'll let you know about the results.  Please come back for a follow-up appointment in 2 months.

## 2020-01-08 LAB — BASIC METABOLIC PANEL
BUN: 12 mg/dL (ref 6–23)
CO2: 30 mEq/L (ref 19–32)
Calcium: 9.8 mg/dL (ref 8.4–10.5)
Chloride: 98 mEq/L (ref 96–112)
Creatinine, Ser: 0.88 mg/dL (ref 0.40–1.50)
GFR: 105.66 mL/min (ref >60.00)
Glucose, Bld: 382 mg/dL — ABNORMAL HIGH (ref 70–99)
Potassium: 4.3 mEq/L (ref 3.5–5.1)
Sodium: 137 mEq/L (ref 135–145)

## 2020-01-08 LAB — TSH: TSH: 4.51 u[IU]/mL — ABNORMAL HIGH (ref 0.35–4.50)

## 2020-01-08 LAB — T4, FREE: Free T4: 0.79 ng/dL (ref 0.60–1.60)

## 2020-01-08 MED ORDER — LEVOTHYROXINE SODIUM 88 MCG PO TABS: 88.0000 ug | ORAL_TABLET | Freq: Every day | ORAL | 3 refills | Status: DC

## 2020-01-08 NOTE — Telephone Encounter (Signed)
Pt had an appointment yesterday with Dr. Everardo All and discuss the increase of the medication Rx Tresiba.

## 2020-01-10 ENCOUNTER — Telehealth: Payer: Self-pay | Admitting: *Deleted

## 2020-01-10 LAB — TESTOSTERONE,FREE AND TOTAL
Testosterone, Free: 6.3 pg/mL — ABNORMAL LOW (ref 6.8–21.5)
Testosterone: 188 ng/dL — ABNORMAL LOW (ref 264–916)

## 2020-01-10 MED ORDER — TOUJEO MAX SOLOSTAR 300 UNIT/ML ~~LOC~~ SOPN: 220.0000 [IU] | PEN_INJECTOR | SUBCUTANEOUS | 3 refills | Status: DC

## 2020-01-10 NOTE — Telephone Encounter (Signed)
I have sent a prescription to your pharmacy, to change to Toujeo Day 1: last dose of Tresiba Day 2: no insulin Day 3: toujeo just 110 units qam Day 4 and thereafter: toujeo 220 units qam. I'll see you next time.

## 2020-01-10 NOTE — Telephone Encounter (Signed)
Patient's wife called stating the insurance covers Lantus tier 1 and Toujeo as an alternative to tresiba. FYI

## 2020-01-10 NOTE — Telephone Encounter (Signed)
Telephone encounter has been opened by another CMA. Please see message.  Thanks!

## 2020-01-10 NOTE — Telephone Encounter (Signed)
Please advise 

## 2020-01-10 NOTE — Telephone Encounter (Signed)
Called and talk to pt wife-- Rx tresiba flextouch is not covered by the insurance. Pt wife stated--will call the insurance for other alternatives and will call back the office for the information.

## 2020-01-13 NOTE — Telephone Encounter (Signed)
Called and left a message for patient to call back to get instructions on how to stop current insulin and start the new RX.

## 2020-01-21 NOTE — Telephone Encounter (Signed)
Called and left message for patient to call back. Sent patient a MyChart message detailing instructions to switch insulin.

## 2020-01-22 NOTE — Telephone Encounter (Signed)
Patient's wife called back to advise patient has switched from [place] to [place] and has not had any issues. Will continue to monitor as instructed.

## 2020-02-04 ENCOUNTER — Other Ambulatory Visit: Payer: Self-pay

## 2020-02-06 ENCOUNTER — Encounter: Payer: Self-pay | Admitting: Endocrinology

## 2020-02-06 ENCOUNTER — Ambulatory Visit (INDEPENDENT_AMBULATORY_CARE_PROVIDER_SITE_OTHER): Payer: PRIVATE HEALTH INSURANCE | Admitting: Endocrinology

## 2020-02-06 ENCOUNTER — Other Ambulatory Visit: Payer: Self-pay

## 2020-02-06 VITALS — BP 142/88 | HR 80 | Ht 72.0 in | Wt 226.0 lb

## 2020-02-06 DIAGNOSIS — E1159 Type 2 diabetes mellitus with other circulatory complications: Secondary | ICD-10-CM | POA: Diagnosis not present

## 2020-02-06 DIAGNOSIS — R7989 Other specified abnormal findings of blood chemistry: Secondary | ICD-10-CM | POA: Insufficient documentation

## 2020-02-06 DIAGNOSIS — E291 Testicular hypofunction: Secondary | ICD-10-CM | POA: Insufficient documentation

## 2020-02-06 LAB — POCT GLYCOSYLATED HEMOGLOBIN (HGB A1C): Hemoglobin A1C: 9.8 % — AB (ref 4.0–5.6)

## 2020-02-06 LAB — LUTEINIZING HORMONE: LH: 3.4 m[IU]/mL (ref 1.50–9.30)

## 2020-02-06 LAB — FOLLICLE STIMULATING HORMONE: FSH: 5.3 m[IU]/mL (ref 1.4–18.1)

## 2020-02-06 MED ORDER — EMPAGLIFLOZIN 25 MG PO TABS: 25.0000 mg | ORAL_TABLET | Freq: Every day | ORAL | 3 refills | Status: DC

## 2020-02-06 MED ORDER — TRULICITY 0.75 MG/0.5ML ~~LOC~~ SOAJ
0.7500 mg | SUBCUTANEOUS | 3 refills | Status: DC
Start: 2020-02-06 — End: 2021-01-29

## 2020-02-06 NOTE — Patient Instructions (Addendum)
I have sent a prescription to your pharmacy, to add "Trulicity." Please continue the same other diabetes medications. Your blood pressure is high today.  Please see your primary care provider soon, to have it rechecked.  Please check your blood sugar twice a day.  vary the time of day when you check, between before the 3 meals, and at bedtime.  also check if you have symptoms of your blood sugar being too high or too low.  please keep a record of the readings and bring it to your next appointment here (or you can bring the meter itself).  You can write it on any piece of paper.  please call us sooner if your blood sugar goes below 70, or if you have a lot of readings over 200.  Blood tests are requested for you today.  We'll let you know about the results.  Please come back for a follow-up appointment in 2 months.

## 2020-02-06 NOTE — Progress Notes (Signed)
Subjective:    Patient ID: Nathan [name], male    DOB: [birth year], 45 y.o.   MRN: [id number]  HPI Pt returns for f/u of diabetes mellitus:   DM type: Insulin-requiring type 2. Dx'ed: 2010 Complications: PN.  Therapy: insulin since 2010, Ozempic, and Invokana.   DKA: never Severe hypoglycemia: never.   Pancreatitis: never Pancreatic imaging: normal on 2012 CT.   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   Other: he declines multiple daily injections.   Interval history: pt states he feels well in general.  Ins no longer covers Ozempic.  pt states cbg's are in the mid-100's, but he still seldom checks cbg.   Past Medical History:  Diagnosis Date  . Cardiac arrest (HCC)   . Chest pain   . Diabetes mellitus   . HLD (hyperlipidemia)   . Hypertension   . Obesity   . Pneumonia   . Syncope   . Thyroid disease   . TMJ (dislocation of temporomandibular joint)     Past Surgical History:  Procedure Laterality Date  . CARDIAC CATHETERIZATION     no blockage  . HAND SURGERY      Social History   Socioeconomic History  . Marital status: Married    Spouse name: Not on file  . Number of children: Not on file  . Years of education: Not on file  . Highest education level: Not on file  Occupational History  . Not on file  Tobacco Use  . Smoking status: Former Smoker    Years: 6.00    Types: Cigarettes    Quit date: 01/25/2008    Years since quitting: 12.0  . Smokeless tobacco: Never Used  Substance and Sexual Activity  . Alcohol use: Not Currently    Alcohol/week: 4.0 standard drinks    Types: 4 Cans of beer per week    Comment: 3-4 beers weekly  . Drug use: No  . Sexual activity: Not on file  Other Topics Concern  . Not on file  Social History Narrative  . Not on file   Social Determinants of Health   Financial Resource Strain: Not on file  Food Insecurity: Not on file  Transportation Needs: Not on file  Physical Activity: Not on file  Stress: Not on file   Social Connections: Not on file  Intimate Partner Violence: Not on file    Current Outpatient Medications on File Prior to Visit  Medication Sig Dispense Refill  . amLODipine (NORVASC) 10 MG tablet Take 1 tablet (10 mg total) by mouth daily. 30 tablet 3  . cyclobenzaprine (FLEXERIL) 10 MG tablet Take 1 tablet (10 mg total) by mouth 3 (three) times daily. 20 tablet 0  . insulin glargine, 2 Unit Dial, (TOUJEO MAX SOLOSTAR) 300 UNIT/ML Solostar Pen Inject 220 Units into the skin every morning. 72 mL 3  . levothyroxine (EUTHYROX) 88 MCG tablet Take 1 tablet (88 mcg total) by mouth daily before breakfast. 90 tablet 3  . naproxen (NAPROSYN) 500 MG tablet Take 1 tablet (500 mg total) by mouth 2 (two) times daily. 30 tablet 0  . RELION PEN NEEDLES 31G X 6 MM MISC USE  ONCE DAILY 50 each 2  . tiZANidine (ZANAFLEX) 4 MG tablet Take 1 tablet by mouth every 6 (six) hours as needed.    . traMADol (ULTRAM) 50 MG tablet Take 1 tablet (50 mg total) by mouth every 6 (six) hours as needed. 12 tablet 0   No current facility-administered medications on  file prior to visit.    Allergies  Allergen Reactions  . Benzocaine Anaphylaxis and Other (See Comments)    REACTION: Blistering all over and throat swells shut  . Orange Juice [Orange Oil] Anaphylaxis  . Flavoring Agent   . Grapefruit Concentrate     Family History  Problem Relation Age of Onset  . Heart failure Mother   . Diabetes Mother   . Heart attack Father        x5   . Diabetes Father     BP (!) 142/88   Pulse 80   Ht 6' (1.829 m)   Wt 226 lb (102.5 kg)   SpO2 98%   BMI 30.65 kg/m    Review of Systems     Objective:   Physical Exam VITAL SIGNS:  See vs page GENERAL: no distress Pulses: dorsalis pedis intact bilat.   MSK: no deformity of the feet CV: no leg edema Skin:  no ulcer on the feet.  normal color and temp on the feet. Neuro: sensation is intact to touch on the feet  Lab Results  Component Value Date   HGBA1C  9.8 (A) 02/06/2020      Assessment & Plan:  Insulin-requiring type 2 DM, with PN: uncontrolled HTN: is noted today Low testosterone: recheck today.  Patient Instructions  I have sent a prescription to your pharmacy, to add "Trulicity." Please continue the same other diabetes medications. Your blood pressure is high today.  Please see your primary care provider soon, to have it rechecked.  Please check your blood sugar twice a day.  vary the time of day when you check, between before the 3 meals, and at bedtime.  also check if you have symptoms of your blood sugar being too high or too low.  please keep a record of the readings and bring it to your next appointment here (or you can bring the meter itself).  You can write it on any piece of paper.  please call us sooner if your blood sugar goes below 70, or if you have a lot of readings over 200.  Blood tests are requested for you today.  We'll let you know about the results.  Please come back for a follow-up appointment in 2 months.

## 2020-02-07 LAB — PROLACTIN: Prolactin: 8.2 ng/mL (ref 2.0–18.0)

## 2020-02-07 LAB — TESTOSTERONE,FREE AND TOTAL
Testosterone, Free: 11.6 pg/mL (ref 6.8–21.5)
Testosterone: 327 ng/dL (ref 264–916)

## 2020-03-13 ENCOUNTER — Ambulatory Visit: Payer: PRIVATE HEALTH INSURANCE | Admitting: Endocrinology

## 2020-04-10 ENCOUNTER — Ambulatory Visit: Payer: PRIVATE HEALTH INSURANCE | Admitting: Endocrinology

## 2020-06-08 ENCOUNTER — Telehealth: Payer: Self-pay

## 2020-06-08 NOTE — Telephone Encounter (Signed)
LVM for pt to let him know that I submitted a PA for his Trulicity but it came back stating that the pt must use their primary insurance first bc [employer] is the secondary insurance and if this is the only insurance that the pt has he needs to contact the # on the back of his card to let them know that he has no other insurance so that I can run the PA bc it will not let me go any further with the process until this information is updated and corrected.  Waiting to hear back from pt.

## 2020-06-18 ENCOUNTER — Other Ambulatory Visit: Payer: Self-pay

## 2020-06-18 DIAGNOSIS — E1159 Type 2 diabetes mellitus with other circulatory complications: Secondary | ICD-10-CM

## 2020-06-18 DIAGNOSIS — E039 Hypothyroidism, unspecified: Secondary | ICD-10-CM

## 2020-06-18 MED ORDER — TOUJEO MAX SOLOSTAR 300 UNIT/ML ~~LOC~~ SOPN: 220.0000 [IU] | PEN_INJECTOR | SUBCUTANEOUS | 0 refills | Status: DC

## 2020-06-18 MED ORDER — LEVOTHYROXINE SODIUM 88 MCG PO TABS: 88.0000 ug | ORAL_TABLET | Freq: Every day | ORAL | 0 refills | Status: DC

## 2020-06-25 ENCOUNTER — Other Ambulatory Visit: Payer: Self-pay

## 2020-06-26 ENCOUNTER — Telehealth: Payer: Self-pay

## 2020-06-26 NOTE — Telephone Encounter (Signed)
Request Reference Number: [id number]. TOUJEO MAX INJ 300IU/ML is denied for not meeting the prior authorization requirement(s)

## 2020-09-24 ENCOUNTER — Ambulatory Visit: Payer: PRIVATE HEALTH INSURANCE | Admitting: Endocrinology

## 2020-10-30 ENCOUNTER — Emergency Department (HOSPITAL_COMMUNITY)
Admission: EM | Admit: 2020-10-30 | Discharge: 2020-10-30 | Disposition: A | Discharge status: Home / Self Care | Payer: Medicaid Other | Attending: Emergency Medicine | Admitting: Emergency Medicine

## 2020-10-30 ENCOUNTER — Emergency Department (HOSPITAL_COMMUNITY): Payer: Medicaid Other

## 2020-10-30 ENCOUNTER — Encounter (HOSPITAL_COMMUNITY): Payer: Self-pay | Admitting: Emergency Medicine

## 2020-10-30 ENCOUNTER — Other Ambulatory Visit: Payer: Self-pay

## 2020-10-30 DIAGNOSIS — Z794 Long term (current) use of insulin: Secondary | ICD-10-CM | POA: Insufficient documentation

## 2020-10-30 DIAGNOSIS — A0472 Enterocolitis due to Clostridium difficile, not specified as recurrent: Secondary | ICD-10-CM | POA: Insufficient documentation

## 2020-10-30 DIAGNOSIS — E039 Hypothyroidism, unspecified: Secondary | ICD-10-CM | POA: Diagnosis not present

## 2020-10-30 DIAGNOSIS — R1084 Generalized abdominal pain: Secondary | ICD-10-CM

## 2020-10-30 DIAGNOSIS — I10 Essential (primary) hypertension: Secondary | ICD-10-CM | POA: Diagnosis not present

## 2020-10-30 DIAGNOSIS — Z87891 Personal history of nicotine dependence: Secondary | ICD-10-CM | POA: Diagnosis not present

## 2020-10-30 DIAGNOSIS — E119 Type 2 diabetes mellitus without complications: Secondary | ICD-10-CM | POA: Insufficient documentation

## 2020-10-30 DIAGNOSIS — Z28310 Unvaccinated for covid-19: Secondary | ICD-10-CM | POA: Diagnosis not present

## 2020-10-30 DIAGNOSIS — Z7984 Long term (current) use of oral hypoglycemic drugs: Secondary | ICD-10-CM | POA: Diagnosis not present

## 2020-10-30 DIAGNOSIS — Z79899 Other long term (current) drug therapy: Secondary | ICD-10-CM | POA: Insufficient documentation

## 2020-10-30 DIAGNOSIS — R109 Unspecified abdominal pain: Secondary | ICD-10-CM | POA: Diagnosis present

## 2020-10-30 LAB — CBC
HCT: 45.7 % (ref 39.0–52.0)
Hemoglobin: 15.8 g/dL (ref 13.0–17.0)
MCH: 30.4 pg (ref 26.0–34.0)
MCHC: 34.6 g/dL (ref 30.0–36.0)
MCV: 87.9 fL (ref 80.0–100.0)
Platelets: 258 10*3/uL (ref 150–400)
RBC: 5.2 MIL/uL (ref 4.22–5.81)
RDW: 12.3 % (ref 11.5–15.5)
WBC: 6.2 10*3/uL (ref 4.0–10.5)
nRBC: 0 % (ref 0.0–0.2)

## 2020-10-30 LAB — C DIFFICILE QUICK SCREEN W PCR REFLEX
C Diff antigen: POSITIVE — AB
C Diff toxin: NEGATIVE

## 2020-10-30 LAB — COMPREHENSIVE METABOLIC PANEL
ALT: 44 U/L (ref 0–44)
AST: 29 U/L (ref 15–41)
Albumin: 4.2 g/dL (ref 3.5–5.0)
Alkaline Phosphatase: 92 U/L (ref 38–126)
Anion gap: 7 (ref 5–15)
BUN: 8 mg/dL (ref 6–20)
CO2: 25 mmol/L (ref 22–32)
Calcium: 9.2 mg/dL (ref 8.9–10.3)
Chloride: 105 mmol/L (ref 98–111)
Creatinine, Ser: 0.69 mg/dL (ref 0.61–1.24)
GFR, Estimated: >60 mL/min (ref >60)
Glucose, Bld: 210 mg/dL — ABNORMAL HIGH (ref 70–99)
Potassium: 3.8 mmol/L (ref 3.5–5.1)
Sodium: 137 mmol/L (ref 135–145)
Total Bilirubin: 1.3 mg/dL — ABNORMAL HIGH (ref 0.3–1.2)
Total Protein: 7.2 g/dL (ref 6.5–8.1)

## 2020-10-30 LAB — URINALYSIS, ROUTINE W REFLEX MICROSCOPIC
Bacteria, UA: NONE SEEN
Bilirubin Urine: NEGATIVE
Glucose, UA: >=500 mg/dL — AB
Hgb urine dipstick: NEGATIVE
Ketones, ur: NEGATIVE mg/dL
Leukocytes,Ua: NEGATIVE
Nitrite: NEGATIVE
Protein, ur: 30 mg/dL — AB
Specific Gravity, Urine: 1.025 (ref 1.005–1.030)
pH: 5 (ref 5.0–8.0)

## 2020-10-30 LAB — LIPASE, BLOOD: Lipase: 24 U/L (ref 11–51)

## 2020-10-30 MED ORDER — DICYCLOMINE HCL 10 MG/ML IM SOLN
20.0000 mg | Freq: Once | INTRAMUSCULAR | Status: AC
  Administered 2020-10-30: 20 mg via INTRAMUSCULAR
  Filled 2020-10-30: qty 2

## 2020-10-30 MED ORDER — IOHEXOL 300 MG/ML  SOLN
100.0000 mL | Freq: Once | INTRAMUSCULAR | Status: AC | PRN
  Administered 2020-10-30: 100 mL via INTRAVENOUS

## 2020-10-30 MED ORDER — DICYCLOMINE HCL 20 MG PO TABS: 20.0000 mg | ORAL_TABLET | Freq: Two times a day (BID) | ORAL | 0 refills | Status: DC | PRN

## 2020-10-30 MED ORDER — VANCOMYCIN HCL 125 MG PO CAPS: 125.0000 mg | ORAL_CAPSULE | Freq: Four times a day (QID) | ORAL | 0 refills | Status: AC

## 2020-10-30 MED ORDER — VANCOMYCIN HCL 125 MG PO CAPS
125.0000 mg | ORAL_CAPSULE | Freq: Once | ORAL | Status: AC
  Administered 2020-10-30: 125 mg via ORAL
  Filled 2020-10-30 (×2): qty 1

## 2020-10-30 NOTE — Discharge Instructions (Addendum)
You have a bacterial infection in your abdomen causing have diarrhea.  I start you on antibiotics please take as prescribed.  Also given you a medication called Bentyl this will help with stomach spasms.  Please be aware the aware that C. difficile can be given to other people please remember to wash to wash her hands frequently especially after bowel movements.  Please follow-up with PCP for further evaluation 10-day time.  Come back to the emergency department if you have severe or worsening abdominal pain, fevers, chills, uncontrolled nausea, vomiting, worsening diarrhea as you will need further evaluation.

## 2020-10-30 NOTE — ED Triage Notes (Signed)
Pt reports abdominal pain and diarrhea x 3 days. 

## 2020-10-30 NOTE — ED Provider Notes (Signed)
[facility] EMERGENCY DEPARTMENT Provider Note   CSN: [id number] Arrival date & time: 10/30/20  1603     History Chief Complaint  Patient presents with   Abdominal Pain    Nathan [name] is a 45 y.o. male.  HPI  Patient with significant medical history of cardiac arrest, diabetes, hypertension, thyroid disease presents to the emergency department with chief complaint of stomach pain.  Patient states pain started about 3 days ago, came on suddenly, states he has pain all around his abdomen, does not radiate, has associated diarrhea, states he has had multiple episodes of diarrhea states he comes every 15 minutes, states that it has a foul smell to it, denies any mucus in no melena or hematochezia.  He has no significant abdominal history, denies history of bowel obstructions, PUD, NSAID use, alcohol use, diverticulitis, denies recent hospitalizations, states he has been on antibiotics but this was over a month ago.  No history of C. difficile.  He denies  associated fevers, chills, nasal congestion, sore throat, cough, chest pain, general body aches, he is not vaccinated COVID-19.  He denies  alleviating factors.  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   Primary hypothyroidism 11/03/2014   Mixed hyperlipidemia 11/03/2014   Chest pain    HTN (hypertension)    HLD (hyperlipidemia)     Past Surgical History:  Procedure Laterality Date   CARDIAC  CATHETERIZATION     no blockage   HAND SURGERY         Family History  Problem Relation Age of Onset   Heart failure Mother    Diabetes Mother    Heart attack Father        x5    Diabetes Father     Social History   Tobacco Use   Smoking status: Former    Years: 6.00    Types: Cigarettes    Quit date: 01/25/2008    Years since quitting: 12.7   Smokeless tobacco: Never  Substance Use Topics   Alcohol use: Not Currently    Alcohol/week: 4.0 standard drinks    Types: 4 Cans of beer per week    Comment: 3-4 beers weekly   Drug use: No    Home Medications Prior to Admission medications   Medication Sig Start Date End Date Taking? Authorizing Provider  amLODipine (NORVASC) 10 MG tablet Take 1 tablet (10 mg total) by mouth daily. 02/28/17  Yes Nida, Marella Chimes, MD  dicyclomine (BENTYL) 20 MG tablet Take 1 tablet (20 mg total) by mouth 2 (two) times daily as needed for spasms. 10/30/20  Yes Marcello Fennel, PA-C  empagliflozin (JARDIANCE) 25 MG TABS tablet Take 1 tablet (25 mg total) by mouth daily before breakfast. 02/06/20  Yes Renato Shin, MD  levothyroxine (EUTHYROX) 88 MCG tablet Take 1 tablet (88 mcg total) by mouth daily before breakfast. 06/18/20  Yes Renato Shin, MD  vancomycin (  VANCOCIN) 125 MG capsule Take 1 capsule (125 mg total) by mouth 4 (four) times daily for 10 days. 10/30/20 11/09/20 Yes Marcello Fennel, PA-C  cyclobenzaprine (FLEXERIL) 10 MG tablet Take 1 tablet (10 mg total) by mouth 3 (three) times daily. Patient not taking: No sig reported 05/25/18   Lily Kocher, PA-C  Dulaglutide (TRULICITY) 0.24 OX/7.3ZH SOPN Inject 0.75 mg into the skin once a week. Patient not taking: No sig reported 02/06/20   Renato Shin, MD  insulin glargine, 2 Unit Dial, (TOUJEO MAX SOLOSTAR) 300 UNIT/ML Solostar Pen Inject 220 Units into the skin every morning. Patient not taking: No sig reported 06/18/20   Renato Shin, MD  naproxen (NAPROSYN) 500 MG tablet Take 1  tablet (500 mg total) by mouth 2 (two) times daily. Patient not taking: Reported on 10/30/2020 10/11/17   Milton Ferguson, MD  RELION PEN NEEDLES 31G X 6 MM MISC USE  ONCE DAILY 06/28/19   Renato Shin, MD  tiZANidine (ZANAFLEX) 4 MG tablet Take 1 tablet by mouth every 6 (six) hours as needed. Patient not taking: Reported on 10/30/2020 05/22/18   [provider]  traMADol (ULTRAM) 50 MG tablet Take 1 tablet (50 mg total) by mouth every 6 (six) hours as needed. Patient not taking: No sig reported 05/25/18   Lily Kocher, PA-C    Allergies    Benzocaine, Orange juice [orange oil], Flavoring agent, and Grapefruit concentrate  Review of Systems   Review of Systems  Constitutional:  Negative for chills and fever.  HENT:  Negative for congestion.   Respiratory:  Negative for shortness of breath.   Cardiovascular:  Negative for chest pain.  Gastrointestinal:  Positive for abdominal pain and diarrhea. Negative for blood in stool, nausea and vomiting.  Genitourinary:  Negative for enuresis.  Musculoskeletal:  Negative for back pain.  Skin:  Negative for rash.  Neurological:  Negative for dizziness and headaches.  Hematological:  Does not bruise/bleed easily.   Physical Exam Updated Vital Signs BP (!) 142/94   Pulse 79   Temp 98.2 F (36.8 C) (Oral)   Resp 16   SpO2 100%   Physical Exam Vitals and nursing note reviewed.  Constitutional:      General: He is not in acute distress.    Appearance: He is not ill-appearing.  HENT:     Head: Normocephalic and atraumatic.     Nose: No congestion.  Eyes:     Conjunctiva/sclera: Conjunctivae normal.  Cardiovascular:     Rate and Rhythm: Normal rate and regular rhythm.     Pulses: Normal pulses.     Heart sounds: No murmur heard.   No friction rub. No gallop.  Pulmonary:     Effort: No respiratory distress.     Breath sounds: No wheezing, rhonchi or rales.  Abdominal:     Palpations: Abdomen is soft.     Tenderness: There is  abdominal tenderness. There is no right CVA tenderness or left CVA tenderness.     Comments: Abdomen nondistended, normal bowel sounds, dull to percussion, he has generalized stomach tenderness, does not focalized, no guarding, rebound tenderness, peritoneal sign.  Musculoskeletal:     Right lower leg: No edema.     Left lower leg: No edema.  Skin:    General: Skin is warm and dry.  Neurological:     Mental Status: He is alert.  Psychiatric:        Mood and Affect: Mood normal.    ED Results / Procedures /  Treatments   Labs (all labs ordered are listed, but only abnormal results are displayed) Labs Reviewed  C DIFFICILE QUICK SCREEN W PCR REFLEX   - Abnormal; Notable for the following components:      Result Value   C Diff antigen POSITIVE (*)    All other components within normal limits  COMPREHENSIVE METABOLIC PANEL - Abnormal; Notable for the following components:   Glucose, Bld 210 (*)    Total Bilirubin 1.3 (*)    All other components within normal limits  URINALYSIS, ROUTINE W REFLEX MICROSCOPIC - Abnormal; Notable for the following components:   Glucose, UA >=500 (*)    Protein, ur 30 (*)    All other components within normal limits  GASTROINTESTINAL PANEL BY PCR, STOOL (REPLACES STOOL CULTURE)  CLOSTRIDIUM DIFFICILE BY PCR, REFLEXED  LIPASE, BLOOD  CBC    EKG None  Radiology CT ABDOMEN PELVIS W CONTRAST  Result Date: 10/30/2020 CLINICAL DATA:  Acute abdominal pain and diarrhea for 3 days. History type II diabetes mellitus, hypertension, former smoker EXAM: CT ABDOMEN AND PELVIS WITH CONTRAST TECHNIQUE: Multidetector CT imaging of the abdomen and pelvis was performed using the standard protocol following bolus administration of intravenous contrast. Sagittal and coronal MPR images reconstructed from axial data set. CONTRAST:  163m OMNIPAQUE IOHEXOL 300 MG/ML SOLN IV. No oral contrast. COMPARISON:  05/25/2018 FINDINGS: Lower chest: Minimal dependent atelectasis at lung  bases Hepatobiliary: Gallbladder and liver normal appearance Pancreas: Normal appearance Spleen: Normal appearance Adrenals/Urinary Tract: Adrenal glands, kidneys, ureters, and bladder normal appearance Stomach/Bowel: Normal appendix. Stomach normal appearance. Small bowel loops normal appearance. Patchy wall thickening of colon including rectosigmoid colon and transverse colon through splenic flexure consistent with colitis. No evidence of obstruction or perforation. Vascular/Lymphatic: Vascular structures patent. Aorta normal caliber. No adenopathy. Hyperemia of transverse mesocolon. Reproductive: Unremarkable prostate gland and seminal vesicles Other: Tiny umbilical hernia containing fat. No free air or free fluid. Musculoskeletal: Unremarkable IMPRESSION: Colitis involving the rectosigmoid colon and transverse colon through splenic flexure. Differential diagnosis favors inflammatory bowel disease or infectious colitis. Tiny umbilical hernia containing fat. Remainder of exam unremarkable. Electronically Signed   By: MLavonia DanaM.D.   On: 10/30/2020 21:05    Procedures Procedures   Medications Ordered in ED Medications  vancomycin (VANCOCIN) capsule 125 mg (has no administration in time range)  dicyclomine (BENTYL) injection 20 mg (20 mg Intramuscular Given 10/30/20 2038)  iohexol (OMNIPAQUE) 300 MG/ML solution 100 mL (100 mLs Intravenous Contrast Given 10/30/20 2035)    ED Course  I have reviewed the triage vital signs and the nursing notes.  Pertinent labs & imaging results that were available during my care of the patient were reviewed by me and considered in my medical decision making (see chart for details).    MDM Rules/Calculators/A&P                          Initial impression-patient presents with stomach pain and diarrhea.  He is alert, does not appear in acute stress, vital signs reassuring.  Triage obtain basic lab work-up, will add on C. difficile as well as gastrointestinal panel,  as well as CT abdomen pelvis.  Work-up-CBC unremarkable, CMP shows hyperglycemia 210, lipase 24, UA negative for nitrates, leukocytes, hematuria.  C. difficile quick screen shows positive antigens negative toxins interpretations pending.  CT abdomen pelvis reveals colitis involving the rectosigmoid colon and transverse colon, differential diagnosis favors inflammatory bowel and/or infectious colitis.  Reassessment-patient is  reassessed after Bentyl states he is feeling much better, has no complaints this time.  Updated patient on lab work for him that he most likely has C. difficile which would make sense as he was on a long course of antibiotics 1 month ago.  We will give 1 dose of antibiotics in the emergency department and sent him home with prescription.  Rule out-low suspicion for systemic infection as patient is nontoxic-appearing, vital signs are reassuring, no leukocytosis present.  Low suspicion for liver or gallbladder abnormality as liver enzymes, alk phos all within normal limits, no right upper quadrant tenderness present.  Low suspicion for bowel obstruction as patient still passing gas, having normal bowel movements, abdomen nondistended on exam.  Low suspicion for, acute diverticulitis and or intra-abdominal infection as CT imaging is negative for this.  CT imaging shows colitis inflammatory versus infectious I suspect this is infectious as he has a positive C. difficile antigen will treat him as he has mild symptoms multiple episodes of diarrhea x3 days.  Plan-  Abdominal pain and diarrhea-likely secondary due to C. difficile which I suspect was from antibiotic use.   will start him on vancomycin, given strict return precautions, follow-up with PCP in 10 days time for reevaluation.  Vital signs have remained stable, no indication for hospital admission.  Patient discussed with attending and they agreed with assessment and plan.  Patient given at home care as well strict return  precautions.  Patient verbalized that they understood agreed to said plan.  Final Clinical Impression(s) / ED Diagnoses Final diagnoses:  Generalized abdominal pain  C. difficile colitis    Rx / DC Orders ED Discharge Orders          Ordered    vancomycin (VANCOCIN) 125 MG capsule  4 times daily        10/30/20 2210    dicyclomine (BENTYL) 20 MG tablet  2 times daily PRN        10/30/20 2210             Marcello Fennel, PA-C 10/30/20 2212    Isla Pence, MD 10/30/20 2232

## 2020-10-31 LAB — CLOSTRIDIUM DIFFICILE BY PCR, REFLEXED: Toxigenic C. Difficile by PCR: NEGATIVE

## 2020-11-01 LAB — GASTROINTESTINAL PANEL BY PCR, STOOL (REPLACES STOOL CULTURE)

## 2021-01-21 ENCOUNTER — Other Ambulatory Visit: Payer: Self-pay | Admitting: Endocrinology

## 2021-01-21 DIAGNOSIS — E1159 Type 2 diabetes mellitus with other circulatory complications: Secondary | ICD-10-CM

## 2021-01-23 ENCOUNTER — Emergency Department (HOSPITAL_COMMUNITY)
Admission: EM | Admit: 2021-01-23 | Discharge: 2021-01-23 | Disposition: A | Discharge status: Home / Self Care | Payer: Medicaid Other | Attending: Emergency Medicine | Admitting: Emergency Medicine

## 2021-01-23 ENCOUNTER — Encounter (HOSPITAL_COMMUNITY): Payer: Self-pay

## 2021-01-23 ENCOUNTER — Emergency Department (HOSPITAL_COMMUNITY): Payer: Medicaid Other

## 2021-01-23 DIAGNOSIS — E119 Type 2 diabetes mellitus without complications: Secondary | ICD-10-CM | POA: Insufficient documentation

## 2021-01-23 DIAGNOSIS — R0602 Shortness of breath: Secondary | ICD-10-CM | POA: Diagnosis not present

## 2021-01-23 DIAGNOSIS — Z87891 Personal history of nicotine dependence: Secondary | ICD-10-CM | POA: Diagnosis not present

## 2021-01-23 DIAGNOSIS — Z794 Long term (current) use of insulin: Secondary | ICD-10-CM | POA: Diagnosis not present

## 2021-01-23 DIAGNOSIS — I10 Essential (primary) hypertension: Secondary | ICD-10-CM | POA: Diagnosis not present

## 2021-01-23 DIAGNOSIS — E039 Hypothyroidism, unspecified: Secondary | ICD-10-CM | POA: Diagnosis not present

## 2021-01-23 DIAGNOSIS — Z79899 Other long term (current) drug therapy: Secondary | ICD-10-CM | POA: Insufficient documentation

## 2021-01-23 DIAGNOSIS — R0789 Other chest pain: Secondary | ICD-10-CM | POA: Diagnosis not present

## 2021-01-23 DIAGNOSIS — R079 Chest pain, unspecified: Secondary | ICD-10-CM | POA: Diagnosis present

## 2021-01-23 LAB — HEPATIC FUNCTION PANEL
ALT: 37 U/L (ref 0–44)
AST: 23 U/L (ref 15–41)
Albumin: 4.3 g/dL (ref 3.5–5.0)
Alkaline Phosphatase: 95 U/L (ref 38–126)
Bilirubin, Direct: 0.1 mg/dL (ref 0.0–0.2)
Indirect Bilirubin: 1 mg/dL — ABNORMAL HIGH (ref 0.3–0.9)
Total Bilirubin: 1.1 mg/dL (ref 0.3–1.2)
Total Protein: 7.6 g/dL (ref 6.5–8.1)

## 2021-01-23 LAB — CBC
HCT: 47 % (ref 39.0–52.0)
Hemoglobin: 16.4 g/dL (ref 13.0–17.0)
MCH: 29.8 pg (ref 26.0–34.0)
MCHC: 34.9 g/dL (ref 30.0–36.0)
MCV: 85.3 fL (ref 80.0–100.0)
Platelets: 229 10*3/uL (ref 150–400)
RBC: 5.51 MIL/uL (ref 4.22–5.81)
RDW: 11.9 % (ref 11.5–15.5)
WBC: 4.1 10*3/uL (ref 4.0–10.5)
nRBC: 0 % (ref 0.0–0.2)

## 2021-01-23 LAB — CBG MONITORING, ED: Glucose-Capillary: 238 mg/dL — ABNORMAL HIGH (ref 70–99)

## 2021-01-23 LAB — TROPONIN I (HIGH SENSITIVITY)
Troponin I (High Sensitivity): 2 ng/L (ref <18)
Troponin I (High Sensitivity): <2 ng/L (ref <18)

## 2021-01-23 LAB — BASIC METABOLIC PANEL
Anion gap: 12 (ref 5–15)
BUN: 11 mg/dL (ref 6–20)
CO2: 26 mmol/L (ref 22–32)
Calcium: 9.4 mg/dL (ref 8.9–10.3)
Chloride: 97 mmol/L — ABNORMAL LOW (ref 98–111)
Creatinine, Ser: 0.58 mg/dL — ABNORMAL LOW (ref 0.61–1.24)
GFR, Estimated: >60 mL/min (ref >60)
Glucose, Bld: 230 mg/dL — ABNORMAL HIGH (ref 70–99)
Potassium: 3.8 mmol/L (ref 3.5–5.1)
Sodium: 135 mmol/L (ref 135–145)

## 2021-01-23 MED ORDER — MORPHINE SULFATE (PF) 4 MG/ML IV SOLN
4.0000 mg | Freq: Once | INTRAVENOUS | Status: DC
  Filled 2021-01-23: qty 1

## 2021-01-23 MED ORDER — NITROGLYCERIN 0.4 MG SL SUBL
0.4000 mg | SUBLINGUAL_TABLET | Freq: Once | SUBLINGUAL | Status: AC
  Administered 2021-01-23: 0.4 mg via SUBLINGUAL
  Filled 2021-01-23: qty 1

## 2021-01-23 NOTE — ED Triage Notes (Signed)
Pt presents with midsternal CP radiating to his L shoulder and SOB. Pt states he has cardiac hx and was cardiac arrest survivor ten years ago. Of note during triage pt states that he has not been able to receive his insulin at home for this T2DM and his CBG has recently been 500-600 for two weeks.

## 2021-01-23 NOTE — ED Notes (Signed)
[id number]  

## 2021-01-23 NOTE — Discharge Instructions (Addendum)
Follow-up with Dr. Wyline Mood or another cardiologist here this week.  Return sooner if problem

## 2021-01-23 NOTE — ED Notes (Signed)
Pt reports CP "easing off a little bit". Denies complete resolution to pain. Pt refused second dose of Nitro.

## 2021-01-23 NOTE — ED Provider Notes (Signed)
[facility] EMERGENCY DEPARTMENT Provider Note   CSN: [id number] Arrival date & time: 01/23/21  [id number]     History Chief Complaint  Patient presents with   Chest Pain   Shortness of Breath    Nathan [name] is a 45 y.o. male.  Patient states he had some chest pain earlier today that has resolved  The history is provided by the patient and medical records. No language interpreter was used.  Chest Pain Pain location:  L chest Pain quality: aching   Pain radiates to:  Does not radiate Pain severity:  Moderate Onset quality:  Sudden Timing:  Constant Progression:  Worsening Chronicity:  New Context: not breathing   Relieved by:  Nothing Worsened by:  Nothing Associated symptoms: shortness of breath   Associated symptoms: no abdominal pain, no back pain, no cough, no fatigue and no headache   Shortness of Breath Associated symptoms: chest pain   Associated symptoms: no abdominal pain, no cough, no headaches and no rash       Past Medical History:  Diagnosis Date   Cardiac arrest ([place])    Chest pain    Diabetes mellitus    HLD (hyperlipidemia)    Hypertension    Obesity    Pneumonia    Syncope    Thyroid disease    TMJ (dislocation of temporomandibular joint)     Patient Active Problem List   Diagnosis Date Noted   Low testosterone 02/06/2020   Erectile dysfunction 01/07/2020   Personal history of noncompliance with medical treatment, presenting hazards to health 04/08/2016   Normal coronary arteries 09/04/2015   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   Primary hypothyroidism 11/03/2014   Mixed hyperlipidemia 11/03/2014   Chest pain    HTN (hypertension)    HLD (hyperlipidemia)     Past Surgical History:  Procedure Laterality Date   CARDIAC CATHETERIZATION     no blockage   HAND SURGERY         Family History  Problem Relation Age of Onset   Heart failure Mother    Diabetes Mother    Heart attack Father        x5     Diabetes Father     Social History   Tobacco Use   Smoking status: Former    Years: 6.00    Types: Cigarettes    Quit date: 01/25/2008    Years since quitting: 13.0   Smokeless tobacco: Never  Substance Use Topics   Alcohol use: Not Currently    Alcohol/week: 4.0 standard drinks    Types: 4 Cans of beer per week    Comment: 3-4 beers weekly   Drug use: No    Home Medications Prior to Admission medications   Medication Sig Start Date End Date Taking? Authorizing Provider  amLODipine (NORVASC) 10 MG tablet Take 1 tablet (10 mg total) by mouth daily. 02/28/17   Cassandria Anger, MD  cyclobenzaprine (FLEXERIL) 10 MG tablet Take 1 tablet (10 mg total) by mouth 3 (three) times daily. Patient not taking: No sig reported 05/25/18   Lily Kocher, PA-C  dicyclomine (BENTYL) 20 MG tablet Take 1 tablet (20 mg total) by mouth 2 (two) times daily as needed for spasms. 10/30/20   Marcello Fennel, PA-C  Dulaglutide (TRULICITY) [id number] SOPN Inject 0.75 mg into the skin once a week. Patient not taking: No sig reported 02/06/20   Renato Shin, MD  empagliflozin (JARDIANCE) 25 MG TABS tablet Take 1  tablet (25 mg total) by mouth daily before breakfast. 02/06/20   Renato Shin, MD  insulin glargine, 2 Unit Dial, (TOUJEO MAX SOLOSTAR) 300 UNIT/ML Solostar Pen Inject 220 Units into the skin every morning. Patient not taking: No sig reported 06/18/20   Renato Shin, MD  levothyroxine (EUTHYROX) 88 MCG tablet Take 1 tablet (88 mcg total) by mouth daily before breakfast. 06/18/20   Renato Shin, MD  naproxen (NAPROSYN) 500 MG tablet Take 1 tablet (500 mg total) by mouth 2 (two) times daily. Patient not taking: Reported on 10/30/2020 10/11/17   Milton Ferguson, MD  RELION PEN NEEDLES 31G X 6 MM MISC USE  ONCE DAILY 06/28/19   Renato Shin, MD  tiZANidine (ZANAFLEX) 4 MG tablet Take 1 tablet by mouth every 6 (six) hours as needed. Patient not taking: Reported on 10/30/2020 05/22/18   [provider]  traMADol (ULTRAM) 50 MG tablet Take 1 tablet (50 mg total) by mouth every 6 (six) hours as needed. Patient not taking: No sig reported 05/25/18   Lily Kocher, PA-C    Allergies    Benzocaine, Orange juice [orange oil], Flavoring agent, and Grapefruit concentrate  Review of Systems   Review of Systems  Constitutional:  Negative for appetite change and fatigue.  HENT:  Negative for congestion, ear discharge and sinus pressure.   Eyes:  Negative for discharge.  Respiratory:  Positive for shortness of breath. Negative for cough.   Cardiovascular:  Positive for chest pain.  Gastrointestinal:  Negative for abdominal pain and diarrhea.  Genitourinary:  Negative for frequency and hematuria.  Musculoskeletal:  Negative for back pain.  Skin:  Negative for rash.  Neurological:  Negative for seizures and headaches.  Psychiatric/Behavioral:  Negative for hallucinations.    Physical Exam Updated Vital Signs BP (!) 129/91    Pulse 72    Temp 97.7 F (36.5 C) (Oral)    Resp (!) 22    Ht 5\' 11"  (1.803 m)    Wt 97.5 kg    SpO2 98%    BMI 29.99 kg/m   Physical Exam Vitals and nursing note reviewed.  Constitutional:      Appearance: He is well-developed.  HENT:     Head: Normocephalic.     Nose: Nose normal.  Eyes:     General: No scleral icterus.    Conjunctiva/sclera: Conjunctivae normal.  Neck:     Thyroid: No thyromegaly.  Cardiovascular:     Rate and Rhythm: Normal rate and regular rhythm.     Heart sounds: No murmur heard.   No friction rub. No gallop.  Pulmonary:     Breath sounds: No stridor. No wheezing or rales.  Chest:     Chest wall: No tenderness.  Abdominal:     General: There is no distension.     Tenderness: There is no abdominal tenderness. There is no rebound.  Musculoskeletal:        General: Normal range of motion.     Cervical back: Neck supple.  Lymphadenopathy:     Cervical: No cervical adenopathy.  Skin:    Findings: No erythema or rash.   Neurological:     Mental Status: He is alert and oriented to person, place, and time.     Motor: No abnormal muscle tone.     Coordination: Coordination normal.  Psychiatric:        Behavior: Behavior normal.    ED Results / Procedures / Treatments   Labs (all labs ordered are listed, but  only abnormal results are displayed) Labs Reviewed  BASIC METABOLIC PANEL - Abnormal; Notable for the following components:      Result Value   Chloride 97 (*)    Glucose, Bld 230 (*)    Creatinine, Ser 0.58 (*)    All other components within normal limits  HEPATIC FUNCTION PANEL - Abnormal; Notable for the following components:   Indirect Bilirubin 1.0 (*)    All other components within normal limits  CBG MONITORING, ED - Abnormal; Notable for the following components:   Glucose-Capillary 238 (*)    All other components within normal limits  CBC  TROPONIN I (HIGH SENSITIVITY)  TROPONIN I (HIGH SENSITIVITY)    EKG EKG Interpretation  Date/Time:  Saturday January 23 2021 09:42:50 EST Ventricular Rate:  89 PR Interval:  143 QRS Duration: 88 QT Interval:  356 QTC Calculation: 434 R Axis:   3 Text Interpretation: Sinus rhythm Probable anteroseptal infarct, old Confirmed by Bethann Berkshire [phone]) on 01/23/2021 9:54:25 AM  Radiology DG Chest 2 View  Result Date: 01/23/2021 CLINICAL DATA:  Chest pain starting this morning. EXAM: CHEST - 2 VIEW COMPARISON:  October 14, 2019 FINDINGS: The heart size and mediastinal contours are within normal limits. Both lungs are clear. The visualized skeletal structures are unremarkable. IMPRESSION: No active cardiopulmonary disease. Electronically Signed   By: Sherian Rein M.D.   On: 01/23/2021 10:45    Procedures Procedures   Medications Ordered in ED Medications  morphine 4 MG/ML injection 4 mg (4 mg Intravenous Patient Refused/Not Given 01/23/21 1014)  nitroGLYCERIN (NITROSTAT) SL tablet 0.4 mg (0.4 mg Sublingual Given 01/23/21 1014)    ED  Course  I have reviewed the triage vital signs and the nursing notes.  Pertinent labs & imaging results that were available during my care of the patient were reviewed by me and considered in my medical decision making (see chart for details).    MDM Rules/Calculators/A&P                         Patient with atypical chest pain with normal enzymes and EKG.  He will follow-up with his cardiologist this week    Final Clinical Impression(s) / ED Diagnoses Final diagnoses:  Atypical chest pain    Rx / DC Orders ED Discharge Orders     None        Bethann Berkshire, MD 01/26/21 [phone]

## 2021-01-29 ENCOUNTER — Ambulatory Visit (INDEPENDENT_AMBULATORY_CARE_PROVIDER_SITE_OTHER): Payer: Medicaid Other | Admitting: Endocrinology

## 2021-01-29 ENCOUNTER — Telehealth: Payer: Self-pay

## 2021-01-29 ENCOUNTER — Other Ambulatory Visit: Payer: Self-pay

## 2021-01-29 ENCOUNTER — Other Ambulatory Visit (HOSPITAL_COMMUNITY): Payer: Self-pay

## 2021-01-29 VITALS — BP 126/82 | HR 92 | Ht 71.0 in | Wt 214.2 lb

## 2021-01-29 DIAGNOSIS — E1159 Type 2 diabetes mellitus with other circulatory complications: Secondary | ICD-10-CM

## 2021-01-29 LAB — POCT GLYCOSYLATED HEMOGLOBIN (HGB A1C): Hemoglobin A1C: 10.6 % — AB (ref 4.0–5.6)

## 2021-01-29 MED ORDER — TRESIBA FLEXTOUCH 200 UNIT/ML ~~LOC~~ SOPN: 100.0000 [IU] | PEN_INJECTOR | Freq: Every day | SUBCUTANEOUS | 3 refills | Status: DC

## 2021-01-29 MED ORDER — VICTOZA 18 MG/3ML ~~LOC~~ SOPN: 0.6000 mg | PEN_INJECTOR | Freq: Every day | SUBCUTANEOUS | 3 refills | Status: DC

## 2021-01-29 NOTE — Progress Notes (Signed)
Subjective:    Patient ID: Nathan [name], male    DOB: [birth year], 46 y.o.   MRN: [id number]  HPI Pt returns for f/u of diabetes mellitus:   DM type: Insulin-requiring type 2. Dx'ed: [id number] Complications: PN.  Therapy: insulin since 2010, Ozempic, and Jardiance.     DKA: never Severe hypoglycemia: never.   Pancreatitis: never Pancreatic imaging: normal on 2012 CT.   SDOH: he is back in school.   Other: he declines multiple daily injections.   Interval history: pt states he feels well in general.  He was off insulin x 1 year.   Back on Tresiba 100 units/d, x 3 days, cbg has improved to the 100's).   Past Medical History:  Diagnosis Date   Cardiac arrest ([place])    Chest pain    Diabetes mellitus    HLD (hyperlipidemia)    Hypertension    Obesity    Pneumonia    Syncope    Thyroid disease    TMJ (dislocation of temporomandibular joint)     Past Surgical History:  Procedure Laterality Date   CARDIAC CATHETERIZATION     no blockage   HAND SURGERY      Social History   Socioeconomic History   Marital status: Married    Spouse name: Not on file   Number of children: Not on file   Years of education: Not on file   Highest education level: Not on file  Occupational History   Not on file  Tobacco Use   Smoking status: Former    Years: 6.00    Types: Cigarettes    Quit date: 01/25/2008    Years since quitting: 13.0   Smokeless tobacco: Never  Substance and Sexual Activity   Alcohol use: Not Currently    Alcohol/week: 4.0 standard drinks    Types: 4 Cans of beer per week    Comment: 3-4 beers weekly   Drug use: No   Sexual activity: Not on file  Other Topics Concern   Not on file  Social History Narrative   Not on file   Social Determinants of Health   Financial Resource Strain: Not on file  Food Insecurity: Not on file  Transportation Needs: Not on file  Physical Activity: Not on file  Stress: Not on file  Social Connections: Not on file  Intimate  Partner Violence: Not on file    Current Outpatient Medications on File Prior to Visit  Medication Sig Dispense Refill   amLODipine (NORVASC) 10 MG tablet Take 1 tablet (10 mg total) by mouth daily. 30 tablet 3   cyclobenzaprine (FLEXERIL) 10 MG tablet Take 1 tablet (10 mg total) by mouth 3 (three) times daily. 20 tablet 0   dicyclomine (BENTYL) 20 MG tablet Take 1 tablet (20 mg total) by mouth 2 (two) times daily as needed for spasms. 20 tablet 0   empagliflozin (JARDIANCE) 25 MG TABS tablet Take 1 tablet (25 mg total) by mouth daily before breakfast. 90 tablet 3   levothyroxine (EUTHYROX) 88 MCG tablet Take 1 tablet (88 mcg total) by mouth daily before breakfast. 90 tablet 0   naproxen (NAPROSYN) 500 MG tablet Take 1 tablet (500 mg total) by mouth 2 (two) times daily. 30 tablet 0   RELION PEN NEEDLES 31G X 6 MM MISC USE  ONCE DAILY 50 each 2   tiZANidine (ZANAFLEX) 4 MG tablet Take 1 tablet by mouth every 6 (six) hours as needed.     traMADol (ULTRAM) 50 MG  tablet Take 1 tablet (50 mg total) by mouth every 6 (six) hours as needed. 12 tablet 0   No current facility-administered medications on file prior to visit.    Allergies  Allergen Reactions   Benzocaine Anaphylaxis and Other (See Comments)    REACTION: Blistering all over and throat swells shut   Orange Juice [Orange Oil] Anaphylaxis   Flavoring Agent    Grapefruit Concentrate     Family History  Problem Relation Age of Onset   Heart failure Mother    Diabetes Mother    Heart attack Father        x5    Diabetes Father     BP 126/82    Pulse 92    Ht 5\' 11"  (1.803 m)    Wt 214 lb 3.2 oz (97.2 kg)    SpO2 98%    BMI 29.87 kg/m    Review of Systems Denies n/v/sob.      Objective:   Physical Exam    Lab Results  Component Value Date   HGBA1C 10.6 (A) 01/29/2021      Assessment & Plan:  Insulin-requiring type 2 DM: uncontrolled.    Patient Instructions  Please continue the same Tresiba I have sent a  prescription to your pharmacy, to add Victoza. check your blood sugar twice a day.  vary the time of day when you check, between before the 3 meals, and at bedtime.  also check if you have symptoms of your blood sugar being too high or too low.  please keep a record of the readings and bring it to your next appointment here (or you can bring the meter itself).  You can write it on any piece of paper.  please call us sooner if your blood sugar goes below 70, or if most of your readings are over 200.  Please come back for a follow-up appointment in 3 months.

## 2021-01-29 NOTE — Patient Instructions (Addendum)
Please continue the same Tresiba I have sent a prescription to your pharmacy, to add Victoza. check your blood sugar twice a day.  vary the time of day when you check, between before the 3 meals, and at bedtime.  also check if you have symptoms of your blood sugar being too high or too low.  please keep a record of the readings and bring it to your next appointment here (or you can bring the meter itself).  You can write it on any piece of paper.  please call us sooner if your blood sugar goes below 70, or if most of your readings are over 200.  Please come back for a follow-up appointment in 3 months.

## 2021-01-29 NOTE — Telephone Encounter (Signed)
Patient Advocate Encounter   Received notification from the patient's pharmacy that prior authorization for Evaristo Bury is required by his/her insurance [employer].   PA submitted on 01/28/21  Key#: [id number]  Status is pending    [facility] will continue to follow:  Patient Advocate Fax: [phone]

## 2021-02-02 ENCOUNTER — Telehealth: Payer: Self-pay

## 2021-02-02 ENCOUNTER — Other Ambulatory Visit (HOSPITAL_COMMUNITY): Payer: Self-pay

## 2021-02-02 NOTE — Telephone Encounter (Signed)
Patient Advocate Encounter  Received notification from [employer] that the request for prior authorization for Nathan [name] has been denied due to the patient not trying Lantus or Levemir.     This encounter will continue to be updated until final determination.    Specialty Pharmacy Patient Advocate Fax: [phone]

## 2021-02-02 NOTE — Telephone Encounter (Signed)
Patient Advocate Encounter   Received notification from [facility] that prior authorization for Victoza is required by his/her insurance [employer].   PA submitted on 02/02/21  Key#: [id number]  Status is pending    [facility] will continue to follow:  Patient Advocate Fax: [phone]

## 2021-02-03 ENCOUNTER — Other Ambulatory Visit (HOSPITAL_COMMUNITY): Payer: Self-pay

## 2021-02-03 NOTE — Telephone Encounter (Signed)
Patient Advocate Encounter  Prior Authorization for Victoza 18mg /89ml pen injectors has been approved.    PA# 1m  Effective dates: 02/02/21 through 02/02/22  Per Test Claim Patients co-pay is RTS  Spoke with Pharmacy to Process.  Patient Advocate Fax: [phone]

## 2021-02-17 ENCOUNTER — Other Ambulatory Visit: Payer: Self-pay | Admitting: Endocrinology

## 2021-02-17 DIAGNOSIS — E039 Hypothyroidism, unspecified: Secondary | ICD-10-CM

## 2021-02-18 ENCOUNTER — Other Ambulatory Visit: Payer: Self-pay | Admitting: Endocrinology

## 2021-03-14 IMAGING — DX DG CHEST 2V
2 series · 2 of 2 positions shown · non-contrast
Comparison: 10/11/2017

CLINICAL DATA: Chest pain

EXAM:
CHEST - 2 VIEW

[chest pa]
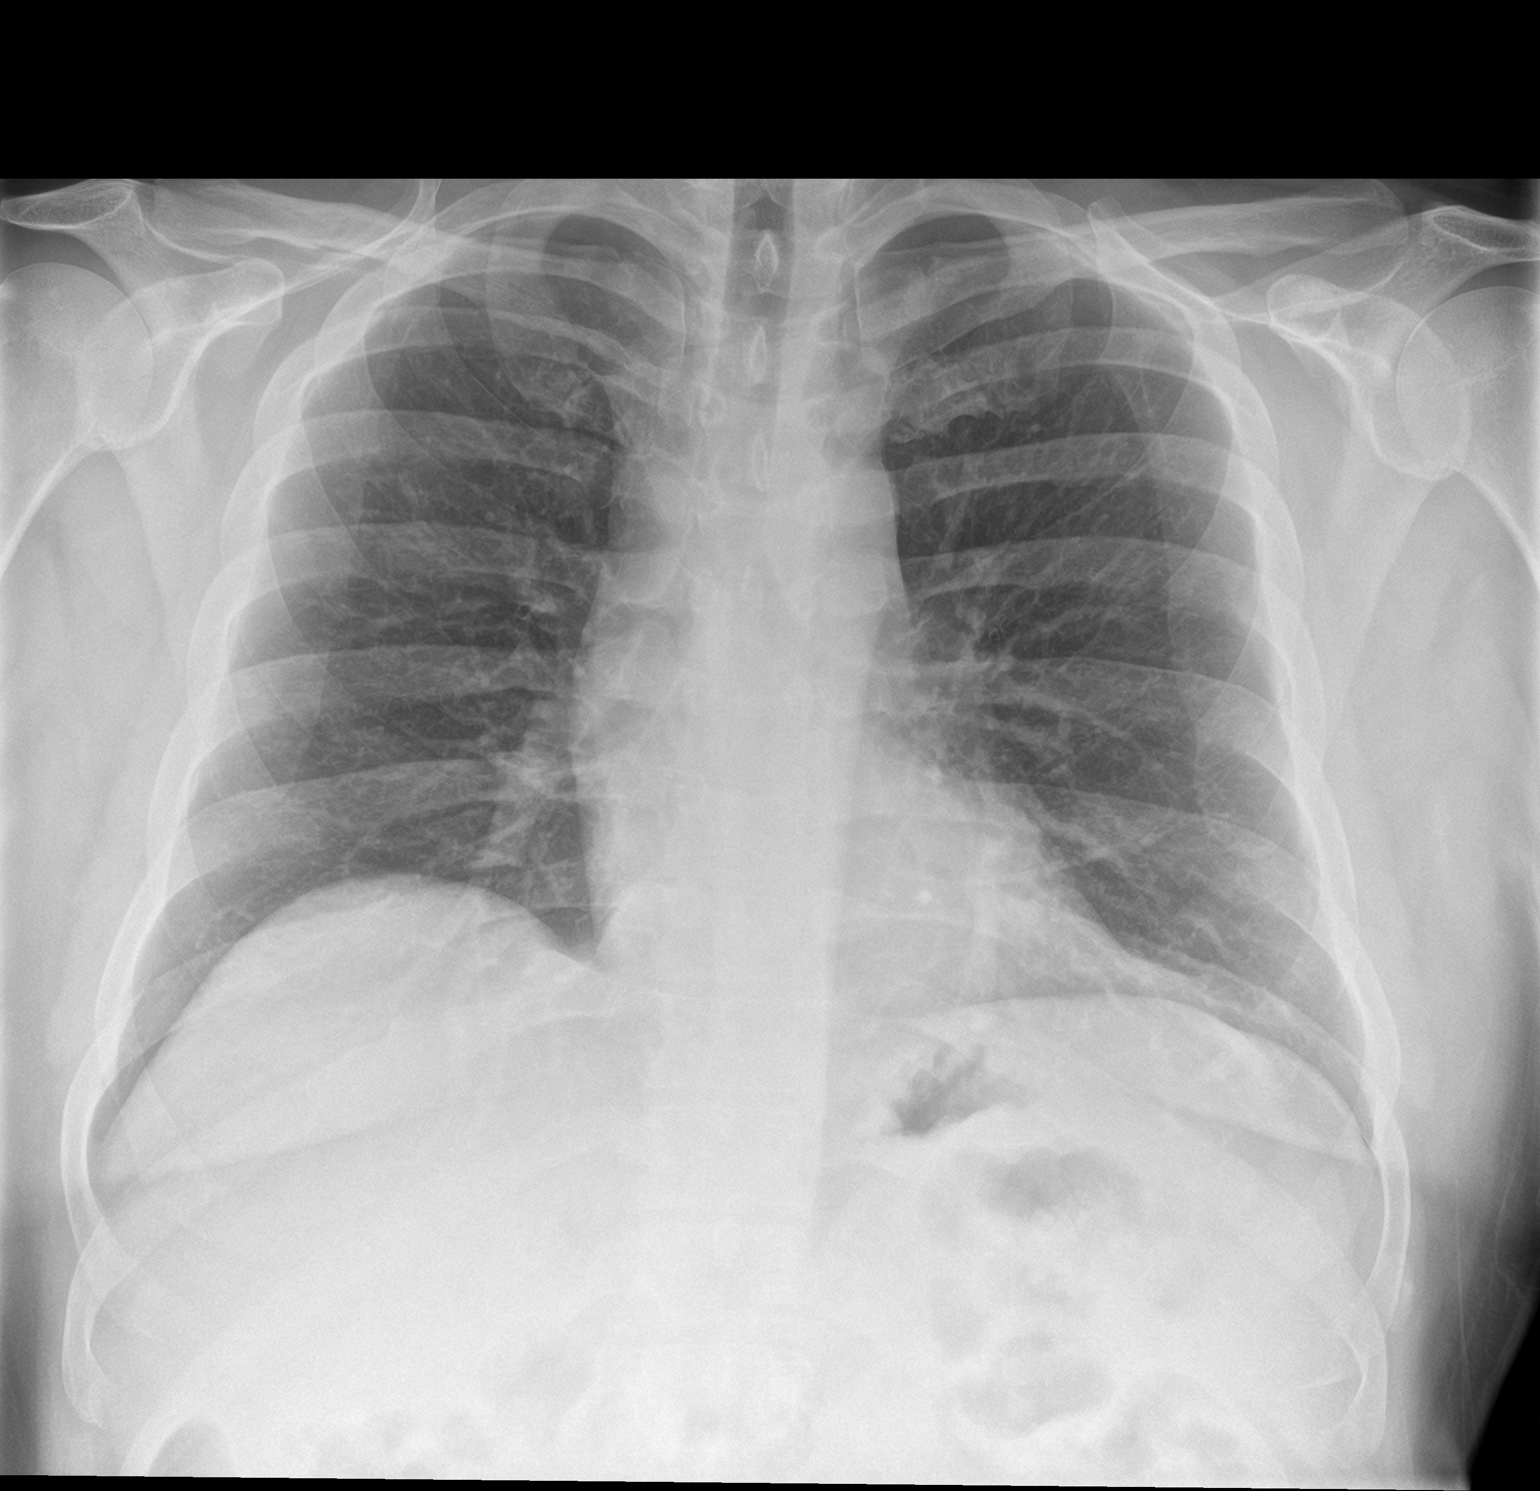

[chest lat]
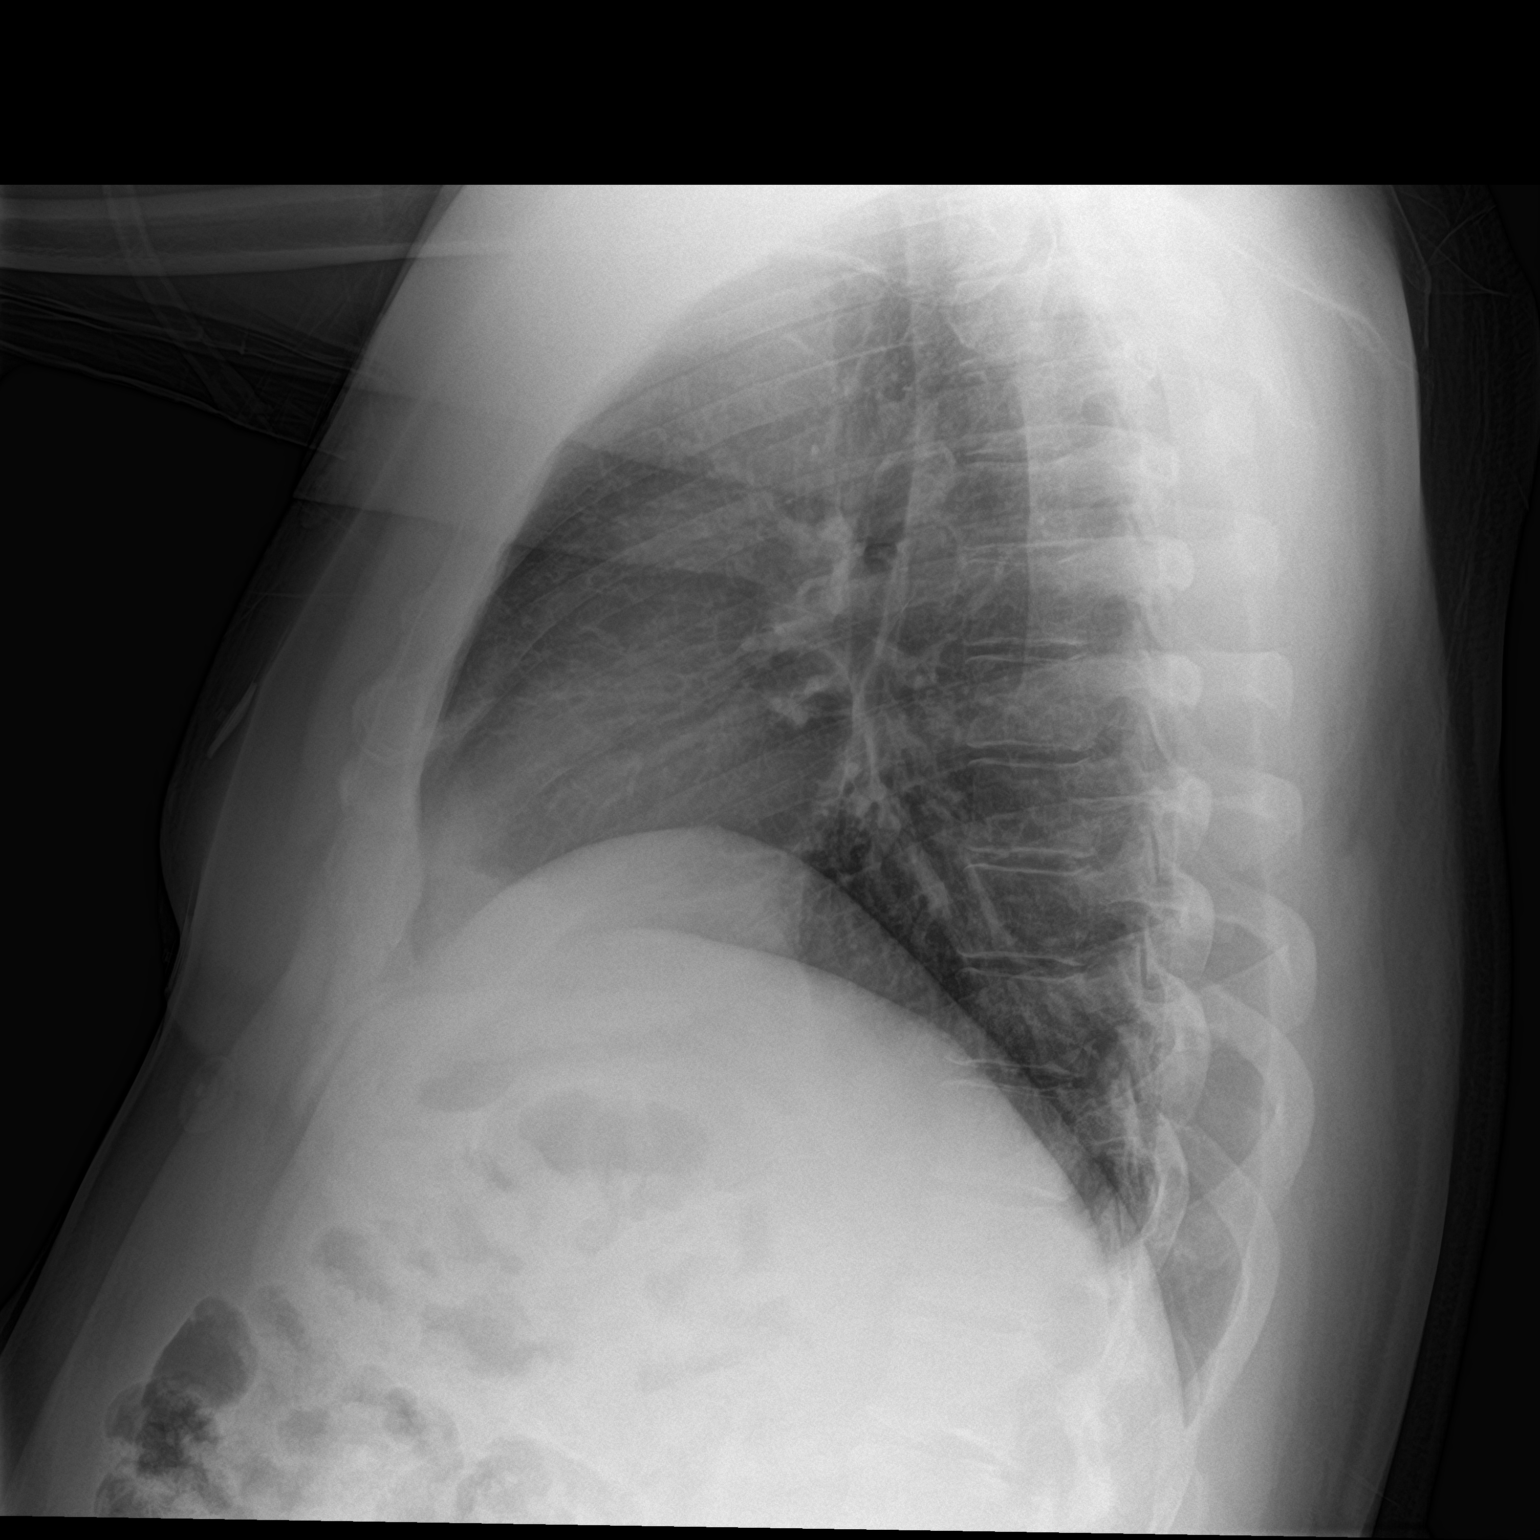

[2 of 2 positions shown; findings below may reference images not displayed]

FINDINGS: The heart size and mediastinal contours are within normal limits.
Both lungs are clear. No pleural effusions. No pneumothorax. The
visualized skeletal structures are unremarkable.
IMPRESSION: No acute cardiopulmonary disease.

## 2021-04-19 ENCOUNTER — Other Ambulatory Visit: Payer: Self-pay | Admitting: Endocrinology

## 2021-04-19 DIAGNOSIS — E039 Hypothyroidism, unspecified: Secondary | ICD-10-CM

## 2021-05-14 ENCOUNTER — Ambulatory Visit: Payer: Medicaid Other | Admitting: Endocrinology

## 2021-06-01 ENCOUNTER — Other Ambulatory Visit: Payer: Self-pay | Admitting: Endocrinology

## 2021-06-01 DIAGNOSIS — E039 Hypothyroidism, unspecified: Secondary | ICD-10-CM

## 2021-08-16 ENCOUNTER — Other Ambulatory Visit: Payer: Self-pay | Admitting: Internal Medicine

## 2021-08-16 DIAGNOSIS — E039 Hypothyroidism, unspecified: Secondary | ICD-10-CM

## 2021-10-05 ENCOUNTER — Other Ambulatory Visit: Payer: Self-pay | Admitting: Endocrinology

## 2021-10-05 DIAGNOSIS — E039 Hypothyroidism, unspecified: Secondary | ICD-10-CM

## 2021-10-06 ENCOUNTER — Other Ambulatory Visit: Payer: Self-pay | Admitting: Endocrinology

## 2021-10-06 DIAGNOSIS — E039 Hypothyroidism, unspecified: Secondary | ICD-10-CM

## 2022-09-30 ENCOUNTER — Other Ambulatory Visit (HOSPITAL_COMMUNITY): Payer: Self-pay | Admitting: Internal Medicine

## 2022-09-30 DIAGNOSIS — E1140 Type 2 diabetes mellitus with diabetic neuropathy, unspecified: Secondary | ICD-10-CM

## 2022-09-30 DIAGNOSIS — Z136 Encounter for screening for cardiovascular disorders: Secondary | ICD-10-CM

## 2022-10-06 ENCOUNTER — Ambulatory Visit (HOSPITAL_COMMUNITY)
Admission: RE | Admit: 2022-10-06 | Discharge: 2022-10-06 | Disposition: A | Discharge status: Home / Self Care | Payer: Medicaid Other | Source: Ambulatory Visit | Attending: Internal Medicine | Admitting: Internal Medicine

## 2022-10-06 DIAGNOSIS — Z136 Encounter for screening for cardiovascular disorders: Secondary | ICD-10-CM | POA: Diagnosis present

## 2022-10-06 DIAGNOSIS — E1140 Type 2 diabetes mellitus with diabetic neuropathy, unspecified: Secondary | ICD-10-CM | POA: Diagnosis present

## 2022-10-14 ENCOUNTER — Other Ambulatory Visit: Payer: Self-pay | Admitting: *Deleted

## 2022-10-14 DIAGNOSIS — K6289 Other specified diseases of anus and rectum: Secondary | ICD-10-CM

## 2022-10-18 ENCOUNTER — Ambulatory Visit: Payer: Medicaid Other | Admitting: General Surgery

## 2022-10-20 ENCOUNTER — Ambulatory Visit (INDEPENDENT_AMBULATORY_CARE_PROVIDER_SITE_OTHER): Payer: Medicaid Other | Admitting: General Surgery

## 2022-10-20 ENCOUNTER — Encounter: Payer: Self-pay | Admitting: General Surgery

## 2022-10-20 VITALS — BP 165/109 | HR 85 | Temp 97.6°F | Resp 16 | Ht 71.0 in | Wt 208.0 lb

## 2022-10-20 DIAGNOSIS — K603 Anal fistula, unspecified: Secondary | ICD-10-CM | POA: Insufficient documentation

## 2022-10-20 NOTE — Patient Instructions (Signed)
Anal Fistula  An anal fistula is a hole that forms between the bowel and the skin near the opening of the butt (anus).  The anus lets poop (stool) leave the body. It is made up of many small glands. In some cases, these glands can get plugged. This can cause a pocket filled with fluid (abscess) to form. An anal fistula may happen when an abscess gets infected. What are the causes? In most cases, an anal fistula is caused by an abscess. Other causes include: A problem from surgery. An injury to the rectum or the area around it. The use of high-energy beams (radiation) to treat the area around the rectum. What increases the risk? You are more likely to get an anal fistula if you have certain conditions. These include: Chronic inflammatory bowel disease, such as Crohn's disease or ulcerative colitis. Colon cancer or rectal cancer. Diverticular disease, such as diverticulitis. A sexually transmitted infection (STI), such as gonorrhea, chlamydia, or syphilis. An infection caused by human immunodeficiency virus (HIV). What are the signs or symptoms? Symptoms of an anal fistula include: Throbbing or constant pain that may get worse when you sit. Swelling or irritation around the anus. Pus or blood coming from a hole near the anus. Pain when you poop. Fever or chills. How is this diagnosed? This condition is diagnosed based on a physical exam. Your health care provider may: Look for the opening of the fistula on the outside of your body. Look for the opening of the fistula on the inside of your body. This may be done with a probe or scope. Do an exam of the rectum with a gloved hand. This is called a digital rectal exam. You may also have tests done. These may include: Imaging tests that use dye to find the fistula, such as: X-rays. Ultrasound. CT scan. MRI. Other tests to find the cause of the fistula. This may include blood tests. How is this treated? An anal fistula is often treated  with surgery. This may include: A fistulotomy. This is when the fistula is opened and drained. It helps with healing. Seton placement. This is when a silk string (seton) is put in during a fistulotomy. It helps to drain any infection. Advancement flap procedure. This is when tissue is taken from your rectum or the skin around your anus. The tissue is attached to the fistula. Bioprosthetic plug. This is when a plug shaped like a cone is made from your tissue. It is used to block the fistula. Some anal fistulas do not require surgery. In some cases, they can be sealed with a fibrin glue. You may also need to take antibiotics. Follow these instructions at home: Medicines Take over-the-counter and prescription medicines only as told by your provider. If you were prescribed antibiotics, take them as told by your provider. Do not stop using the antibiotic even if you start to feel better. Managing constipation You may need to take these actions to prevent or treat constipation: Drink enough fluid to keep your pee (urine) pale yellow. Take over-the-counter or prescription medicines. Use a stool softener or a medicine that helps you poop (laxative) as told by your provider. Eat foods that are high in fiber, such as beans, whole grains, and fresh fruits and vegetables. Limit foods that are high in fat and processed sugars, such as fried or sweet foods. General instructions  Take a warm sitz bath for 15-20 minutes, 3-4 times a day, or as told by your provider. Sitz baths can  ease your pain and discomfort. They can also help with healing. Keep the area around the anus as clean and dry as you can. Use wet toilet paper or a moist towelette after you poop. Contact a health care provider if: You have pain that does not get better with medicine. You have new redness or swelling near the anus. You have new fluid, blood, or pus coming from the anus. The area around the anus feels tender or warm. You have a  fever or chills. You have diarrhea. Get help right away if: You are in severe pain. You have severe problems peeing (urinating) or pooping. This information is not intended to replace advice given to you by your health care provider. Make sure you discuss any questions you have with your health care provider. Document Revised: 10/25/2021 Document Reviewed: 10/25/2021 Elsevier Patient Education  2024 [employer].

## 2022-10-20 NOTE — Progress Notes (Signed)
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 Sherwood Gambler  Chief Complaint   New Patient (Initial Visit)     Nathan [name] is a 47 y.o. male.  HPI: Nathan [name] is a 47 yo who has been having perianal irritation and drainage for about 6 months now. 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 He says since that time he has had some bleeding and drainage that is daily. He was seen by Dr. 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   The patient has this vague history of a possible cardiac arrest in 2012 when he was first diagnosed with diabetes and came in to the hospital in what sounds like almost DKA. He had a cardiac catheterization then and everything was normal. He had seen cardiology since that time, last note from 2017 and his work up has always been negative. In that note it is felt like he had more of a vagal response and bradycardia than a true cardiac arrest. He denies any new symptoms and since 2017 has had an odd exertional type of chest/sternal pain that the cardiologist felt was more musculoskeletal.   He has never had a colonoscopy and just describes some minor blood. He does have a maternal uncle with Crohn's but he has never had significant blood or mucus or abdominal pain.    Past Medical History:  Diagnosis Date   Cardiac arrest (HCC)    Chest pain    Diabetes mellitus    HLD (hyperlipidemia)    Hypertension    Obesity    Pneumonia    Syncope    Thyroid disease    TMJ (dislocation of temporomandibular joint)     Past Surgical History:  Procedure Laterality Date   CARDIAC CATHETERIZATION     no blockage   HAND SURGERY      Family History  Problem Relation Age of Onset   Heart failure Mother    Diabetes Mother    Heart attack Father        x5    Diabetes Father     Social History    Tobacco Use   Smoking status: Former    Current packs/day: 0.00    Types: Cigarettes    Start date: 01/24/2002    Quit date: 01/25/2008    Years since quitting: 14.7   Smokeless tobacco: Never  Substance Use Topics   Alcohol use: Not Currently    Alcohol/week: 4.0 standard drinks of alcohol    Types: 4 Cans of beer per week    Comment: 3-4 beers weekly   Drug use: No    Medications: I have reviewed the patient's current medications. Allergies as of 10/20/2022       Reactions   Benzocaine Anaphylaxis, Other (See Comments)   REACTION: Blistering all over and throat swells shut   Orange Juice [orange Oil] Anaphylaxis   Flavoring Agent    Grapefruit Concentrate         Medication List        Accurate as of October 20, 2022  9:34 AM. If you have any questions, ask your nurse or doctor.          STOP taking these medications    cyclobenzaprine 10 MG tablet Commonly known as: FLEXERIL   dicyclomine 20 MG tablet Commonly known as: BENTYL   naproxen  500 MG tablet Commonly known as: NAPROSYN   ReliOn Pen Needles 31G X 6 MM Misc Generic drug: Insulin Pen Needle   tiZANidine 4 MG tablet Commonly known as: ZANAFLEX   traMADol 50 MG tablet Commonly known as: Vedia Pereyra FlexTouch 200 UNIT/ML FlexTouch Pen Generic drug: insulin degludec   Victoza 18 MG/3ML Sopn Generic drug: liraglutide       TAKE these medications    amLODipine 10 MG tablet Commonly known as: NORVASC Take 1 tablet (10 mg total) by mouth daily.   CoQ10 100 MG Caps Take by mouth.   empagliflozin 25 MG Tabs tablet Commonly known as: Jardiance Take 1 tablet (25 mg total) by mouth daily before breakfast.   glimepiride 4 MG tablet Commonly known as: AMARYL Take 8 mg by mouth daily.   Lantus SoloStar 100 UNIT/ML Solostar Pen Generic drug: insulin glargine SMARTSIG:20-80 Unit(s) SUB-Q Every Night   levothyroxine 88 MCG tablet Commonly known as: SYNTHROID TAKE 1 TABLET BY  MOUTH ONCE DAILY BEFORE BREAKFAST   rosuvastatin 10 MG tablet Commonly known as: CRESTOR Take 1 tablet by mouth daily.   testosterone cypionate 200 MG/ML injection Commonly known as: DEPOTESTOSTERONE CYPIONATE Inject 200 mg into the muscle every 14 (fourteen) days.         ROS:  A comprehensive review of systems was negative except for: Gastrointestinal: positive for perianal pain and drainage Musculoskeletal: positive for back pain, neck pain, and joint pain Endocrine: positive for diabetes  Blood pressure (!) 165/109, pulse 85, temperature 97.6 F (36.4 C), temperature source Oral, resp. rate 16, height 5\' 11"  (1.803 m), weight 208 lb (94.3 kg), SpO2 99%. Physical Exam Vitals reviewed.  HENT:     Head: Normocephalic.     Nose: Nose normal.  Eyes:     Extraocular Movements: Extraocular movements intact.  Cardiovascular:     Rate and Rhythm: Normal rate and regular rhythm.  Pulmonary:     Effort: Pulmonary effort is normal.     Breath sounds: Normal breath sounds.  Abdominal:     General: There is no distension.     Palpations: Abdomen is soft.     Tenderness: There is no abdominal tenderness.  Genitourinary:    Rectum: No mass, anal fissure or external hemorrhoid. Normal anal tone.       Comments: Just right of midline posterior opening with drainage  Musculoskeletal:        General: No swelling.     Cervical back: Normal range of motion.  Skin:    General: Skin is warm.  Neurological:     Mental Status: He is alert.     Results: None    Assessment & Plan:  Nathan [name] is a 47 y.o. male with what looks like a perianal fistula and based on the description of the abscess is likely related to that event. Discussed abscesses and fistulas. Discussed exam under anesthesia, risk of bleeding, infection, injury to sphincter muscle, fistulotomy if the fistula goes through skin or minimal muscle versus seton placement and need for further procedures and colorectal  referral.    All questions were answered to the satisfaction of the patient and family. He knows to hold his Jardiance.   Nathan [name] 10/20/2022, 9:34 AM

## 2022-10-21 NOTE — H&P (Signed)
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 Sherwood [name]  Chief Complaint   New Patient (Initial Visit)     Nathan [name] is a 47 y.o. male.  HPI: Nathan [name] is a 47 yo who has been having perianal irritation and drainage for about 6 months now. 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 He says since that time he has had some bleeding and drainage that is daily. He was seen by Dr. 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   The patient has this vague history of a possible cardiac arrest in 2012 when he was first diagnosed with diabetes and came in to the hospital in what sounds like almost DKA. He had a cardiac catheterization then and everything was normal. He had seen cardiology since that time, last note from 2017 and his work up has always been negative. In that note it is felt like he had more of a vagal response and bradycardia than a true cardiac arrest. He denies any new symptoms and since 2017 has had an odd exertional type of chest/sternal pain that the cardiologist felt was more musculoskeletal.   He has never had a colonoscopy and just describes some minor blood. He does have a maternal uncle with Crohn's but he has never had significant blood or mucus or abdominal pain.    Past Medical History:  Diagnosis Date   Cardiac arrest (HCC)    Chest pain    Diabetes mellitus    HLD (hyperlipidemia)    Hypertension    Obesity    Pneumonia    Syncope    Thyroid disease    TMJ (dislocation of temporomandibular joint)     Past Surgical History:  Procedure Laterality Date   CARDIAC CATHETERIZATION     no blockage   HAND SURGERY      Family History  Problem Relation Age of Onset   Heart failure Mother    Diabetes Mother    Heart attack Father        x5    Diabetes Father     Social History    Tobacco Use   Smoking status: Former    Current packs/day: 0.00    Types: Cigarettes    Start date: 01/24/2002    Quit date: 01/25/2008    Years since quitting: 14.7   Smokeless tobacco: Never  Substance Use Topics   Alcohol use: Not Currently    Alcohol/week: 4.0 standard drinks of alcohol    Types: 4 Cans of beer per week    Comment: 3-4 beers weekly   Drug use: No    Medications: I have reviewed the patient's current medications. Allergies as of 10/20/2022       Reactions   Benzocaine Anaphylaxis, Other (See Comments)   REACTION: Blistering all over and throat swells shut   Orange Juice [orange Oil] Anaphylaxis   Flavoring Agent    Grapefruit Concentrate         Medication List        Accurate as of October 20, 2022  9:34 AM. If you have any questions, ask your nurse or doctor.          STOP taking these medications    cyclobenzaprine 10 MG tablet Commonly known as: FLEXERIL   dicyclomine 20 MG tablet Commonly known as: BENTYL   naproxen  500 MG tablet Commonly known as: NAPROSYN   ReliOn Pen Needles 31G X 6 MM Misc Generic drug: Insulin Pen Needle   tiZANidine 4 MG tablet Commonly known as: ZANAFLEX   traMADol 50 MG tablet Commonly known as: Vedia Pereyra FlexTouch 200 UNIT/ML FlexTouch Pen Generic drug: insulin degludec   Victoza 18 MG/3ML Sopn Generic drug: liraglutide       TAKE these medications    amLODipine 10 MG tablet Commonly known as: NORVASC Take 1 tablet (10 mg total) by mouth daily.   CoQ10 100 MG Caps Take by mouth.   empagliflozin 25 MG Tabs tablet Commonly known as: Jardiance Take 1 tablet (25 mg total) by mouth daily before breakfast.   glimepiride 4 MG tablet Commonly known as: AMARYL Take 8 mg by mouth daily.   Lantus SoloStar 100 UNIT/ML Solostar Pen Generic drug: insulin glargine SMARTSIG:20-80 Unit(s) SUB-Q Every Night   levothyroxine 88 MCG tablet Commonly known as: SYNTHROID TAKE 1 TABLET BY  MOUTH ONCE DAILY BEFORE BREAKFAST   rosuvastatin 10 MG tablet Commonly known as: CRESTOR Take 1 tablet by mouth daily.   testosterone cypionate 200 MG/ML injection Commonly known as: DEPOTESTOSTERONE CYPIONATE Inject 200 mg into the muscle every 14 (fourteen) days.         ROS:  A comprehensive review of systems was negative except for: Gastrointestinal: positive for perianal pain and drainage Musculoskeletal: positive for back pain, neck pain, and joint pain Endocrine: positive for diabetes  Blood pressure (!) 165/109, pulse 85, temperature 97.6 F (36.4 C), temperature source Oral, resp. rate 16, height 5\' 11"  (1.803 m), weight 208 lb (94.3 kg), SpO2 99%. Physical Exam Vitals reviewed.  HENT:     Head: Normocephalic.     Nose: Nose normal.  Eyes:     Extraocular Movements: Extraocular movements intact.  Cardiovascular:     Rate and Rhythm: Normal rate and regular rhythm.  Pulmonary:     Effort: Pulmonary effort is normal.     Breath sounds: Normal breath sounds.  Abdominal:     General: There is no distension.     Palpations: Abdomen is soft.     Tenderness: There is no abdominal tenderness.  Genitourinary:    Rectum: No mass, anal fissure or external hemorrhoid. Normal anal tone.       Comments: Just right of midline posterior opening with drainage  Musculoskeletal:        General: No swelling.     Cervical back: Normal range of motion.  Skin:    General: Skin is warm.  Neurological:     Mental Status: He is alert.     Results: None    Assessment & Plan:  Nathan [name] is a 47 y.o. male with what looks like a perianal fistula and based on the description of the abscess is likely related to that event. Discussed abscesses and fistulas. Discussed exam under anesthesia, risk of bleeding, infection, injury to sphincter muscle, fistulotomy if the fistula goes through skin or minimal muscle versus seton placement and need for further procedures and colorectal  referral.    All questions were answered to the satisfaction of the patient and family. He knows to hold his Jardiance.   Nathan [name] 10/20/2022, 9:34 AM

## 2022-10-24 NOTE — Patient Instructions (Signed)
Nathan [name]  10/24/2022     @PREFPERIOPPHARMACY @   Your procedure is scheduled on 10/27/22.  Report to [facility] at 0600 A.M.  Call this number if you have problems the morning of surgery:  [phone]  If you experience any cold or flu symptoms such as cough, fever, chills, shortness of breath, etc. between now and your scheduled surgery, please notify us at the above number.   Remember:  Do not eat or drink after midnight.   Take these medicines the morning of surgery with A SIP OF WATER norvasc & synthroid    Do not wear jewelry, make-up or nail polish, including gel polish,  artificial nails, or any other type of covering on natural nails (fingers and  toes).  Do not wear lotions, powders, or perfumes, or deodorant.  Do not shave 48 hours prior to surgery.  Men may shave face and neck.  Do not bring valuables to the hospital.  [facility] is not responsible for any belongings or valuables.  Contacts, dentures or bridgework may not be worn into surgery.  Leave your suitcase in the car.  After surgery it may be brought to your room.  For patients admitted to the hospital, discharge time will be determined by your treatment team.  Patients discharged the day of surgery will not be allowed to drive home.   Name and phone number of your driver:   family Special instructions:  n/a  Please read over the following fact sheets that you were given. Coughing and Deep Breathing, Surgical Site Infection Prevention, Anesthesia Post-op Instructions, and Care and Recovery After Surgery      PATIENT INSTRUCTIONS POST-ANESTHESIA  IMMEDIATELY FOLLOWING SURGERY:  Do not drive or operate machinery for the first twenty four hours after surgery.  Do not make any important decisions for twenty four hours after surgery or while taking narcotic pain medications or sedatives.  If you develop intractable nausea and vomiting or a severe headache please notify your doctor  immediately.  FOLLOW-UP:  Please make an appointment with your surgeon as instructed. You do not need to follow up with anesthesia unless specifically instructed to do so.  WOUND CARE INSTRUCTIONS (if applicable):  Keep a dry clean dressing on the anesthesia/puncture wound site if there is drainage.  Once the wound has quit draining you may leave it open to air.  Generally you should leave the bandage intact for twenty four hours unless there is drainage.  If the epidural site drains for more than 36-48 hours please call the anesthesia department.  QUESTIONS?:  Please feel free to call your physician or the hospital operator if you have any questions, and they will be happy to assist you.      Chlorhexidine Bathing to Help Prevent Infection Chlorhexidine gluconate (CHG) is a germ-killing (antiseptic) wash used to clean the skin. CHG works better than regular soap and water to kill germs that live on the skin. It can keep them away for about 24 hours. CHG comes as: 4% body wash that is rinsed off in the shower. 2% prepackaged cloths that are not rinsed off. Body wash that is not rinsed off during a sponge bath. Cleaning your skin with CHG helps lower your risk for infection. It may be used if: You are staying in an intensive care unit (ICU). You have a vascular access, like a central line or midline catheter. You have a catheter to drain urine from your bladder. You are on a ventilator. This is  a machine that helps you breathe. You need to keep an area of your body clean before or after surgery. Where is CHG used? CHG liquid or cloths are often used in the hospital. Your health care team may give you a daily sponge bath with CHG if you are in the hospital. You may even use these CHG products at home before surgery. Why is CHG used? CHG is used to help prevent infections by removing the germs on your skin. It is often needed when your body's disease-fighting system (immune system) is not working  as well as it normally does. This could happen when: You are sick. You need surgery. You have lines, tubes, or other devices going into your body. You need to stay in the hospital. Washing your skin with CHG also helps to kill germs that are very hard to kill with antibiotics. These germs can be found in hospitals and are called multidrug-resistant organisms (MDROs). CHG can help lessen your risk of getting really sick if you are exposed to MDROs while in the hospital. Can I use CHG if I have a line, tube, or device? Yes, you can use CHG if you have a line, tube, or device. Your health care team will help you wash the line, tube, or device as part of your daily bath. Or, you may be taught how to use CHG at home. Can I take a shower instead of a sponge bath? Your health care team will tell you if you can take a shower. If you are allowed, you will be given instructions on how to do this. Can I use my own skin products with CHG? No, use only CHG. Other soaps, creams, or oils may keep the CHG from working. Ask your health care provider if you can use lotion. Some lotions are compatible with CHG. Is CHG safe? CHG is safe for everyday use. The most common problems are dry skin and mild rashes. Make sure you do not get straight concentrate CHG in your eyes or ears. There is a risk of vision problems or hearing problems if this happens. Suds getting in your eyes is okay. Tell your provider about any changes you notice after washing with CHG. This information is not intended to replace advice given to you by your health care provider. Make sure you discuss any questions you have with your health care provider. Document Revised: 08/26/2021 Document Reviewed: 07/22/2021 Elsevier Patient Education  2024 [employer] Inc[employer] Anal Fistulotomy  Anal fistulotomy is a surgery to open and drain an anal fistula. An anal fistula is a tunnel that forms between the bowel and the skin near the opening of the butt (anus).   Opening and draining the fistula can help the area heal. Tell a health care provider about: Any allergies you have. All medicines you are taking, including vitamins, herbs, eye drops, creams, and over-the-counter medicines. Any problems you or family members have had with anesthesia. Any bleeding problems you have. Any surgeries you have had. Any medical conditions you have. Whether you are pregnant or may be pregnant. Any problems you have controlling when you pee (urinate) or poop (incontinence). What are the risks? Your health care provider will talk with you about risks. These may include: Infection. Bleeding. Allergic reactions to medicines or dyes. Damage to nearby structures or organs. Incontinence or leaking of poop (stool). Being unable to empty your bladder (urinary retention). Needing more surgery. You may need this if the fistula comes back. What happens before the surgery?  When to stop eating and drinking Follow instructions from your provider about what you may eat and drink. These may include: 8 hours before your surgery Stop eating most foods. Do not eat meat, fried foods, or fatty foods. Eat only light foods, such as toast or crackers. All liquids are okay except energy drinks and alcohol. 6 hours before your surgery Stop eating. Drink only clear liquids, such as water, clear fruit juice, black coffee, plain tea, and sports drinks. Do not drink energy drinks or alcohol. 2 hours before your surgery Stop drinking all liquids. You may be allowed to take medicines with small sips of water. If you do not follow your provider's instructions, your surgery may be delayed or canceled. Medicines Ask your provider about: Changing or stopping your regular medicines. These include any diabetes medicines or blood thinners you take. Taking medicines such as aspirin and ibuprofen. These medicines can thin your blood. Do not take them unless your provider tells you to. Taking  over-the-counter medicines, vitamins, herbs, and supplements. You may be told to take a medicine that helps you poop (laxative). You may also be told to use an enema to clean your bowels. Tests You may have an exam or testing. You may have a blood or pee (urine) sample taken. Surgery safety Ask your provider what steps will be taken to help prevent infection. These may include: Removing hair at the surgery site. Washing skin with a soap that kills germs. Taking antibiotics. General instructions Do not use any products that contain nicotine or tobacco for at least 4 weeks before the surgery. These products include cigarettes, chewing tobacco, and vaping devices, such as e-cigarettes. If you need help quitting, ask your provider. If you will be going home right after the surgery, plan to have a responsible adult: Take you home from the hospital or clinic. You will not be allowed to drive. Care for you for the time you are told. What happens during the surgery? An IV will be inserted into one of your veins. You will be given: A sedative. This helps you relax. Anesthesia. This keeps you from feeling pain. It will make you fall asleep for surgery. The opening of the fistula will be found inside your body. An incision will be made. It may extend into the muscles around the anus (sphincter muscles). The fistula will be cut open and drained. Gauze bandages (dressings) may be put inside the fistula. The fistula will be left open. This helps it heal as flat scar tissue. The surgery may vary among providers and hospitals. What happens after the surgery? Your blood pressure, heart rate, breathing rate, and blood oxygen level will be monitored until you leave the hospital or clinic. You may have some bleeding. You may have to wear a pad to absorb blood. This information is not intended to replace advice given to you by your health care provider. Make sure you discuss any questions you have with your  health care provider. Document Revised: 11/16/2021 Document Reviewed: 11/16/2021 Elsevier Patient Education  2024 Elsevier Inc. Anal Fistulotomy, Care After After an anal fistulotomy, it is common to have: Some pain, discomfort, and swelling. More pain when you poop. Some bleeding from the incision. Some leakage of poop (stool). Follow these instructions at home: Medicines Take over-the-counter and prescription medicines only as told by your health care provider. If you were prescribed antibiotics, take them as told by your provider. Do not stop using the antibiotic even if you start to feel better.  Ask your provider if the medicine prescribed to you requires you to avoid driving or using machinery. Incision care  Follow instructions from your provider about how to take care of your incision. Make sure you: Wash your hands with soap and water for at least 20 seconds before and after you remove your bandage (dressing). If soap and water are not available, use hand sanitizer. Remove your dressing as told by your provider. In some cases, you may be told not to remove your dressing. You may need to let it come out with your first poop after surgery. Keep the incision area clean and dry. Check your incision area every day for signs of infection. Check for: More redness, swelling, or pain. More fluid or blood. Warmth. Pus or a bad smell. Self-care  After you poop, clean the incision area. Use one of these methods: Gently wipe with a moist towelette. Gently wipe with mild soap and water. Take a shower. Take a sitz bath. This is a shallow, warm-water bath that attaches to the toilet bowl. You can also sit in a bathtub filled with warm water. If told, put ice on the incision area. Put ice in a plastic bag. Place a towel between your skin and the bag. Leave the ice on for 20 minutes, 2-3 times a day. If your skin turns bright red, remove the ice right away to prevent skin damage. The risk  of damage is higher if you cannot feel pain, heat, or cold. Managing constipation To prevent or treat constipation, you may need to: Drink enough fluid to keep your pee (urine) pale yellow. Take over-the-counter or prescription medicines. Eat foods that are high in fiber, such as beans, whole grains, and fresh fruits and vegetables. Limit foods that are high in fat and processed sugars, such as fried or sweet foods. Activity If you were given a sedative during the procedure, it can affect you for several hours. Do not drive or operate machinery until your provider says that it is safe. Rest as told by your provider. Do not sit for a long time without moving. Get up to take short walks every 1-2 hours. This will improve blood flow and breathing. Ask for help if you feel weak or unsteady. You may have to avoid lifting. Ask your provider how much you can safely lift. Return to your normal activities as told by your provider. Ask your provider what activities are safe for you. General instructions Follow instructions from your provider about what you may eat and drink. Do not use any products that contain nicotine or tobacco. These products include cigarettes, chewing tobacco, and vaping devices, such as e-cigarettes. If you need help quitting, ask your provider. If you have bleeding from the incision, wear a pad to absorb blood. Change it often. Do not swim or use a hot tub until your provider approves. Your provider may give you more instructions. Make sure you know what you can and cannot do. Contact a health care provider if: You have any signs of infection. You have a fever or chills. You have swelling or tenderness near your groin. You cannot control when you pee (urinate) or poop (incontinence). You are leaking poop. You have trouble peeing. Your pain does not get better with medicine. Get help right away if: You have severe pain in your abdomen or near your incision. You have sudden  chest pain. You become weak or you faint. You have bleeding from your incision that soaks 2 or more pads  in 24 hours. These symptoms may be an emergency. Get help right away. Call 911. Do not wait to see if the symptoms will go away. Do not drive yourself to the hospital. This information is not intended to replace advice given to you by your health care provider. Make sure you discuss any questions you have with your health care provider. Document Revised: 11/16/2021 Document Reviewed: 11/16/2021 Elsevier Patient Education  2024 [employer].

## 2022-10-25 ENCOUNTER — Encounter (HOSPITAL_COMMUNITY): Payer: Self-pay

## 2022-10-25 ENCOUNTER — Encounter (HOSPITAL_COMMUNITY)
Admission: RE | Admit: 2022-10-25 | Discharge: 2022-10-25 | Disposition: A | Discharge status: Home / Self Care | Payer: Medicaid Other | Source: Ambulatory Visit | Attending: General Surgery | Admitting: General Surgery

## 2022-10-25 DIAGNOSIS — K603 Anal fistula, unspecified: Secondary | ICD-10-CM

## 2022-10-26 ENCOUNTER — Encounter (HOSPITAL_COMMUNITY)
Admission: RE | Admit: 2022-10-26 | Discharge: 2022-10-26 | Disposition: A | Discharge status: Home / Self Care | Payer: Medicaid Other | Source: Ambulatory Visit | Attending: General Surgery | Admitting: General Surgery

## 2022-10-26 ENCOUNTER — Encounter (HOSPITAL_COMMUNITY): Payer: Self-pay

## 2022-10-26 VITALS — BP 143/94 | HR 85 | Temp 97.6°F | Resp 18 | Ht 71.0 in | Wt 208.0 lb

## 2022-10-26 DIAGNOSIS — Z01818 Encounter for other preprocedural examination: Secondary | ICD-10-CM | POA: Insufficient documentation

## 2022-10-26 DIAGNOSIS — R9431 Abnormal electrocardiogram [ECG] [EKG]: Secondary | ICD-10-CM | POA: Diagnosis not present

## 2022-10-26 DIAGNOSIS — Z8674 Personal history of sudden cardiac arrest: Secondary | ICD-10-CM | POA: Insufficient documentation

## 2022-10-26 DIAGNOSIS — E1159 Type 2 diabetes mellitus with other circulatory complications: Secondary | ICD-10-CM | POA: Diagnosis not present

## 2022-10-26 DIAGNOSIS — I469 Cardiac arrest, cause unspecified: Secondary | ICD-10-CM

## 2022-10-26 HISTORY — DX: Cardiac arrhythmia, unspecified: I49.9

## 2022-10-26 HISTORY — DX: Sleep apnea, unspecified: G47.30

## 2022-10-26 LAB — BASIC METABOLIC PANEL
Anion gap: 8 (ref 5–15)
BUN: 10 mg/dL (ref 6–20)
CO2: 25 mmol/L (ref 22–32)
Calcium: 9 mg/dL (ref 8.9–10.3)
Chloride: 100 mmol/L (ref 98–111)
Creatinine, Ser: 0.79 mg/dL (ref 0.61–1.24)
GFR, Estimated: >60 mL/min (ref >60)
Glucose, Bld: 392 mg/dL — ABNORMAL HIGH (ref 70–99)
Potassium: 3.9 mmol/L (ref 3.5–5.1)
Sodium: 133 mmol/L — ABNORMAL LOW (ref 135–145)

## 2022-10-26 NOTE — Anesthesia Preprocedure Evaluation (Addendum)
Anesthesia Evaluation  Patient identified by MRN, date of birth, ID band Patient awake    Reviewed: Allergy & Precautions, H&P , NPO status , Patient's Chart, lab work & pertinent test results, reviewed documented beta blocker date and time   Airway Mallampati: II  TM Distance: >3 FB Neck ROM: full    Dental  (+) Missing, Dental Advisory Given,    Pulmonary Patient abstained from smoking., former smoker   Pulmonary exam normal breath sounds clear to auscultation       Cardiovascular Exercise Tolerance: Good hypertension, Normal cardiovascular exam Rhythm:regular Rate:Normal  Cardiac arrest?  Atypical Chest pain with normal cardiac cath several years ago.   Neuro/Psych negative neurological ROS  negative psych ROS   GI/Hepatic negative GI ROS, Neg liver ROS,,,  Endo/Other  diabetes, Poorly Controlled, Type 2, Insulin Dependent, Oral Hypoglycemic AgentsHypothyroidism  Very poor compliance with insulin and control of his diabetes.  CBG on day prior to surgery is 398.  Renal/GU negative Renal ROS  negative genitourinary   Musculoskeletal   Abdominal   Peds  Hematology negative hematology ROS (+)   Anesthesia Other Findings   Reproductive/Obstetrics negative OB ROS                              Anesthesia Physical Anesthesia Plan  ASA: 3  Anesthesia Plan: General   Post-op Pain Management:    Induction: Intravenous  PONV Risk Score and Plan: Ondansetron and Dexamethasone  Airway Management Planned: LMA  Additional Equipment: None  Intra-op Plan:   Post-operative Plan:   Informed Consent: I have reviewed the patients History and Physical, chart, labs and discussed the procedure including the risks, benefits and alternatives for the proposed anesthesia with the patient or authorized representative who has indicated his/her understanding and acceptance.     Dental Advisory  Given  Plan Discussed with: CRNA  Anesthesia Plan Comments:         Anesthesia Quick Evaluation

## 2022-10-27 ENCOUNTER — Ambulatory Visit (HOSPITAL_COMMUNITY): Payer: Medicaid Other | Admitting: Anesthesiology

## 2022-10-27 ENCOUNTER — Encounter (HOSPITAL_COMMUNITY): Payer: Self-pay | Admitting: General Surgery

## 2022-10-27 ENCOUNTER — Ambulatory Visit (HOSPITAL_BASED_OUTPATIENT_CLINIC_OR_DEPARTMENT_OTHER): Payer: Self-pay | Admitting: Anesthesiology

## 2022-10-27 ENCOUNTER — Ambulatory Visit (HOSPITAL_COMMUNITY)
Admission: RE | Admit: 2022-10-27 | Discharge: 2022-10-27 | Disposition: A | Discharge status: Home / Self Care | Payer: Medicaid Other | Attending: General Surgery | Admitting: General Surgery

## 2022-10-27 ENCOUNTER — Encounter (HOSPITAL_COMMUNITY)
Admission: RE | Disposition: A | Discharge status: Home / Self Care | Payer: Self-pay | Source: Home / Self Care | Attending: General Surgery

## 2022-10-27 DIAGNOSIS — K603 Anal fistula, unspecified: Secondary | ICD-10-CM

## 2022-10-27 DIAGNOSIS — Z794 Long term (current) use of insulin: Secondary | ICD-10-CM | POA: Insufficient documentation

## 2022-10-27 DIAGNOSIS — I10 Essential (primary) hypertension: Secondary | ICD-10-CM

## 2022-10-27 DIAGNOSIS — E1165 Type 2 diabetes mellitus with hyperglycemia: Secondary | ICD-10-CM | POA: Diagnosis not present

## 2022-10-27 DIAGNOSIS — Z7984 Long term (current) use of oral hypoglycemic drugs: Secondary | ICD-10-CM | POA: Insufficient documentation

## 2022-10-27 DIAGNOSIS — Z87891 Personal history of nicotine dependence: Secondary | ICD-10-CM | POA: Diagnosis not present

## 2022-10-27 DIAGNOSIS — E119 Type 2 diabetes mellitus without complications: Secondary | ICD-10-CM | POA: Insufficient documentation

## 2022-10-27 HISTORY — PX: ANAL FISTULOTOMY: SHX6423

## 2022-10-27 LAB — GLUCOSE, CAPILLARY
Glucose-Capillary: 302 mg/dL — ABNORMAL HIGH (ref 70–99)
Glucose-Capillary: 277 mg/dL — ABNORMAL HIGH (ref 70–99)

## 2022-10-27 SURGERY — ANAL FISTULOTOMY
Anesthesia: General

## 2022-10-27 MED ORDER — BUPIVACAINE HCL (PF) 0.5 % IJ SOLN
INTRAMUSCULAR | Status: DC | PRN
  Administered 2022-10-27: 30 mL

## 2022-10-27 MED ORDER — PROPOFOL 10 MG/ML IV BOLUS
INTRAVENOUS | Status: AC
  Filled 2022-10-27: qty 20

## 2022-10-27 MED ORDER — HYDROGEN PEROXIDE 3 % EX SOLN
CUTANEOUS | Status: DC | PRN
  Administered 2022-10-27: 1 via TOPICAL

## 2022-10-27 MED ORDER — PROPOFOL 10 MG/ML IV BOLUS
INTRAVENOUS | Status: DC | PRN
Start: 2022-10-27 — End: 2022-10-27
  Administered 2022-10-27: 20 mg via INTRAVENOUS
  Administered 2022-10-27: 180 mg via INTRAVENOUS

## 2022-10-27 MED ORDER — METHYLENE BLUE (ANTIDOTE) 1 % IV SOLN
INTRAVENOUS | Status: AC
  Filled 2022-10-27: qty 10

## 2022-10-27 MED ORDER — FENTANYL CITRATE PF 50 MCG/ML IJ SOSY: 25.0000 ug | PREFILLED_SYRINGE | INTRAMUSCULAR | Status: DC | PRN

## 2022-10-27 MED ORDER — OXYCODONE HCL 5 MG PO TABS: 5.0000 mg | ORAL_TABLET | Freq: Once | ORAL | Status: DC | PRN

## 2022-10-27 MED ORDER — FENTANYL CITRATE (PF) 100 MCG/2ML IJ SOLN
INTRAMUSCULAR | Status: AC
  Filled 2022-10-27: qty 2

## 2022-10-27 MED ORDER — ONDANSETRON HCL 4 MG/2ML IJ SOLN: 4.0000 mg | Freq: Once | INTRAMUSCULAR | Status: DC | PRN

## 2022-10-27 MED ORDER — OXYCODONE HCL 5 MG/5ML PO SOLN: 5.0000 mg | Freq: Once | ORAL | Status: DC | PRN

## 2022-10-27 MED ORDER — LIDOCAINE HCL (PF) 2 % IJ SOLN
INTRAMUSCULAR | Status: AC
  Filled 2022-10-27: qty 5

## 2022-10-27 MED ORDER — LACTATED RINGERS IV SOLN
INTRAVENOUS | Status: DC | PRN
Start: 2022-10-27 — End: 2022-10-27

## 2022-10-27 MED ORDER — BUPIVACAINE HCL (PF) 0.5 % IJ SOLN
INTRAMUSCULAR | Status: AC
  Filled 2022-10-27: qty 30

## 2022-10-27 MED ORDER — CHLORHEXIDINE GLUCONATE 0.12 % MT SOLN
OROMUCOSAL | Status: AC
  Filled 2022-10-27: qty 15

## 2022-10-27 MED ORDER — SODIUM CHLORIDE 0.9 % IR SOLN
Status: DC | PRN
  Administered 2022-10-27: 1000 mL

## 2022-10-27 MED ORDER — CHLORHEXIDINE GLUCONATE CLOTH 2 % EX PADS: 6.0000 | MEDICATED_PAD | Freq: Once | CUTANEOUS | Status: DC

## 2022-10-27 MED ORDER — FENTANYL CITRATE (PF) 100 MCG/2ML IJ SOLN
INTRAMUSCULAR | Status: DC | PRN
  Administered 2022-10-27 (×2): 50 ug via INTRAVENOUS

## 2022-10-27 MED ORDER — SUCCINYLCHOLINE CHLORIDE 200 MG/10ML IV SOSY
PREFILLED_SYRINGE | INTRAVENOUS | Status: AC
  Filled 2022-10-27: qty 10

## 2022-10-27 MED ORDER — LIDOCAINE HCL (CARDIAC) PF 100 MG/5ML IV SOSY
PREFILLED_SYRINGE | INTRAVENOUS | Status: DC | PRN
  Administered 2022-10-27: 60 mg via INTRAVENOUS

## 2022-10-27 MED ORDER — SODIUM CHLORIDE 0.9 % IV SOLN
2.0000 g | INTRAVENOUS | Status: AC
  Administered 2022-10-27: 2 g via INTRAVENOUS
  Filled 2022-10-27: qty 2

## 2022-10-27 MED ORDER — CHLORHEXIDINE GLUCONATE CLOTH 2 % EX PADS
6.0000 | MEDICATED_PAD | Freq: Once | CUTANEOUS | Status: AC
  Administered 2022-10-27: 6 via TOPICAL

## 2022-10-27 MED ORDER — LACTATED RINGERS IV SOLN: INTRAVENOUS | Status: DC

## 2022-10-27 SURGICAL SUPPLY — 30 items
BAG HAMPER (MISCELLANEOUS) ×1 IMPLANT
CANNULA VESSEL 3MM 2 BLNT TIP (CANNULA) ×1 IMPLANT
CLOTH BEACON ORANGE TIMEOUT ST (SAFETY) ×1 IMPLANT
COVER LIGHT HANDLE (MISCELLANEOUS) IMPLANT
ELECT REM PT RETURN 9FT ADLT (ELECTROSURGICAL) ×1
ELECTRODE REM PT RTRN 9FT ADLT (ELECTROSURGICAL) ×1 IMPLANT
GAUZE 4X4 16PLY ~~LOC~~+RFID DBL (SPONGE) IMPLANT
GAUZE SPONGE 4X4 12PLY STRL (GAUZE/BANDAGES/DRESSINGS) ×1 IMPLANT
GLOVE BIO SURGEON STRL SZ 6.5 (GLOVE) ×1 IMPLANT
GLOVE BIOGEL M 6.5 STRL (GLOVE) IMPLANT
GLOVE BIOGEL M 7.0 STRL (GLOVE) ×1 IMPLANT
GLOVE BIOGEL PI IND STRL 6.5 (GLOVE) ×1 IMPLANT
GLOVE BIOGEL PI IND STRL 7.0 (GLOVE) ×2 IMPLANT
GOWN STRL REUS W/TWL LRG LVL3 (GOWN DISPOSABLE) ×2 IMPLANT
HEMOSTAT SURGICEL 4X8 (HEMOSTASIS) ×1 IMPLANT
KIT TURNOVER CYSTO (KITS) ×1 IMPLANT
MANIFOLD NEPTUNE II (INSTRUMENTS) ×1 IMPLANT
NDL HYPO 21X1.5 SAFETY (NEEDLE) ×1 IMPLANT
NEEDLE HYPO 21X1.5 SAFETY (NEEDLE) ×1
NS IRRIG 1000ML POUR BTL (IV SOLUTION) ×1 IMPLANT
PACK PERI GYN (CUSTOM PROCEDURE TRAY) ×1 IMPLANT
PAD ABD 5X9 TENDERSORB (GAUZE/BANDAGES/DRESSINGS) IMPLANT
PAD ARMBOARD 7.5X6 YLW CONV (MISCELLANEOUS) ×1 IMPLANT
POSITIONER HEAD 8X9X4 ADT (SOFTGOODS) ×1 IMPLANT
SET BASIN LINEN APH (SET/KITS/TRAYS/PACK) ×1 IMPLANT
SURGILUBE 2OZ TUBE FLIPTOP (MISCELLANEOUS) ×1 IMPLANT
SUT SILK 0 FSL (SUTURE) ×1 IMPLANT
SUT VIC AB 2-0 CT2 27 (SUTURE) IMPLANT
SYR 30ML LL (SYRINGE) ×3 IMPLANT
VESSEL LOOPS MAXI RED (MISCELLANEOUS) IMPLANT

## 2022-10-27 NOTE — Discharge Instructions (Signed)
Post Operative Instructions after Fistulotomy -Keep your stools soft and have a BM daily. Take fiber (Metamucil) over the counter daily. Take Colace twice daily if fiber does not keep your stools soft.  Take Miralax as needed if you still have not had a BM in 2 Days. -Take Sitz Baths (warm water baths) every 4 hours and after every BM for the first week.  -After the first week, Sitz bath for discomfort and after BM.  -Starting 10/28/2022 can place neosporin into the fistulotomy track twice daily to help healing given his diabetes.  -Take Ibuprofen and Tylenol alternating and the Narcotic pain medication for breakthrough pain. -Some bleeding is normal, but if you have excessive bleeding, fevers, chills, or more pain than prior, call or go to the ED. -Wear a pad for drainage as needed.

## 2022-10-27 NOTE — Op Note (Signed)
[facility] Operative Note  10/27/22  Preoperative Diagnosis: Perianal fistula    Postoperative Diagnosis: Same   Procedure(s) Performed: Exam under anesthesia, superficial fistulotomy    Surgeon: Nathan Jewels. 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 He has had discharge since that abscess ruptured and discomfort. We discussed and exam under anesthesia and risk of bleeding, infection, need for seton, possible fistulotomy, possible injury to sphincter muscle.   Findings: 7 o'clock position perianal fistula superficial to the anal verge, no muscle involved, also tracked back to the gluteal cleft about 2cm    Procedure: The patient was taken to the operating room and placed supine. General endotracheal anesthesia was induced. Intravenous antibiotics were administered as he is a diabetic. He was then placed in lithotomy with all pressure points padded. The perianal and perineal region were prepped and draped in the usual sterile fashion.   On digital rectal exam I felt no masses or obvious internal points for the fistula. There was a small opening at the 7 o'clock position and I used a slick tip catheter and injected peroxide while having an anoscope inserted. No peroxide was noted internally. I readjusted and again did not get any peroxide internally. I gently probed the area with a mammary probe and noted a superficial track to the anal verge where a small opening was noted. A fistulotomy was performed using cut cautery. The track was then probed posteriorly and noted to go  toward the gluteal cleft for about 2cm.  The fistulotomy was extended in this region as this was likely the location of the abscess cavity. The cavity was curetted and cauterized to remove any epithelized tissue. No additional tracks were noted. Peroxide was used to irrigate the area. Marcaine was injected.   Final inspection revealed acceptable hemostasis. ABD and mesh panties were placed. All counts were correct at the end of the case. The patient was awakened from anesthesia and extubated without complication.  The patient went to the PACU in stable condition.   Nathan Greenhouse, MD [facility] [address] [place], [place] [address] [phone] (office)

## 2022-10-27 NOTE — Anesthesia Procedure Notes (Signed)
Procedure Name: LMA Insertion Date/Time: 10/27/2022 7:38 AM  Performed by: Franco Nones, CRNAPre-anesthesia Checklist: Patient identified, Patient being monitored, Timeout performed, Emergency Drugs available and Suction available Patient Re-evaluated:Patient Re-evaluated prior to induction Oxygen Delivery Method: Circle System Utilized Preoxygenation: Pre-oxygenation with 100% oxygen Induction Type: IV induction Ventilation: Mask ventilation without difficulty LMA: LMA inserted LMA Size: 4.0 Number of attempts: 1 Placement Confirmation: positive ETCO2 and breath sounds checked- equal and bilateral Tube secured with: Tape Dental Injury: Teeth and Oropharynx as per pre-operative assessment

## 2022-10-27 NOTE — Transfer of Care (Signed)
Immediate Anesthesia Transfer of Care Note  Patient: Nathan [name]  Procedure(s) Performed: ANAL FISTULOTOMY, EXAM UNDER ANESTHESIA  Patient Location: PACU  Anesthesia Type:General  Level of Consciousness: awake and patient cooperative  Airway & Oxygen Therapy: Patient Spontanous Breathing and Patient connected to face mask oxygen  Post-op Assessment: Report given to RN and Post -op Vital signs reviewed and stable  Post vital signs: Reviewed and stable  Last Vitals:  Vitals Value Taken Time  BP 132/93 10/27/22 0820  Temp 97.8 10/27/2022 0822  Pulse 72 10/27/22 0822  Resp 20 10/27/22 0822  SpO2 99 % 10/27/22 0822  Vitals shown include unfiled device data.  Last Pain:  Vitals:   10/27/22 0639  TempSrc: Oral  PainSc: 0-No pain         Complications: No notable events documented.

## 2022-10-27 NOTE — Interval H&P Note (Signed)
History and Physical Interval Note:  10/27/2022 7:25 AM  Nathan [name]  has presented today for surgery, with the diagnosis of PERIANAL FISTULA.  The various methods of treatment have been discussed with the patient and family. After consideration of risks, benefits and other options for treatment, the patient has consented to  Procedure(s): ANAL FISTULOTOMY,EXAM UNDER ANESTHESIA POSSIBLE SETON (N/A) as a surgical intervention.  The patient's history has been reviewed, patient examined, no change in status, stable for surgery.  I have reviewed the patient's chart and labs.  Questions were answered to the patient's satisfaction.     Lucretia Roers

## 2022-10-27 NOTE — Anesthesia Postprocedure Evaluation (Signed)
Anesthesia Post Note  Patient: Nathan [name]  Procedure(s) Performed: ANAL FISTULOTOMY, EXAM UNDER ANESTHESIA  Patient location during evaluation: PACU Anesthesia Type: General Level of consciousness: awake and alert Pain management: pain level controlled Vital Signs Assessment: post-procedure vital signs reviewed and stable Respiratory status: spontaneous breathing, nonlabored ventilation, respiratory function stable and patient connected to nasal cannula oxygen Cardiovascular status: blood pressure returned to baseline and stable Postop Assessment: no apparent nausea or vomiting Anesthetic complications: no   There were no known notable events for this encounter.   Last Vitals:  Vitals:   10/27/22 0837 10/27/22 0845  BP: (!) 169/106 (!) 178/112  Pulse: 91 82  Resp: (!) 9 11  Temp:    SpO2: 98% 95%    Last Pain:  Vitals:   10/27/22 0845  TempSrc:   PainSc: 0-No pain                 Chanz Cahall L Miski Feldpausch

## 2022-10-27 NOTE — Progress Notes (Signed)
Dr.Ewell notified of patient's CBG 277 and BP 178/112 in PACU. No new orders given, Dr.Ewell states ok for discharge.

## 2022-10-27 NOTE — Progress Notes (Signed)
[facility]  Updated [place]. Plan for sitz baths every 4 hours and after Bms for the first week and then at least daily and after Bms after the first week. Can place neosporin into the fistulotomy track twice daily to help given his diabetes.   Manage blood sugar.  Will check on next week.   Algis Greenhouse, MD [facility] [address] [place], [place] [address] [phone] (office)

## 2022-11-01 ENCOUNTER — Encounter (HOSPITAL_COMMUNITY): Payer: Self-pay | Admitting: General Surgery

## 2022-11-03 ENCOUNTER — Ambulatory Visit: Payer: Medicaid Other | Admitting: General Surgery

## 2022-11-03 ENCOUNTER — Encounter: Payer: Self-pay | Admitting: General Surgery

## 2022-11-03 VITALS — BP 162/108 | HR 93 | Temp 97.4°F | Resp 16 | Ht 71.0 in | Wt 208.0 lb

## 2022-11-03 DIAGNOSIS — K603 Anal fistula, unspecified: Secondary | ICD-10-CM

## 2022-11-04 NOTE — Progress Notes (Signed)
[facility]  Doing well. Doing sitz baths.  BP (!) 162/108   Pulse 93   Temp (!) 97.4 F (36.3 C) (Oral)   Resp 16   Ht 5\' 11"  (1.803 m)   Wt 208 lb (94.3 kg)   SpO2 97%   BMI 29.01 kg/m  Fistulotomy site with minor drainage, starting to granulate   Patient s/p fistulotomy. Doing well.  Continue sitz baths and wound care. Can do neosporin in the deepest parts as able.  Future Appointments  Date Time Provider Department Center  11/17/2022  1:15 PM Lucretia Roers, MD [facility] None    Algis Greenhouse, MD [facility] [address] [place], [place] [address] [phone] (office)

## 2022-11-17 ENCOUNTER — Ambulatory Visit: Payer: Medicaid Other | Admitting: General Surgery

## 2022-11-17 ENCOUNTER — Encounter: Payer: Self-pay | Admitting: General Surgery

## 2022-11-17 VITALS — BP 173/99 | HR 90 | Temp 97.5°F | Resp 14 | Ht 71.0 in | Wt 210.0 lb

## 2022-11-17 DIAGNOSIS — K603 Anal fistula, unspecified: Secondary | ICD-10-CM

## 2022-11-17 NOTE — Patient Instructions (Signed)
Continue sitz baths. Can do activity as tolerated. Keep stool regular and soft.

## 2022-11-18 NOTE — Progress Notes (Signed)
[facility]  Doing well and feeling good.   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 Doing well.   Continue sitz baths. Can do activity as tolerated. Keep stool regular and soft.   Algis Greenhouse, MD [facility] [address] [place], [place] [address] [phone] (office)

## 2022-12-01 ENCOUNTER — Encounter: Payer: Medicaid Other | Admitting: General Surgery

## 2022-12-13 ENCOUNTER — Encounter: Payer: Self-pay | Admitting: General Surgery

## 2022-12-13 ENCOUNTER — Ambulatory Visit (INDEPENDENT_AMBULATORY_CARE_PROVIDER_SITE_OTHER): Payer: Medicaid Other | Admitting: General Surgery

## 2022-12-13 VITALS — BP 139/91 | HR 82 | Temp 97.6°F | Resp 16 | Ht 71.0 in | Wt 211.0 lb

## 2022-12-13 DIAGNOSIS — K603 Anal fistula, unspecified: Secondary | ICD-10-CM

## 2022-12-13 NOTE — Patient Instructions (Signed)
Sitz baths a needed.

## 2022-12-14 NOTE — Progress Notes (Signed)
[facility]  Overall improving. Having regular Bms. Did have some minor blood the other day.  BP (!) 139/91   Pulse 82   Temp 97.6 F (36.4 C) (Oral)   Resp 16   Ht 5\' 11"  (1.803 m)   Wt 211 lb (95.7 kg)   SpO2 98%   BMI 29.43 kg/m  Fistulotomy site healing, small opening <1cm in size and <1cm deep  Patient s/p fistulotomy doing well.  Sitz baths as needed Should heal in next 4 weeks  Future Appointments  Date Time Provider Department Center  01/10/2023  9:15 AM Lucretia Roers, MD [facility] None    Algis Greenhouse, MD [facility] [address] [place], [place] [address] [phone] (office)

## 2023-01-10 ENCOUNTER — Encounter: Payer: Medicaid Other | Admitting: General Surgery

## 2023-02-10 ENCOUNTER — Emergency Department (HOSPITAL_BASED_OUTPATIENT_CLINIC_OR_DEPARTMENT_OTHER): Payer: Medicaid Other | Admitting: Radiology

## 2023-02-10 ENCOUNTER — Encounter (HOSPITAL_BASED_OUTPATIENT_CLINIC_OR_DEPARTMENT_OTHER): Payer: Self-pay

## 2023-02-10 ENCOUNTER — Emergency Department (HOSPITAL_BASED_OUTPATIENT_CLINIC_OR_DEPARTMENT_OTHER)
Admission: EM | Admit: 2023-02-10 | Discharge: 2023-02-10 | Disposition: A | Discharge status: Home / Self Care | Payer: Medicaid Other

## 2023-02-10 ENCOUNTER — Other Ambulatory Visit: Payer: Self-pay

## 2023-02-10 DIAGNOSIS — I10 Essential (primary) hypertension: Secondary | ICD-10-CM | POA: Insufficient documentation

## 2023-02-10 DIAGNOSIS — R55 Syncope and collapse: Secondary | ICD-10-CM | POA: Insufficient documentation

## 2023-02-10 DIAGNOSIS — Z87891 Personal history of nicotine dependence: Secondary | ICD-10-CM | POA: Diagnosis not present

## 2023-02-10 DIAGNOSIS — M791 Myalgia, unspecified site: Secondary | ICD-10-CM | POA: Insufficient documentation

## 2023-02-10 DIAGNOSIS — E039 Hypothyroidism, unspecified: Secondary | ICD-10-CM | POA: Insufficient documentation

## 2023-02-10 DIAGNOSIS — E871 Hypo-osmolality and hyponatremia: Secondary | ICD-10-CM | POA: Diagnosis not present

## 2023-02-10 DIAGNOSIS — R0981 Nasal congestion: Secondary | ICD-10-CM | POA: Diagnosis not present

## 2023-02-10 DIAGNOSIS — Z20822 Contact with and (suspected) exposure to covid-19: Secondary | ICD-10-CM | POA: Insufficient documentation

## 2023-02-10 DIAGNOSIS — E119 Type 2 diabetes mellitus without complications: Secondary | ICD-10-CM | POA: Diagnosis not present

## 2023-02-10 DIAGNOSIS — R051 Acute cough: Secondary | ICD-10-CM | POA: Diagnosis not present

## 2023-02-10 DIAGNOSIS — R059 Cough, unspecified: Secondary | ICD-10-CM | POA: Diagnosis present

## 2023-02-10 DIAGNOSIS — R052 Subacute cough: Secondary | ICD-10-CM

## 2023-02-10 DIAGNOSIS — Z79899 Other long term (current) drug therapy: Secondary | ICD-10-CM | POA: Insufficient documentation

## 2023-02-10 LAB — COMPREHENSIVE METABOLIC PANEL
ALT: 30 U/L (ref 0–44)
AST: 14 U/L — ABNORMAL LOW (ref 15–41)
Albumin: 4.2 g/dL (ref 3.5–5.0)
Alkaline Phosphatase: 91 U/L (ref 38–126)
Anion gap: 11 (ref 5–15)
BUN: 12 mg/dL (ref 6–20)
CO2: 25 mmol/L (ref 22–32)
Calcium: 9.7 mg/dL (ref 8.9–10.3)
Chloride: 98 mmol/L (ref 98–111)
Creatinine, Ser: 0.69 mg/dL (ref 0.61–1.24)
GFR, Estimated: >60 mL/min (ref >60)
Glucose, Bld: 377 mg/dL — ABNORMAL HIGH (ref 70–99)
Potassium: 4.2 mmol/L (ref 3.5–5.1)
Sodium: 134 mmol/L — ABNORMAL LOW (ref 135–145)
Total Bilirubin: 0.8 mg/dL (ref 0.0–1.2)
Total Protein: 7.1 g/dL (ref 6.5–8.1)

## 2023-02-10 LAB — CBC WITH DIFFERENTIAL/PLATELET
Abs Immature Granulocytes: 0.03 10*3/uL (ref 0.00–0.07)
Basophils Absolute: 0 10*3/uL (ref 0.0–0.1)
Basophils Relative: 0 %
Eosinophils Absolute: 0.2 10*3/uL (ref 0.0–0.5)
Eosinophils Relative: 4 %
HCT: 47.3 % (ref 39.0–52.0)
Hemoglobin: 17.1 g/dL — ABNORMAL HIGH (ref 13.0–17.0)
Immature Granulocytes: 1 %
Lymphocytes Relative: 27 %
Lymphs Abs: 1.5 10*3/uL (ref 0.7–4.0)
MCH: 30.1 pg (ref 26.0–34.0)
MCHC: 36.2 g/dL — ABNORMAL HIGH (ref 30.0–36.0)
MCV: 83.1 fL (ref 80.0–100.0)
Monocytes Absolute: 0.6 10*3/uL (ref 0.1–1.0)
Monocytes Relative: 11 %
Neutro Abs: 3.3 10*3/uL (ref 1.7–7.7)
Neutrophils Relative %: 57 %
Platelets: 253 10*3/uL (ref 150–400)
RBC: 5.69 MIL/uL (ref 4.22–5.81)
RDW: 12.1 % (ref 11.5–15.5)
WBC: 5.8 10*3/uL (ref 4.0–10.5)
nRBC: 0 % (ref 0.0–0.2)

## 2023-02-10 LAB — RESP PANEL BY RT-PCR (RSV, FLU A&B, COVID)  RVPGX2
Influenza A by PCR: NEGATIVE
Influenza B by PCR: NEGATIVE
Resp Syncytial Virus by PCR: NEGATIVE
SARS Coronavirus 2 by RT PCR: NEGATIVE

## 2023-02-10 MED ORDER — AZITHROMYCIN 250 MG PO TABS: 250.0000 mg | ORAL_TABLET | Freq: Every day | ORAL | 0 refills | Status: DC

## 2023-02-10 MED ORDER — ALBUTEROL SULFATE HFA 108 (90 BASE) MCG/ACT IN AERS: 2.0000 | INHALATION_SPRAY | RESPIRATORY_TRACT | Status: DC | PRN

## 2023-02-10 MED ORDER — PROMETHAZINE-DM 6.25-15 MG/5ML PO SYRP: 2.5000 mL | ORAL_SOLUTION | Freq: Four times a day (QID) | ORAL | 0 refills | Status: DC | PRN

## 2023-02-10 MED ORDER — PREDNISONE 20 MG PO TABS: 40.0000 mg | ORAL_TABLET | Freq: Every day | ORAL | 0 refills | Status: AC

## 2023-02-10 NOTE — Discharge Instructions (Addendum)
As discussed, workup today overall reassuring.  Your chest x-ray showed possible infiltrate so we will treat this with antibiotics in outpatient setting.  Will put you on prednisone for the next few days to help with breathing as well as cough suppressant to use as needed.  He may also find over-the-counter Mucinex beneficial to help thin secretions.  Recommend follow-up with your primary care for reassessment.  Please do not hesitate to return if the worrisome signs and symptoms we discussed become apparent.

## 2023-02-10 NOTE — ED Triage Notes (Signed)
Pt w wife, states pt "passed out in the car awhile ago." Recent URI infection, wife states that he had coughing fit "& just coughed so much that he couldn't get his breath." Pt advises "I feel much better now than I did."

## 2023-02-10 NOTE — ED Provider Notes (Signed)
[name] EMERGENCY DEPARTMENT AT [facility] Provider Note   CSN: [id number] Arrival date & time: 02/10/23  1216     History  Chief Complaint  Patient presents with   Loss of Consciousness   URI    Nathan [name] is a 48 y.o. male.   Loss of Consciousness URI   48 year old male presents emergency department with complaints of cough, syncopal episode.  Patient states that he has been ill for the past 2 weeks ago with cough, nasal congestion, body aches.  Patient states that his entire family has been ill with the same symptoms over the same amount of time.  Patient has been trying at home moonshine with honey and it which states has been helping with his cough.  States that his symptoms are generally improving.  Was on his way to his wife's doctor's appointment when he began with a coughing fit.  States that he was unable to catch his breath and subsequently lost consciousness leaning forward on the dashboard.  Was only out for a matter of seconds before regaining consciousness and feeling at baseline.  Presents emergency department for further assessment.  Denies any chest pain, shortness of breath, abdominal pain, nausea, vomiting, recent fever in the past few days.  States that his only symptom for the past several days has been cough.  Past medical history significant for hypertension, hyperlipidemia, cardiac arrest, diabetes mellitus, hypothyroidism  Home Medications Prior to Admission medications   Medication Sig Start Date End Date Taking? Authorizing Provider  azithromycin (ZITHROMAX) 250 MG tablet Take 1 tablet (250 mg total) by mouth daily. Take first 2 tablets together, then 1 every day until finished. 02/10/23  Yes Sherian Maroon A, PA  predniSONE (DELTASONE) 20 MG tablet Take 2 tablets (40 mg total) by mouth daily with breakfast for 6 days. 02/10/23 02/16/23 Yes Sherian Maroon A, PA  promethazine-dextromethorphan (PROMETHAZINE-DM) 6.25-15 MG/5ML syrup Take 2.5  mLs by mouth 4 (four) times daily as needed for cough. 02/10/23  Yes Sherian Maroon A, PA  amLODipine (NORVASC) 10 MG tablet Take 1 tablet (10 mg total) by mouth daily. 02/28/17   Roma Kayser, MD  Coenzyme Q10 (COQ10) 100 MG CAPS Take by mouth.    [provider]  empagliflozin (JARDIANCE) 25 MG TABS tablet Take 1 tablet (25 mg total) by mouth daily before breakfast. 02/06/20   Romero Belling, MD  glimepiride (AMARYL) 4 MG tablet Take 8 mg by mouth daily. 07/31/22   [provider]  LANTUS SOLOSTAR 100 UNIT/ML Solostar Pen SMARTSIG:20-80 Unit(s) SUB-Q Every Night 09/30/22   [provider]  levothyroxine (SYNTHROID) 88 MCG tablet TAKE 1 TABLET BY MOUTH ONCE DAILY BEFORE BREAKFAST 08/16/21   Reather Littler, MD  rosuvastatin (CRESTOR) 10 MG tablet Take 1 tablet by mouth daily.    [provider]  testosterone cypionate (DEPOTESTOSTERONE CYPIONATE) 200 MG/ML injection Inject 200 mg into the muscle every 14 (fourteen) days. 10/02/22   [provider]      Allergies    Benzocaine, Orange juice [orange oil], Flavoring agent (non-screening), and Grapefruit concentrate    Review of Systems   Review of Systems  Cardiovascular:  Positive for syncope.  All other systems reviewed and are negative.   Physical Exam Updated Vital Signs BP (!) 160/106   Pulse (!) 109   Temp 98.2 F (36.8 C) (Oral)   Resp 16   SpO2 97%  Physical Exam Vitals and nursing note reviewed.  Constitutional:      General:  He is not in acute distress.    Appearance: He is well-developed.  HENT:     Head: Normocephalic and atraumatic.  Eyes:     Conjunctiva/sclera: Conjunctivae normal.  Cardiovascular:     Rate and Rhythm: Normal rate and regular rhythm.     Heart sounds: No murmur heard. Pulmonary:     Effort: Pulmonary effort is normal. No respiratory distress.     Comments: Slight wheeze appreciated bilateral lung fields. Abdominal:     Palpations: Abdomen is soft.      Tenderness: There is no abdominal tenderness.  Musculoskeletal:        General: No swelling.     Cervical back: Neck supple.  Skin:    General: Skin is warm and dry.     Capillary Refill: Capillary refill takes less than 2 seconds.  Neurological:     Mental Status: He is alert.     Comments: Alert and oriented to self, place, time and event.   Speech is fluent, clear without dysarthria or dysphasia.   Strength 5/5 in upper/lower extremities   Sensation intact in upper/lower extremities   Normal gait.  CN I not tested  CN II not tested CN III, IV, VI PERRLA and EOMs intact bilaterally  CN V Intact sensation to sharp and light touch to the face  CN VII facial movements symmetric  CN VIII not tested  CN IX, X no uvula deviation, symmetric rise of soft palate  CN XI 5/5 SCM and trapezius strength bilaterally  CN XII Midline tongue protrusion, symmetric L/R movements     Psychiatric:        Mood and Affect: Mood normal.     ED Results / Procedures / Treatments   Labs (all labs ordered are listed, but only abnormal results are displayed) Labs Reviewed  COMPREHENSIVE METABOLIC PANEL - Abnormal; Notable for the following components:      Result Value   Sodium 134 (*)    Glucose, Bld 377 (*)    AST 14 (*)    All other components within normal limits  CBC WITH DIFFERENTIAL/PLATELET - Abnormal; Notable for the following components:   Hemoglobin 17.1 (*)    MCHC 36.2 (*)    All other components within normal limits  RESP PANEL BY RT-PCR (RSV, FLU A&B, COVID)  [id number]    EKG EKG Interpretation Date/Time:  Friday February 10 2023 12:40:50 EST Ventricular Rate:  97 PR Interval:  140 QRS Duration:  74 QT Interval:  322 QTC Calculation: 408 R Axis:   20  Text Interpretation: Normal sinus rhythm Nonspecific ST abnormality Abnormal ECG When compared with ECG of 26-Oct-2022 14:02, No significant change was found Confirmed by Beckey Downing [phone]) on 02/10/2023 12:53:29  PM  Radiology DG Chest 2 View Result Date: 02/10/2023 CLINICAL DATA:  Shortness of breath. EXAM: CHEST - 2 VIEW COMPARISON:  January 23, 2021. FINDINGS: The heart size and mediastinal contours are within normal limits. Both lungs are clear. The visualized skeletal structures are unremarkable. IMPRESSION: No active cardiopulmonary disease. Electronically Signed   By: Lupita Raider M.D.   On: 02/10/2023 14:37    Procedures Procedures    Medications Ordered in ED Medications - No data to display  ED Course/ Medical Decision Making/ A&P                                 Medical Decision Making Amount and/or Complexity of  Data Reviewed Labs: ordered. Radiology: ordered.  Risk Prescription drug management.   This patient presents to the ED for concern of cough, syncope, this involves an extensive number of treatment options, and is a complaint that carries with it a high risk of complications and morbidity.  The differential diagnosis includes URI, GERD, asthma, COPD, CHF, vasovagal, orthostatic hypotension, seizure, medication side effect, arrhythmia, other   Co morbidities that complicate the patient evaluation  See HPI   Additional history obtained:  Additional history obtained from EMR External records from outside source obtained and reviewed including hospital records   Lab Tests:  I Ordered, and personally interpreted labs.  The pertinent results include: No leukocytosis.  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  No platelets within normal range.  Mild hyponatremia of 134 but otherwise, S within normal limits.  No renal dysfunction.  No transaminitis.  Viral panel negative.   Imaging Studies ordered:  I ordered imaging studies including chest x-ray I independently visualized and interpreted imaging which showed no acute cardiopulmonary abnormalities I agree with the radiologist interpretation   Cardiac Monitoring: / EKG:  The patient was maintained on a cardiac monitor.   I personally viewed and interpreted the cardiac monitored which showed an underlying rhythm of: Sinus rhythm nonspecific ST changes.  Similar in appearance to prior EKG performed.   Consultations Obtained:  N/a   Problem List / ED Course / Critical interventions / Medication management  Cough, syncope Reevaluation of the patient showed that the patient stayed the same I have reviewed the patients home medicines and have made adjustments as needed   Social Determinants of Health:  Former cigarette use.  Currently vapes.  Denies other substance use.   Test / Admission - Considered:  Cough, syncope Vitals signs significant for hypertension blood pressure 142/94. Otherwise within normal range and stable throughout visit. Laboratory/imaging studies significant for: See above 48 year old male presents emergency department with complaints of cough and syncopal episode.  Cough present for the past 2 weeks with multiple foot members ill at home with similar symptoms.  On exam, faint wheeze auscultated bilateral lung fields.  Labs reassuring.  Chest x-ray without any acute cardiopulmonary abnormality.  Viral testing negative.  Treated with breathing treatment and noted improvement of symptoms.  Some concern for bronchitis.  Will treat with continued use of albuterol inhaler at home as well as prednisone.  Patient's significant other at bedside states she has multiple extra albuterol inhalers and additional prescription is not needed.  Regarding patient's syncopal episode, suspect vasovagal as patient was in a "coughing fit" with inability to catch breath and subsequently passed out.  No trauma from incident necessitating imaging studies.  Will recommend follow-up with primary care for reassessment outpatient setting regarding symptoms.  Treatment plan discussed at length with patient and he acknowledged understanding was agreeable to said plan.  Patient overall well-appearing, afebrile in no acute  distress. Worrisome signs and symptoms were discussed with the patient, and the patient acknowledged understanding to return to the ED if noticed. Patient was stable upon discharge.          Final Clinical Impression(s) / ED Diagnoses Final diagnoses:  Subacute cough  Vasovagal syncope    Rx / DC Orders      Peter Garter, [place] 02/10/23 1843    Durwin Glaze, MD 02/13/23 [phone]

## 2023-03-07 ENCOUNTER — Ambulatory Visit: Payer: Medicaid Other | Admitting: General Surgery

## 2023-03-22 ENCOUNTER — Ambulatory Visit: Payer: Medicaid Other | Admitting: Urology

## 2023-04-04 ENCOUNTER — Other Ambulatory Visit (HOSPITAL_COMMUNITY): Payer: Self-pay | Admitting: Internal Medicine

## 2023-04-04 DIAGNOSIS — G44009 Cluster headache syndrome, unspecified, not intractable: Secondary | ICD-10-CM

## 2023-04-06 LAB — HEMOGLOBIN A1C: Hemoglobin A1C: 9.8

## 2023-04-06 LAB — BASIC METABOLIC PANEL WITH GFR
BUN: 14 (ref 4–21)
Creatinine: 0.9 (ref 0.6–1.3)
Glucose: 325

## 2023-04-06 LAB — TSH: TSH: 2.64 (ref 0.41–5.90)

## 2023-04-06 LAB — LIPID PANEL
LDL Cholesterol: 110
Triglycerides: 522 — AB (ref 40–160)

## 2023-04-06 LAB — COMPREHENSIVE METABOLIC PANEL WITH GFR: eGFR: 107

## 2023-04-06 LAB — VITAMIN D 25 HYDROXY (VIT D DEFICIENCY, FRACTURES): Vit D, 25-Hydroxy: 21

## 2023-04-06 LAB — MICROALBUMIN / CREATININE URINE RATIO: Microalb Creat Ratio: 24

## 2023-04-08 ENCOUNTER — Ambulatory Visit (HOSPITAL_COMMUNITY)
Admission: RE | Admit: 2023-04-08 | Discharge: 2023-04-08 | Disposition: A | Discharge status: Home / Self Care | Source: Ambulatory Visit | Attending: Internal Medicine | Admitting: Internal Medicine

## 2023-04-08 DIAGNOSIS — G44009 Cluster headache syndrome, unspecified, not intractable: Secondary | ICD-10-CM

## 2023-04-28 ENCOUNTER — Telehealth: Payer: Self-pay | Admitting: *Deleted

## 2023-04-28 ENCOUNTER — Encounter: Payer: Self-pay | Admitting: Urology

## 2023-04-28 ENCOUNTER — Ambulatory Visit: Payer: Medicaid Other | Admitting: Urology

## 2023-04-28 VITALS — BP 157/91 | HR 93

## 2023-04-28 DIAGNOSIS — N529 Male erectile dysfunction, unspecified: Secondary | ICD-10-CM | POA: Diagnosis not present

## 2023-04-28 MED ORDER — TADALAFIL 20 MG PO TABS: 20.0000 mg | ORAL_TABLET | ORAL | 5 refills | Status: DC | PRN

## 2023-04-28 NOTE — Patient Instructions (Signed)

## 2023-04-28 NOTE — Telephone Encounter (Signed)
 Patient came by office for evaluation.   Reports that perianal fistula hs reopened. 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 Reports that area became irritated, red and swollen. States that some clear fluid drained from opening.   Patient agreeable to writer evaluating area of concern. Noted small 2 mm opening in the top of prior fistula site. No drainage noted at this time.   Surrounding tissues appear WNL. Area is not tender to palpation, but feels hard under fistula opening, possible tract or scar tissue.  Advised to use sitz baths as needed for discomfort. Advised to contact office for any S/Sx of infection. Appointment scheduled with Dr. Henreitta Leber.

## 2023-04-28 NOTE — Progress Notes (Signed)
 04/28/2023 11:39 AM   Nathan [name] 08/22/1975 [id number]  Referring provider: Kathyleen Parkins, MD [address] [place],  [place] [address]  Erectile dysfunction   HPI: Nathan [name] is a 47yo here for evaluation of erectile dysfunction. He on testosterone replacement therapy for the past 6 months. He has noted no improvement in his erectile dysfunction on TRT. Good libido. He has previously tried sildenafil 20mg  without success. He has a hx of DMII for 15 years and most recent A1c is 9.8.    PMH: Past Medical History:  Diagnosis Date   Cardiac arrest [facility])    Chest pain    Diabetes mellitus    Dysrhythmia    HLD (hyperlipidemia)    Hypertension    Obesity    Pneumonia    Sleep apnea    Syncope    Thyroid  disease    TMJ (dislocation of temporomandibular joint)     Surgical History: Past Surgical History:  Procedure Laterality Date   ANAL FISTULOTOMY N/A 10/27/2022   Procedure: ANAL FISTULOTOMY, EXAM UNDER ANESTHESIA;  Surgeon: Awilda Bogus, MD;  Location: [facility];  Service: General;  Laterality: N/A;   CARDIAC CATHETERIZATION     no blockage   HAND SURGERY      Home Medications:  Allergies as of 04/28/2023       Reactions   Benzocaine Anaphylaxis, Other (See Comments)   REACTION: Blistering all over and throat swells shut   Orange Juice [orange Oil] Anaphylaxis   Flavoring Agent (non-screening)    Grapefruit Concentrate         Medication List        Accurate as of April 28, 2023 11:39 AM. If you have any questions, ask your nurse or doctor.          amLODipine  10 MG tablet Commonly known as: NORVASC  Take 1 tablet (10 mg total) by mouth daily.   azithromycin  250 MG tablet Commonly known as: ZITHROMAX  Take 1 tablet (250 mg total) by mouth daily. Take first 2 tablets together, then 1 every day until finished.   CoQ10 100 MG Caps Take by mouth.   empagliflozin  25 MG Tabs tablet Commonly known as: Jardiance  Take 1 tablet (25 mg total)  by mouth daily before breakfast.   glimepiride 4 MG tablet Commonly known as: AMARYL Take 8 mg by mouth daily.   Lantus SoloStar 100 UNIT/ML Solostar Pen Generic drug: insulin  glargine SMARTSIG:20-80 Unit(s) SUB-Q Every Night   levothyroxine  88 MCG tablet Commonly known as: SYNTHROID  TAKE 1 TABLET BY MOUTH ONCE DAILY BEFORE BREAKFAST   promethazine -dextromethorphan 6.25-15 MG/5ML syrup Commonly known as: PROMETHAZINE -DM Take 2.5 mLs by mouth 4 (four) times daily as needed for cough.   rosuvastatin 10 MG tablet Commonly known as: CRESTOR Take 1 tablet by mouth daily.   testosterone cypionate 200 MG/ML injection Commonly known as: DEPOTESTOSTERONE CYPIONATE Inject 200 mg into the muscle every 14 (fourteen) days.        Allergies:  Allergies  Allergen Reactions   Benzocaine Anaphylaxis and Other (See Comments)    REACTION: Blistering all over and throat swells shut   Orange Juice [Orange Oil] Anaphylaxis   Flavoring Agent (Non-Screening)    Grapefruit Concentrate     Family History: Family History  Problem Relation Age of Onset   Heart failure Mother    Diabetes Mother    Heart attack Father        x5    Diabetes Father     Social History:  reports  that he quit smoking about 15 years ago. His smoking use included cigarettes. He started smoking about 21 years ago. He has never used smokeless tobacco. He reports that he does not currently use alcohol after a past usage of about 4.0 standard drinks of alcohol per week. He reports that he does not use drugs.  ROS: All other review of systems were reviewed and are negative except what is noted above in HPI  Physical Exam: BP (!) 157/91   Pulse 93   Constitutional:  Alert and oriented, No acute distress. HEENT: [place] AT, moist mucus membranes.  Trachea midline, no masses. Cardiovascular: No clubbing, cyanosis, or edema. Respiratory: Normal respiratory effort, no increased work of breathing. GI: Abdomen is soft,  nontender, nondistended, no abdominal masses GU: No CVA tenderness.  Lymph: No cervical or inguinal lymphadenopathy. Skin: No rashes, bruises or suspicious lesions. Neurologic: Grossly intact, no focal deficits, moving all 4 extremities. Psychiatric: Normal mood and affect.  Laboratory Data: Lab Results  Component Value Date   WBC 5.8 02/10/2023   HGB 17.1 (H) 02/10/2023   HCT 47.3 02/10/2023   MCV 83.1 02/10/2023   PLT 253 02/10/2023    Lab Results  Component Value Date   CREATININE 0.69 02/10/2023    No results found for: "PSA"  Lab Results  Component Value Date   TESTOSTERONE 327 02/06/2020    Lab Results  Component Value Date   HGBA1C 10.6 (A) 01/29/2021    Urinalysis    Component Value Date/Time   COLORURINE YELLOW 10/30/2020 1935   APPEARANCEUR CLEAR 10/30/2020 1935   LABSPEC 1.025 10/30/2020 1935   PHURINE 5.0 10/30/2020 1935   GLUCOSEU >=500 (A) 10/30/2020 1935   HGBUR NEGATIVE 10/30/2020 1935   BILIRUBINUR NEGATIVE 10/30/2020 1935   KETONESUR NEGATIVE 10/30/2020 1935   PROTEINUR 30 (A) 10/30/2020 1935   NITRITE NEGATIVE 10/30/2020 1935   LEUKOCYTESUR NEGATIVE 10/30/2020 1935    Lab Results  Component Value Date   LABMICR 19.8 12/22/2015   BACTERIA NONE SEEN 10/30/2020    Pertinent Imaging:  No results found for this or any previous visit.  No results found for this or any previous visit.  No results found for this or any previous visit.  No results found for this or any previous visit.  No results found for this or any previous visit.  No results found for this or any previous visit.  No results found for this or any previous visit.  Results for orders placed during the hospital encounter of 05/25/18  CT Renal Stone Study  Narrative CLINICAL DATA:  Rt sided flank, lower back pain since Monday. Denies blood in urine. Patient had been seen at [facility] on Monday, prescribed muscle "relaxers" after "negative xray". History of  DM, HTN. right lower back pain for four days. Eval for ureteral calculi vs lumbar issue.  EXAM: CT ABDOMEN AND PELVIS WITHOUT CONTRAST  TECHNIQUE: Multidetector CT imaging of the abdomen and pelvis was performed following the standard protocol without IV contrast.  COMPARISON:  None.  FINDINGS: Lower chest: Lung bases are clear.  Hepatobiliary: No focal hepatic lesion. Normal gallbladder. No biliary duct dilatation  Pancreas: Pancreas is normal. No ductal dilatation. No pancreatic inflammation.  Spleen: Normal spleen  Adrenals/urinary tract: Adrenal glands and kidneys are normal. The ureters and bladder normal.  Stomach/Bowel: Stomach, small bowel, appendix, and cecum are normal. The colon and rectosigmoid colon are normal.  Vascular/Lymphatic: Abdominal aorta is normal caliber. No periportal or retroperitoneal adenopathy. No pelvic adenopathy.  Reproductive: Prostate normal  Other: No free fluid.  Musculoskeletal: No aggressive osseous lesion. Normal alignment lumbar spine. No subluxation.  IMPRESSION: 1. No ureterolithiasis or obstructive uropathy. 2. No acute findings lumbar spine.   Electronically Signed By: Deboraha Fallow M.D. On: 05/25/2018 14:37   Assessment & Plan:    1. Erectile dysfunction, unspecified erectile dysfunction type (Primary) -We will trial tadalafil  20mg     No follow-ups on file.  Johnie Nailer, MD  [facility]

## 2023-05-03 ENCOUNTER — Ambulatory Visit: Admitting: General Surgery

## 2023-06-01 ENCOUNTER — Ambulatory Visit: Admitting: General Surgery

## 2023-06-07 ENCOUNTER — Ambulatory Visit: Admitting: Urology

## 2023-06-20 ENCOUNTER — Other Ambulatory Visit: Payer: Self-pay

## 2023-06-20 ENCOUNTER — Ambulatory Visit (INDEPENDENT_AMBULATORY_CARE_PROVIDER_SITE_OTHER): Admitting: General Surgery

## 2023-06-20 ENCOUNTER — Encounter: Payer: Self-pay | Admitting: General Surgery

## 2023-06-20 VITALS — BP 151/97 | HR 79 | Temp 98.5°F | Resp 18 | Ht 71.0 in | Wt 210.0 lb

## 2023-06-20 DIAGNOSIS — K603 Anal fistula, unspecified: Secondary | ICD-10-CM | POA: Diagnosis not present

## 2023-06-20 NOTE — Progress Notes (Signed)
 [facility] History and Physical  Reason for Referral: Recurrent Anal Fistula  Referring Physician: Self   Chief Complaint   Follow-up     Nathan [name] is a 48 y.o. male.  HPI:   Discussed the use of AI scribe software for clinical note transcription with the patient, who gave verbal consent to proceed.  History of Present Illness Nathan [name] is a 48 year old male with a history of fistula surgery who presents with recurrent drainage and swelling at the surgical site.  He underwent fistula surgery in September, involving a superficial fistulotomy with extension into the right glute region. Initially, the area healed slowly, but since November, the surgical site has reopened, with noticeable changes occurring in February. He experiences drainage when sitting in certain positions, resulting in bloody drainage. The drainage is clear with a touch of blood, and the area swells periodically.  The swelling and drainage occur near the anus. Swelling and drainage are exacerbated when sitting without a cushion, particularly when wearing pants with seams or belts. He describes the sensation as 'feels like his tailbone's trying to come through his skin.' The drainage is not associated with pus, and there is no report of painful or hard stools.  He has a history of diabetes, with an A1c that increased from 8.5 before the surgery to 9.5 recently. He is currently on Jardiance , Lantus, and glimepiride for diabetes management. He attributes the increase in A1c to decreased physical activity post-surgery due to fear of exacerbating his condition.  He is a [employer] and mentions that his welfare benefits are ending soon, which adds urgency to resolving his medical issues. Sitting without a cushion exacerbates his symptoms, impacting his ability to work.    Past Medical History:  Diagnosis Date   Cardiac arrest [facility])    Chest pain    Diabetes mellitus    Dysrhythmia     HLD (hyperlipidemia)    Hypertension    Obesity    Pneumonia    Sleep apnea    Syncope    Thyroid  disease    TMJ (dislocation of temporomandibular joint)     Past Surgical History:  Procedure Laterality Date   ANAL FISTULOTOMY N/A 10/27/2022   Procedure: ANAL FISTULOTOMY, EXAM UNDER ANESTHESIA;  Surgeon: Awilda Bogus, MD;  Location: [facility];  Service: General;  Laterality: N/A;   CARDIAC CATHETERIZATION     no blockage   HAND SURGERY      Family History  Problem Relation Age of Onset   Heart failure Mother    Diabetes Mother    Heart attack Father        x5    Diabetes Father     Social History   Tobacco Use   Smoking status: Former    Current packs/day: 0.00    Types: Cigarettes    Start date: 01/24/2002    Quit date: 01/25/2008    Years since quitting: 15.4   Smokeless tobacco: Never  Substance Use Topics   Alcohol use: Not Currently    Alcohol/week: 4.0 standard drinks of alcohol    Types: 4 Cans of beer per week    Comment: 3-4 beers weekly   Drug use: No    Medications: I have reviewed the patient's current medications. Allergies as of 06/20/2023       Reactions   Benzocaine Anaphylaxis, Other (See Comments)   REACTION: Blistering all over and throat swells shut   Orange Juice [orange Oil] Anaphylaxis  Flavoring Agent (non-screening)    Grapefruit Concentrate         Medication List        Accurate as of Jun 20, 2023  3:28 PM. If you have any questions, ask your nurse or doctor.          STOP taking these medications    azithromycin  250 MG tablet Commonly known as: ZITHROMAX  Stopped by: Awilda Bogus   promethazine -dextromethorphan 6.25-15 MG/5ML syrup Commonly known as: PROMETHAZINE -DM Stopped by: Awilda Bogus       TAKE these medications    amLODipine  10 MG tablet Commonly known as: NORVASC  Take 1 tablet (10 mg total) by mouth daily.   CoQ10 100 MG Caps Take by mouth.   empagliflozin  25 MG Tabs tablet Commonly  known as: Jardiance  Take 1 tablet (25 mg total) by mouth daily before breakfast.   glimepiride 4 MG tablet Commonly known as: AMARYL Take 8 mg by mouth daily.   Lantus SoloStar 100 UNIT/ML Solostar Pen Generic drug: insulin  glargine SMARTSIG:20-80 Unit(s) SUB-Q Every Night   levothyroxine  88 MCG tablet Commonly known as: SYNTHROID  TAKE 1 TABLET BY MOUTH ONCE DAILY BEFORE BREAKFAST   rosuvastatin 10 MG tablet Commonly known as: CRESTOR Take 1 tablet by mouth daily.   tadalafil  20 MG tablet Commonly known as: CIALIS  Take 1 tablet (20 mg total) by mouth as needed.   testosterone cypionate 200 MG/ML injection Commonly known as: DEPOTESTOSTERONE CYPIONATE Inject 200 mg into the muscle every 14 (fourteen) days.         ROS:  A comprehensive review of systems was negative except for: Gastrointestinal: positive for drainage from the fistula, clear, mucus/ bloody  Blood pressure (!) 151/97, pulse 79, temperature 98.5 F (36.9 C), temperature source Oral, resp. rate 18, height 5\' 11"  (1.803 m), weight 210 lb (95.3 kg), SpO2 98%. Physical Exam Physical Exam GENERAL: Alert, cooperative, well developed, no acute distress. HEENT: Normocephalic, normal oropharynx, moist mucous membranes. CHEST: Clear to auscultation bilaterally, no wheezes, rhonchi, or crackles. CARDIOVASCULAR: Normal heart rate and rhythm ABDOMEN: Soft, non-tender, non-distended, without organomegaly, normal bowel sounds. RECTAL: No obvious swelling or inflammation in perianal area, perianal area soft on palpation, no drainage from perianal area, possible area at the 1 o'clock position (right of midline) where area that is slightly irritated looking, almost like a fissure  EXTREMITIES: No cyanosis or edema. NEUROLOGICAL: Cranial nerves grossly intact, moves all extremities without gross motor or sensory deficit.  Results: None   Assessment & Plan Recurrent anal fistula with drainage Recurrent anal fistula with  periodic bloody and clear drainage, swelling, and discomfort. Differential diagnosis includes fissure, but absence of pain during bowel movements suggests otherwise. Discussed examination under anesthesia and potential referral to colorectal specialist for advanced procedures. Nan Aver may be required if significant muscle involvement. - Given his insurance changing, offered him option of referral to Colorectal now versus exam and possible fistulotomy versus seton placement. He wants to proceed with surgery. Discussed risk of bleeding, infection, needing more surgery, needing seton, muscle injury. Discussed decreased wound healing due to his vaping and DM.    Hold Jardiance  for 3 days prior to any surgical intervention.     All questions were answered to the satisfaction of the patient and family.  Awilda Bogus 06/20/2023, 3:28 PM

## 2023-06-20 NOTE — H&P (Signed)
 [facility] History and Physical  Reason for Referral: Recurrent Anal Fistula  Referring Physician: Self   Chief Complaint   Follow-up     Nathan [name] is a 48 y.o. male.  HPI:   Discussed the use of AI scribe software for clinical note transcription with the patient, who gave verbal consent to proceed.  History of Present Illness Nathan [name] is a 48 year old male with a history of fistula surgery who presents with recurrent drainage and swelling at the surgical site.  He underwent fistula surgery in September, involving a superficial fistulotomy with extension into the right glute region. Initially, the area healed slowly, but since November, the surgical site has reopened, with noticeable changes occurring in February. He experiences drainage when sitting in certain positions, resulting in bloody drainage. The drainage is clear with a touch of blood, and the area swells periodically.  The swelling and drainage occur near the anus. Swelling and drainage are exacerbated when sitting without a cushion, particularly when wearing pants with seams or belts. He describes the sensation as 'feels like his tailbone's trying to come through his skin.' The drainage is not associated with pus, and there is no report of painful or hard stools.  He has a history of diabetes, with an A1c that increased from 8.5 before the surgery to 9.5 recently. He is currently on Jardiance , Lantus, and glimepiride for diabetes management. He attributes the increase in A1c to decreased physical activity post-surgery due to fear of exacerbating his condition.  He is a [employer] and mentions that his welfare benefits are ending soon, which adds urgency to resolving his medical issues. Sitting without a cushion exacerbates his symptoms, impacting his ability to work.    Past Medical History:  Diagnosis Date   Cardiac arrest [facility])    Chest pain    Diabetes mellitus    Dysrhythmia     HLD (hyperlipidemia)    Hypertension    Obesity    Pneumonia    Sleep apnea    Syncope    Thyroid  disease    TMJ (dislocation of temporomandibular joint)     Past Surgical History:  Procedure Laterality Date   ANAL FISTULOTOMY N/A 10/27/2022   Procedure: ANAL FISTULOTOMY, EXAM UNDER ANESTHESIA;  Surgeon: Awilda Bogus, MD;  Location: [facility];  Service: General;  Laterality: N/A;   CARDIAC CATHETERIZATION     no blockage   HAND SURGERY      Family History  Problem Relation Age of Onset   Heart failure Mother    Diabetes Mother    Heart attack Father        x5    Diabetes Father     Social History   Tobacco Use   Smoking status: Former    Current packs/day: 0.00    Types: Cigarettes    Start date: 01/24/2002    Quit date: 01/25/2008    Years since quitting: 15.4   Smokeless tobacco: Never  Substance Use Topics   Alcohol use: Not Currently    Alcohol/week: 4.0 standard drinks of alcohol    Types: 4 Cans of beer per week    Comment: 3-4 beers weekly   Drug use: No    Medications: I have reviewed the patient's current medications. Allergies as of 06/20/2023       Reactions   Benzocaine Anaphylaxis, Other (See Comments)   REACTION: Blistering all over and throat swells shut   Orange Juice [orange Oil] Anaphylaxis  Flavoring Agent (non-screening)    Grapefruit Concentrate         Medication List        Accurate as of Jun 20, 2023  3:28 PM. If you have any questions, ask your nurse or doctor.          STOP taking these medications    azithromycin  250 MG tablet Commonly known as: ZITHROMAX  Stopped by: Awilda Bogus   promethazine -dextromethorphan 6.25-15 MG/5ML syrup Commonly known as: PROMETHAZINE -DM Stopped by: Awilda Bogus       TAKE these medications    amLODipine  10 MG tablet Commonly known as: NORVASC  Take 1 tablet (10 mg total) by mouth daily.   CoQ10 100 MG Caps Take by mouth.   empagliflozin  25 MG Tabs tablet Commonly  known as: Jardiance  Take 1 tablet (25 mg total) by mouth daily before breakfast.   glimepiride 4 MG tablet Commonly known as: AMARYL Take 8 mg by mouth daily.   Lantus SoloStar 100 UNIT/ML Solostar Pen Generic drug: insulin  glargine SMARTSIG:20-80 Unit(s) SUB-Q Every Night   levothyroxine  88 MCG tablet Commonly known as: SYNTHROID  TAKE 1 TABLET BY MOUTH ONCE DAILY BEFORE BREAKFAST   rosuvastatin 10 MG tablet Commonly known as: CRESTOR Take 1 tablet by mouth daily.   tadalafil  20 MG tablet Commonly known as: CIALIS  Take 1 tablet (20 mg total) by mouth as needed.   testosterone cypionate 200 MG/ML injection Commonly known as: DEPOTESTOSTERONE CYPIONATE Inject 200 mg into the muscle every 14 (fourteen) days.         ROS:  A comprehensive review of systems was negative except for: Gastrointestinal: positive for drainage from the fistula, clear, mucus/ bloody  Blood pressure (!) 151/97, pulse 79, temperature 98.5 F (36.9 C), temperature source Oral, resp. rate 18, height 5\' 11"  (1.803 m), weight 210 lb (95.3 kg), SpO2 98%. Physical Exam Physical Exam GENERAL: Alert, cooperative, well developed, no acute distress. HEENT: Normocephalic, normal oropharynx, moist mucous membranes. CHEST: Clear to auscultation bilaterally, no wheezes, rhonchi, or crackles. CARDIOVASCULAR: Normal heart rate and rhythm ABDOMEN: Soft, non-tender, non-distended, without organomegaly, normal bowel sounds. RECTAL: No obvious swelling or inflammation in perianal area, perianal area soft on palpation, no drainage from perianal area, possible area at the 1 o'clock position (right of midline) where area that is slightly irritated looking, almost like a fissure  EXTREMITIES: No cyanosis or edema. NEUROLOGICAL: Cranial nerves grossly intact, moves all extremities without gross motor or sensory deficit.  Results: None   Assessment & Plan Recurrent anal fistula with drainage Recurrent anal fistula with  periodic bloody and clear drainage, swelling, and discomfort. Differential diagnosis includes fissure, but absence of pain during bowel movements suggests otherwise. Discussed examination under anesthesia and potential referral to colorectal specialist for advanced procedures. Nan Aver may be required if significant muscle involvement. - Given his insurance changing, offered him option of referral to Colorectal now versus exam and possible fistulotomy versus seton placement. He wants to proceed with surgery. Discussed risk of bleeding, infection, needing more surgery, needing seton, muscle injury. Discussed decreased wound healing due to his vaping and DM.    Hold Jardiance  for 3 days prior to any surgical intervention.     All questions were answered to the satisfaction of the patient and family.  Awilda Bogus 06/20/2023, 3:28 PM

## 2023-06-20 NOTE — Patient Instructions (Signed)
 Nathan [name]  06/20/2023     @PREFPERIOPPHARMACY @   Your procedure is scheduled on  06/22/2023.   Report to [facility] at  [phone] A.M.   Call this number if you have problems the morning of surgery:  [phone]  If you experience any cold or flu symptoms such as cough, fever, chills, shortness of breath, etc. between now and your scheduled surgery, please notify us  at the above number.   Remember:         Your last dose of jardiance  should be on 06/18/2023.        Take 1/2 of your usual insulin  dosage the night before your procedure.       DO NOT take any medications for diabetes the morning of yor procedure.   Do not eat after midnight.   You may drink clear liquids until 0645 am on 06/22/2023.    Clear liquids allowed are:                    Water, Juice (No red color; non-citric and without pulp; diabetics please choose diet or no sugar options), Carbonated beverages (diabetics please choose diet or no sugar options), Clear Tea (No creamer, milk, or cream, including half & half and powdered creamer), Black Coffee Only (No creamer, milk or cream, including half & half and powdered creamer), and Clear Sports drink (No red color; diabetics please choose diet or no sugar options)    Take these medicines the morning of surgery with A SIP OF WATER                                      amlodipine , levothyroxine .    Do not wear jewelry, make-up or nail polish, including gel polish,  artificial nails, or any other type of covering on natural nails (fingers and  toes).  Do not wear lotions, powders, or perfumes, or deodorant.  Do not shave 48 hours prior to surgery.  Men may shave face and neck.  Do not bring valuables to the hospital.  [facility] is not responsible for any belongings or valuables.  Contacts, dentures or bridgework may not be worn into surgery.  Leave your suitcase in the car.  After surgery it may be brought to your room.  For patients admitted to  the hospital, discharge time will be determined by your treatment team.  Patients discharged the day of surgery will not be allowed to drive home and must have someone with them for 24 hours.    Special instructions:  DO NOT smoke tobacco or vape for 24 hours before your procedure.  Please read over the following fact sheets that you were given. Coughing and Deep Breathing, Surgical Site Infection Prevention, Anesthesia Post-op Instructions, and Care and Recovery After Surgery        Anal Fistulotomy, Care After After an anal fistulotomy, it is common to have: Some pain, discomfort, and swelling. More pain when you poop. Some bleeding from the incision. Some leakage of poop (stool). Follow these instructions at home: Medicines Take over-the-counter and prescription medicines only as told by your health care provider. If you were prescribed antibiotics, take them as told by your provider. Do not stop using the antibiotic even if you start to feel better. Ask your provider if the medicine prescribed to you requires you to avoid driving or using machinery. Incision  care  Follow instructions from your provider about how to take care of your incision. Make sure you: Wash your hands with soap and water for at least 20 seconds before and after you remove your bandage (dressing). If soap and water are not available, use hand sanitizer. Remove your dressing as told by your provider. In some cases, you may be told not to remove your dressing. You may need to let it come out with your first poop after surgery. Keep the incision area clean and dry. Check your incision area every day for signs of infection. Check for: More redness, swelling, or pain. More fluid or blood. Warmth. Pus or a bad smell. Self-care  After you poop, clean the incision area. Use one of these methods: Gently wipe with a moist towelette. Gently wipe with mild soap and water. Take a shower. Take a sitz bath. This is  a shallow, warm-water bath that attaches to the toilet bowl. You can also sit in a bathtub filled with warm water. If told, put ice on the incision area. Put ice in a plastic bag. Place a towel between your skin and the bag. Leave the ice on for 20 minutes, 2-3 times a day. If your skin turns bright red, remove the ice right away to prevent skin damage. The risk of damage is higher if you cannot feel pain, heat, or cold. Managing constipation To prevent or treat constipation, you may need to: Drink enough fluid to keep your pee (urine) pale yellow. Take over-the-counter or prescription medicines. Eat foods that are high in fiber, such as beans, whole grains, and fresh fruits and vegetables. Limit foods that are high in fat and processed sugars, such as fried or sweet foods. Activity If you were given a sedative during the procedure, it can affect you for several hours. Do not drive or operate machinery until your provider says that it is safe. Rest as told by your provider. Do not sit for a long time without moving. Get up to take short walks every 1-2 hours. This will improve blood flow and breathing. Ask for help if you feel weak or unsteady. You may have to avoid lifting. Ask your provider how much you can safely lift. Return to your normal activities as told by your provider. Ask your provider what activities are safe for you. General instructions Follow instructions from your provider about what you may eat and drink. Do not use any products that contain nicotine or tobacco. These products include cigarettes, chewing tobacco, and vaping devices, such as e-cigarettes. If you need help quitting, ask your provider. If you have bleeding from the incision, wear a pad to absorb blood. Change it often. Do not swim or use a hot tub until your provider approves. Your provider may give you more instructions. Make sure you know what you can and cannot do. Contact a health care provider if: You  have any signs of infection. You have a fever or chills. You have swelling or tenderness near your groin. You cannot control when you pee (urinate) or poop (incontinence). You are leaking poop. You have trouble peeing. Your pain does not get better with medicine. Get help right away if: You have severe pain in your abdomen or near your incision. You have sudden chest pain. You become weak or you faint. You have bleeding from your incision that soaks 2 or more pads in 24 hours. These symptoms may be an emergency. Get help right away. Call 911. Do not wait  to see if the symptoms will go away. Do not drive yourself to the hospital. This information is not intended to replace advice given to you by your health care provider. Make sure you discuss any questions you have with your health care provider. Document Revised: 11/16/2021 Document Reviewed: 11/16/2021 Elsevier Patient Education  2024 [employer].General Anesthesia, Adult, Care After The following information offers guidance on how to care for yourself after your procedure. Your health care provider may also give you more specific instructions. If you have problems or questions, contact your health care provider. What can I expect after the procedure? After the procedure, it is common for people to: Have pain or discomfort at the IV site. Have nausea or vomiting. Have a sore throat or hoarseness. Have trouble concentrating. Feel cold or chills. Feel weak, sleepy, or tired (fatigue). Have soreness and body aches. These can affect parts of the body that were not involved in surgery. Follow these instructions at home: For the time period you were told by your health care provider:  Rest. Do not participate in activities where you could fall or become injured. Do not drive or use machinery. Do not drink alcohol. Do not take sleeping pills or medicines that cause drowsiness. Do not make important decisions or sign legal  documents. Do not take care of children on your own. General instructions Drink enough fluid to keep your urine pale yellow. If you have sleep apnea, surgery and certain medicines can increase your risk for breathing problems. Follow instructions from your health care provider about wearing your sleep device: Anytime you are sleeping, including during daytime naps. While taking prescription pain medicines, sleeping medicines, or medicines that make you drowsy. Return to your normal activities as told by your health care provider. Ask your health care provider what activities are safe for you. Take over-the-counter and prescription medicines only as told by your health care provider. Do not use any products that contain nicotine or tobacco. These products include cigarettes, chewing tobacco, and vaping devices, such as e-cigarettes. These can delay incision healing after surgery. If you need help quitting, ask your health care provider. Contact a health care provider if: You have nausea or vomiting that does not get better with medicine. You vomit every time you eat or drink. You have pain that does not get better with medicine. You cannot urinate or have bloody urine. You develop a skin rash. You have a fever. Get help right away if: You have trouble breathing. You have chest pain. You vomit blood. These symptoms may be an emergency. Get help right away. Call 911. Do not wait to see if the symptoms will go away. Do not drive yourself to the hospital. Summary After the procedure, it is common to have a sore throat, hoarseness, nausea, vomiting, or to feel weak, sleepy, or fatigue. For the time period you were told by your health care provider, do not drive or use machinery. Get help right away if you have difficulty breathing, have chest pain, or vomit blood. These symptoms may be an emergency. This information is not intended to replace advice given to you by your health care provider.  Make sure you discuss any questions you have with your health care provider. Document Revised: 04/09/2021 Document Reviewed: 04/09/2021 Elsevier Patient Education  2024 Elsevier Inc.How to Use Chlorhexidine  at Home in the Shower Chlorhexidine  gluconate (CHG) is a germ-killing (antiseptic) wash that's used to clean the skin. It can get rid of the germs that normally  live on the skin and can keep them away for about 24 hours. If you're having surgery, you may be told to shower with CHG at home the night before surgery. This can help lower your risk for infection. To use CHG wash in the shower, follow the steps below. Supplies needed: CHG body wash. Clean washcloth. Clean towel. How to use CHG in the shower Follow these steps unless you're told to use CHG in a different way: Start the shower. Use your normal soap and shampoo to wash your face and hair. Turn off the shower or move out of the shower stream. Pour CHG onto a clean washcloth. Do not use any type of brush or rough sponge. Start at your neck, washing your body down to your toes. Make sure you: Wash the part of your body where the surgery will be done for at least 1 minute. Do not scrub. Do not use CHG on your head or face unless your health care provider tells you to. If it gets into your ears or eyes, rinse them well with water. Do not wash your genitals with CHG. Wash your back and under your arms. Make sure to wash skin folds. Let the CHG sit on your skin for 1-2 minutes or as long as told. Rinse your entire body in the shower, including all body creases and folds. Turn off the shower. Dry off with a clean towel. Do not put anything on your skin afterward, such as powder, lotion, or perfume. Put on clean clothes or pajamas. If it's the night before surgery, sleep in clean sheets. General tips Use CHG only as told, and follow the instructions on the label. Use the full amount of CHG as told. This is often one bottle. Do not  smoke and stay away from flames after using CHG. Your skin may feel sticky after using CHG. This is normal. The sticky feeling will go away as the CHG dries. Do not use CHG: If you have a chlorhexidine  allergy or have reacted to chlorhexidine  in the past. On open wounds or areas of skin that have broken skin, cuts, or scrapes. On babies younger than 66 months of age. Contact a health care provider if: You have questions about using CHG. Your skin gets irritated or itchy. You have a rash after using CHG. You swallow any CHG. Call your local poison control center [phone] in the [place] Your eyes itch badly, or they become very red or swollen. Your hearing changes. You have trouble seeing. If you can't reach your provider, go to an urgent care or emergency room. Do not drive yourself. Get help right away if: You have swelling or tingling in your mouth or throat. You make high-pitched whistling sounds when you breathe, most often when you breathe out (wheeze). You have trouble breathing. These symptoms may be an emergency. Call 911 right away. Do not wait to see if the symptoms will go away. Do not drive yourself to the hospital. This information is not intended to replace advice given to you by your health care provider. Make sure you discuss any questions you have with your health care provider. Document Revised: 07/26/2022 Document Reviewed: 07/22/2021 Elsevier Patient Education  2024 [employer].

## 2023-06-20 NOTE — Patient Instructions (Signed)
 Do not take your Jardaince.   Anal Fistulotomy  Anal fistulotomy is a surgery to open and drain an anal fistula. An anal fistula is a tunnel that forms between the bowel and the skin near the opening of the butt (anus).  Opening and draining the fistula can help the area heal. Tell a health care provider about: Any allergies you have. All medicines you are taking, including vitamins, herbs, eye drops, creams, and over-the-counter medicines. Any problems you or family members have had with anesthesia. Any bleeding problems you have. Any surgeries you have had. Any medical conditions you have. Whether you are pregnant or may be pregnant. Any problems you have controlling when you pee (urinate) or poop (incontinence). What are the risks? Your health care provider will talk with you about risks. These may include: Infection. Bleeding. Allergic reactions to medicines or dyes. Damage to nearby structures or organs. Incontinence or leaking of poop (stool). Being unable to empty your bladder (urinary retention). Needing more surgery. You may need this if the fistula comes back. What happens before the surgery? Medicines Ask your provider about: Changing or stopping your regular medicines. These include any diabetes medicines or blood thinners you take. Taking medicines such as aspirin  and ibuprofen. These medicines can thin your blood. Do not take them unless your provider tells you to. Taking over-the-counter medicines, vitamins, herbs, and supplements. You may be told to take a medicine that helps you poop (laxative). You may also be told to use an enema to clean your bowels. Tests You may have an exam or testing. You may have a blood or pee (urine) sample taken. Surgery safety Ask your provider what steps will be taken to help prevent infection. These may include: Removing hair at the surgery site. Washing skin with a soap that kills germs. Taking antibiotics. General  instructions Do not use any products that contain nicotine or tobacco for at least 4 weeks before the surgery. These products include cigarettes, chewing tobacco, and vaping devices, such as e-cigarettes. If you need help quitting, ask your provider. If you will be going home right after the surgery, plan to have a responsible adult: Take you home from the hospital or clinic. You will not be allowed to drive. Care for you for the time you are told. What happens during the surgery? An IV will be inserted into one of your veins. You will be given: A sedative. This helps you relax. Anesthesia. This keeps you from feeling pain. It will make you fall asleep for surgery. The opening of the fistula will be found inside your body. An incision will be made. It may extend into the muscles around the anus (sphincter muscles). The fistula will be cut open and drained. Gauze bandages (dressings) may be put inside the fistula. The fistula will be left open. This helps it heal as flat scar tissue. The surgery may vary among providers and hospitals. What happens after the surgery? Your blood pressure, heart rate, breathing rate, and blood oxygen level will be monitored until you leave the hospital or clinic. You may have some bleeding. You may have to wear a pad to absorb blood. This information is not intended to replace advice given to you by your health care provider. Make sure you discuss any questions you have with your health care provider. Document Revised: 11/16/2021 Document Reviewed: 11/16/2021 Elsevier Patient Education  2024 [employer].

## 2023-06-21 ENCOUNTER — Encounter (HOSPITAL_COMMUNITY): Payer: Self-pay

## 2023-06-21 ENCOUNTER — Encounter (HOSPITAL_COMMUNITY)
Admission: RE | Admit: 2023-06-21 | Discharge: 2023-06-21 | Disposition: A | Discharge status: Home / Self Care | Source: Ambulatory Visit | Attending: General Surgery | Admitting: General Surgery

## 2023-06-21 ENCOUNTER — Other Ambulatory Visit: Payer: Self-pay

## 2023-06-21 VITALS — BP 138/99 | HR 79 | Temp 98.5°F | Resp 18 | Ht 71.0 in | Wt 210.0 lb

## 2023-06-21 DIAGNOSIS — Z01818 Encounter for other preprocedural examination: Secondary | ICD-10-CM | POA: Diagnosis present

## 2023-06-21 DIAGNOSIS — E1159 Type 2 diabetes mellitus with other circulatory complications: Secondary | ICD-10-CM | POA: Diagnosis not present

## 2023-06-21 DIAGNOSIS — Z01812 Encounter for preprocedural laboratory examination: Secondary | ICD-10-CM | POA: Diagnosis not present

## 2023-06-21 LAB — BASIC METABOLIC PANEL WITH GFR
Anion gap: 11 (ref 5–15)
BUN: 9 mg/dL (ref 6–20)
CO2: 25 mmol/L (ref 22–32)
Calcium: 9.2 mg/dL (ref 8.9–10.3)
Chloride: 96 mmol/L — ABNORMAL LOW (ref 98–111)
Creatinine, Ser: 0.73 mg/dL (ref 0.61–1.24)
GFR, Estimated: >60 mL/min (ref >60)
Glucose, Bld: 368 mg/dL — ABNORMAL HIGH (ref 70–99)
Potassium: 3.8 mmol/L (ref 3.5–5.1)
Sodium: 132 mmol/L — ABNORMAL LOW (ref 135–145)

## 2023-06-21 LAB — HEMOGLOBIN A1C
Hgb A1c MFr Bld: 10.1 % — ABNORMAL HIGH (ref 4.8–5.6)
Mean Plasma Glucose: 243.17 mg/dL

## 2023-06-22 ENCOUNTER — Encounter: Payer: Self-pay | Admitting: *Deleted

## 2023-06-22 ENCOUNTER — Encounter (HOSPITAL_COMMUNITY)
Admission: RE | Disposition: A | Discharge status: Home / Self Care | Payer: Self-pay | Source: Home / Self Care | Attending: General Surgery

## 2023-06-22 ENCOUNTER — Emergency Department (HOSPITAL_COMMUNITY)
Admission: EM | Admit: 2023-06-22 | Discharge: 2023-06-22 | Disposition: A | Discharge status: Home / Self Care | Attending: Emergency Medicine | Admitting: Emergency Medicine

## 2023-06-22 ENCOUNTER — Ambulatory Visit (HOSPITAL_COMMUNITY): Payer: Self-pay | Admitting: Certified Registered"

## 2023-06-22 ENCOUNTER — Ambulatory Visit (HOSPITAL_COMMUNITY)
Admission: RE | Admit: 2023-06-22 | Discharge: 2023-06-22 | Disposition: A | Discharge status: Home / Self Care | Attending: General Surgery | Admitting: General Surgery

## 2023-06-22 ENCOUNTER — Encounter (HOSPITAL_COMMUNITY): Payer: Self-pay

## 2023-06-22 ENCOUNTER — Other Ambulatory Visit: Payer: Self-pay

## 2023-06-22 DIAGNOSIS — E119 Type 2 diabetes mellitus without complications: Secondary | ICD-10-CM | POA: Insufficient documentation

## 2023-06-22 DIAGNOSIS — K603 Anal fistula, unspecified: Secondary | ICD-10-CM

## 2023-06-22 DIAGNOSIS — Z794 Long term (current) use of insulin: Secondary | ICD-10-CM | POA: Insufficient documentation

## 2023-06-22 DIAGNOSIS — Z87891 Personal history of nicotine dependence: Secondary | ICD-10-CM | POA: Diagnosis not present

## 2023-06-22 DIAGNOSIS — Z7984 Long term (current) use of oral hypoglycemic drugs: Secondary | ICD-10-CM | POA: Insufficient documentation

## 2023-06-22 DIAGNOSIS — I10 Essential (primary) hypertension: Secondary | ICD-10-CM | POA: Insufficient documentation

## 2023-06-22 DIAGNOSIS — E1165 Type 2 diabetes mellitus with hyperglycemia: Secondary | ICD-10-CM | POA: Insufficient documentation

## 2023-06-22 DIAGNOSIS — Z79899 Other long term (current) drug therapy: Secondary | ICD-10-CM | POA: Diagnosis not present

## 2023-06-22 DIAGNOSIS — G4730 Sleep apnea, unspecified: Secondary | ICD-10-CM | POA: Insufficient documentation

## 2023-06-22 DIAGNOSIS — G8918 Other acute postprocedural pain: Secondary | ICD-10-CM | POA: Insufficient documentation

## 2023-06-22 DIAGNOSIS — R58 Hemorrhage, not elsewhere classified: Secondary | ICD-10-CM

## 2023-06-22 DIAGNOSIS — E039 Hypothyroidism, unspecified: Secondary | ICD-10-CM | POA: Diagnosis not present

## 2023-06-22 DIAGNOSIS — E1159 Type 2 diabetes mellitus with other circulatory complications: Secondary | ICD-10-CM

## 2023-06-22 HISTORY — PX: ANAL FISTULOTOMY: SHX6423

## 2023-06-22 LAB — GLUCOSE, CAPILLARY
Glucose-Capillary: 326 mg/dL — ABNORMAL HIGH (ref 70–99)
Glucose-Capillary: 275 mg/dL — ABNORMAL HIGH (ref 70–99)
Glucose-Capillary: 297 mg/dL — ABNORMAL HIGH (ref 70–99)
Glucose-Capillary: 226 mg/dL — ABNORMAL HIGH (ref 70–99)

## 2023-06-22 SURGERY — ANAL FISTULOTOMY
Anesthesia: General | Site: Rectum

## 2023-06-22 MED ORDER — METHYLENE BLUE (ANTIDOTE) 1 % IV SOLN
INTRAVENOUS | Status: AC
  Filled 2023-06-22: qty 10

## 2023-06-22 MED ORDER — FENTANYL CITRATE (PF) 100 MCG/2ML IJ SOLN
INTRAMUSCULAR | Status: AC
  Filled 2023-06-22: qty 2

## 2023-06-22 MED ORDER — MIDAZOLAM HCL 2 MG/2ML IJ SOLN
INTRAMUSCULAR | Status: AC
  Filled 2023-06-22: qty 2

## 2023-06-22 MED ORDER — SODIUM CHLORIDE 0.9 % IV SOLN
INTRAVENOUS | Status: AC
  Filled 2023-06-22: qty 2

## 2023-06-22 MED ORDER — SODIUM CHLORIDE 0.9 % IR SOLN
Status: DC | PRN
  Administered 2023-06-22: 1000 mL

## 2023-06-22 MED ORDER — CHLORHEXIDINE GLUCONATE 0.12 % MT SOLN: 15.0000 mL | Freq: Once | OROMUCOSAL | Status: DC

## 2023-06-22 MED ORDER — CHLORHEXIDINE GLUCONATE CLOTH 2 % EX PADS: 6.0000 | MEDICATED_PAD | Freq: Once | CUTANEOUS | Status: DC

## 2023-06-22 MED ORDER — FENTANYL CITRATE (PF) 100 MCG/2ML IJ SOLN
INTRAMUSCULAR | Status: DC | PRN
  Administered 2023-06-22 (×2): 50 ug via INTRAVENOUS

## 2023-06-22 MED ORDER — LACTATED RINGERS IV SOLN: INTRAVENOUS | Status: DC | PRN

## 2023-06-22 MED ORDER — METHYLENE BLUE (ANTIDOTE) 1 % IV SOLN
INTRAVENOUS | Status: DC | PRN
  Administered 2023-06-22 (×2): 1 mL

## 2023-06-22 MED ORDER — PROPOFOL 10 MG/ML IV BOLUS
INTRAVENOUS | Status: DC | PRN
Start: 2023-06-22 — End: 2023-06-22
  Administered 2023-06-22: 50 mg via INTRAVENOUS
  Administered 2023-06-22: 200 mg via INTRAVENOUS

## 2023-06-22 MED ORDER — LIDOCAINE 2% (20 MG/ML) 5 ML SYRINGE
INTRAMUSCULAR | Status: AC
  Filled 2023-06-22: qty 5

## 2023-06-22 MED ORDER — LIDOCAINE VISCOUS HCL 2 % MT SOLN
OROMUCOSAL | Status: AC
Start: 2023-06-22 — End: ?
  Filled 2023-06-22: qty 15

## 2023-06-22 MED ORDER — FENTANYL CITRATE PF 50 MCG/ML IJ SOSY: 25.0000 ug | PREFILLED_SYRINGE | INTRAMUSCULAR | Status: DC | PRN

## 2023-06-22 MED ORDER — OXYCODONE HCL 5 MG PO TABS: 5.0000 mg | ORAL_TABLET | Freq: Once | ORAL | Status: DC | PRN

## 2023-06-22 MED ORDER — ONDANSETRON HCL 4 MG/2ML IJ SOLN: 4.0000 mg | Freq: Once | INTRAMUSCULAR | Status: DC | PRN

## 2023-06-22 MED ORDER — OXYCODONE HCL 5 MG PO TABS: 5.0000 mg | ORAL_TABLET | ORAL | 0 refills | Status: DC | PRN

## 2023-06-22 MED ORDER — INSULIN ASPART 100 UNIT/ML IJ SOLN
20.0000 [IU] | Freq: Once | INTRAMUSCULAR | Status: AC
  Administered 2023-06-22: 20 [IU] via SUBCUTANEOUS
  Filled 2023-06-22: qty 0.2

## 2023-06-22 MED ORDER — PROPOFOL 10 MG/ML IV BOLUS
INTRAVENOUS | Status: AC
  Filled 2023-06-22: qty 20

## 2023-06-22 MED ORDER — SODIUM CHLORIDE 0.9 % IV SOLN
2.0000 g | INTRAVENOUS | Status: AC
  Administered 2023-06-22: 2 g via INTRAVENOUS

## 2023-06-22 MED ORDER — DEXAMETHASONE SODIUM PHOSPHATE 10 MG/ML IJ SOLN
INTRAMUSCULAR | Status: AC
  Filled 2023-06-22: qty 1

## 2023-06-22 MED ORDER — BUPIVACAINE HCL (PF) 0.5 % IJ SOLN
INTRAMUSCULAR | Status: AC
  Filled 2023-06-22: qty 30

## 2023-06-22 MED ORDER — LACTATED RINGERS IV SOLN: INTRAVENOUS | Status: DC

## 2023-06-22 MED ORDER — EPHEDRINE SULFATE-NACL 50-0.9 MG/10ML-% IV SOSY
PREFILLED_SYRINGE | INTRAVENOUS | Status: DC | PRN
Start: 2023-06-22 — End: 2023-06-22
  Administered 2023-06-22: 5 mg via INTRAVENOUS

## 2023-06-22 MED ORDER — LIDOCAINE 2% (20 MG/ML) 5 ML SYRINGE
INTRAMUSCULAR | Status: DC | PRN
  Administered 2023-06-22: 80 mg via INTRAVENOUS

## 2023-06-22 MED ORDER — ORAL CARE MOUTH RINSE: 15.0000 mL | Freq: Once | OROMUCOSAL | Status: DC

## 2023-06-22 MED ORDER — HYDROGEN PEROXIDE 3 % EX SOLN
CUTANEOUS | Status: DC | PRN
Start: 2023-06-22 — End: 2023-06-22
  Administered 2023-06-22: 1

## 2023-06-22 MED ORDER — DOCUSATE SODIUM 100 MG PO CAPS: 100.0000 mg | ORAL_CAPSULE | Freq: Two times a day (BID) | ORAL | 2 refills | Status: DC

## 2023-06-22 MED ORDER — ONDANSETRON HCL 4 MG/2ML IJ SOLN
INTRAMUSCULAR | Status: AC
  Filled 2023-06-22: qty 2

## 2023-06-22 MED ORDER — CHLORHEXIDINE GLUCONATE 0.12 % MT SOLN
OROMUCOSAL | Status: AC
  Filled 2023-06-22: qty 15

## 2023-06-22 MED ORDER — ONDANSETRON HCL 4 MG/2ML IJ SOLN
INTRAMUSCULAR | Status: DC | PRN
Start: 2023-06-22 — End: 2023-06-22
  Administered 2023-06-22: 4 mg via INTRAVENOUS

## 2023-06-22 MED ORDER — DEXMEDETOMIDINE HCL IN NACL 80 MCG/20ML IV SOLN
INTRAVENOUS | Status: DC | PRN
  Administered 2023-06-22: 20 ug via INTRAVENOUS

## 2023-06-22 MED ORDER — OXYCODONE HCL 5 MG/5ML PO SOLN: 5.0000 mg | Freq: Once | ORAL | Status: DC | PRN

## 2023-06-22 MED ORDER — MIDAZOLAM HCL 2 MG/2ML IJ SOLN
INTRAMUSCULAR | Status: DC | PRN
Start: 2023-06-22 — End: 2023-06-22
  Administered 2023-06-22: 2 mg via INTRAVENOUS

## 2023-06-22 SURGICAL SUPPLY — 26 items
BAG HAMPER (MISCELLANEOUS) ×1 IMPLANT
BNDG GAUZE DERMACEA FLUFF 4 (GAUZE/BANDAGES/DRESSINGS) IMPLANT
CANNULA VESSEL 3MM 2 BLNT TIP (CANNULA) ×1 IMPLANT
CLOTH BEACON ORANGE TIMEOUT ST (SAFETY) ×1 IMPLANT
COVER LIGHT HANDLE STERIS (MISCELLANEOUS) ×2 IMPLANT
ELECTRODE REM PT RTRN 9FT ADLT (ELECTROSURGICAL) ×1 IMPLANT
GAUZE SPONGE 4X4 12PLY STRL (GAUZE/BANDAGES/DRESSINGS) ×1 IMPLANT
GLOVE BIO SURGEON STRL SZ 6.5 (GLOVE) ×1 IMPLANT
GLOVE BIOGEL M 7.0 STRL (GLOVE) ×1 IMPLANT
GLOVE BIOGEL PI IND STRL 6.5 (GLOVE) ×1 IMPLANT
GLOVE BIOGEL PI IND STRL 7.0 (GLOVE) ×2 IMPLANT
GOWN STRL REUS W/TWL LRG LVL3 (GOWN DISPOSABLE) ×2 IMPLANT
KIT TURNOVER CYSTO (KITS) ×1 IMPLANT
MANIFOLD NEPTUNE II (INSTRUMENTS) ×1 IMPLANT
NDL HYPO 21X1.5 SAFETY (NEEDLE) ×1 IMPLANT
NEEDLE HYPO 21X1.5 SAFETY (NEEDLE) ×1 IMPLANT
NS IRRIG 1000ML POUR BTL (IV SOLUTION) ×1 IMPLANT
NS IRRIG 500ML POUR BTL (IV SOLUTION) IMPLANT
PACK PERI GYN (CUSTOM PROCEDURE TRAY) ×1 IMPLANT
PAD ARMBOARD POSITIONER FOAM (MISCELLANEOUS) ×1 IMPLANT
POSITIONER HEAD 8X9X4 ADT (SOFTGOODS) ×1 IMPLANT
SET BASIN LINEN APH (SET/KITS/TRAYS/PACK) ×1 IMPLANT
SHEET LAVH (DRAPES) IMPLANT
SURGILUBE 2OZ TUBE FLIPTOP (MISCELLANEOUS) ×1 IMPLANT
SUT SILK 0 FSL (SUTURE) ×1 IMPLANT
SYR 30ML LL (SYRINGE) ×3 IMPLANT

## 2023-06-22 NOTE — ED Notes (Signed)
 When applying dressing no visible active bleeding from packing. Reinforced dressing.

## 2023-06-22 NOTE — Discharge Instructions (Signed)
 Continue packing changes as previously recommended.  Follow-up with your surgeon in the next few days, and return to the ER if symptoms worsen or change.

## 2023-06-22 NOTE — Progress Notes (Signed)
 Dr. Margrette Shield Notified of bld glucose 226, earlier rec 20 units at 0919  no new orders

## 2023-06-22 NOTE — ED Provider Notes (Signed)
 [place] EMERGENCY DEPARTMENT AT [facility] Provider Note   CSN: [id number] Arrival date & time: 06/22/23  2141     History  Chief Complaint  Patient presents with   Post-op Problem    Bleeding wound    Nathan [name] is a 48 y.o. male.  Patient is a 48 year old male with past medical history of diabetes, hypertension, hyperlipidemia.  Patient presenting today with complaints of bleeding.  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 Collene Dawson.  Wife was instructed to perform packing changes at home.  This evening she went to change the packing and noted significant bleeding from the area.  This was difficult to get stopped and patient was brought here for evaluation.  At the time of my evaluation, there is no bleeding.       Home Medications Prior to Admission medications   Medication Sig Start Date End Date Taking? Authorizing Provider  amLODipine  (NORVASC ) 10 MG tablet Take 1 tablet (10 mg total) by mouth daily. 02/28/17   Nida, Gebreselassie W, MD  Coenzyme Q10 (COQ10) 100 MG CAPS Take by mouth.    [provider]  docusate sodium (COLACE) 100 MG capsule Take 1 capsule (100 mg total) by mouth 2 (two) times daily. 06/22/23 06/21/24  Awilda Bogus, MD  empagliflozin  (JARDIANCE ) 25 MG TABS tablet Take 1 tablet (25 mg total) by mouth daily before breakfast. 02/06/20   Gwyndolyn Lerner, MD  glimepiride (AMARYL) 4 MG tablet Take 8 mg by mouth daily. 07/31/22   [provider]  LANTUS SOLOSTAR 100 UNIT/ML Solostar Pen SMARTSIG:20-80 Unit(s) SUB-Q Every Night 09/30/22   [provider]  levothyroxine  (SYNTHROID ) 88 MCG tablet TAKE 1 TABLET BY MOUTH ONCE DAILY BEFORE BREAKFAST 08/16/21   Lajean Pike, MD  oxyCODONE  (ROXICODONE ) 5 MG immediate release tablet Take 1 tablet (5 mg total) by mouth every 4 (four) hours as needed for severe pain (pain score 7-10) or breakthrough pain. 06/22/23 06/21/24  Awilda Bogus, MD  rosuvastatin  (CRESTOR) 10 MG tablet Take 1 tablet by mouth daily.    [provider]  tadalafil  (CIALIS ) 20 MG tablet Take 1 tablet (20 mg total) by mouth as needed. 04/28/23   McKenzie, Arden Beck, MD  testosterone cypionate (DEPOTESTOSTERONE CYPIONATE) 200 MG/ML injection Inject 200 mg into the muscle every 14 (fourteen) days. 10/02/22   [provider]      Allergies    Benzocaine, Lidocaine , Orange juice [orange oil], Flavoring agent (non-screening), and Grapefruit concentrate    Review of Systems   Review of Systems  All other systems reviewed and are negative.   Physical Exam Updated Vital Signs BP (!) 150/93   Pulse 99   Temp 97.9 F (36.6 C) (Oral)   Resp 20   Ht 5\' 11"  (1.803 m)   Wt 95.3 kg   SpO2 96%   BMI 29.29 kg/m  Physical Exam Vitals and nursing note reviewed.  Constitutional:      Appearance: Normal appearance.  Pulmonary:     Effort: Pulmonary effort is normal.  Genitourinary:    Comments: The site of the incision appears well.  The packing is in place with no bleeding.  There is no surrounding erythema or purulent drainage. Skin:    General: Skin is warm and dry.  Neurological:     Mental Status: He is alert and oriented to person, place, and time.     ED Results / Procedures / Treatments   Labs (  all labs ordered are listed, but only abnormal results are displayed) Labs Reviewed - No data to display  EKG None  Radiology No results found.  Procedures Procedures    Medications Ordered in ED Medications - No data to display  ED Course/ Medical Decision Making/ A&P  Patient presenting with bleeding from the site of his anal fistula incision which has resolved prior to arrival.  I have evaluated the packing and wound and all appears well.  There is no active bleeding and I believe patient can safely be discharged.  To follow-up as needed.  Final Clinical Impression(s) / ED Diagnoses Final diagnoses:  None    Rx / DC Orders ED Discharge  Orders     None         Orvilla Blander, MD 06/22/23 2334

## 2023-06-22 NOTE — Transfer of Care (Signed)
 Immediate Anesthesia Transfer of Care Note  Patient: Nathan [name]  Procedure(s) Performed: SUPERFICIAL POSTERIOR PERIANAL FISTULECTOMY (Rectum)  Patient Location: PACU  Anesthesia Type:General  Level of Consciousness: awake, drowsy, patient cooperative, and responds to stimulation  Airway & Oxygen Therapy: Patient Spontanous Breathing and Patient connected to face mask oxygen  Post-op Assessment: Report given to RN and Post -op Vital signs reviewed and stable  Post vital signs: Reviewed and stable  Last Vitals:  Vitals Value Taken Time  BP 104/69 06/22/23 1215  Temp 36.4 C 06/22/23 1154  Pulse 64 06/22/23 1219  Resp 14 06/22/23 1219  SpO2 97 % 06/22/23 1219  Vitals shown include unfiled device data.  Last Pain:  Vitals:   06/22/23 1200  PainSc: Asleep         Complications: There were no known notable events for this encounter.

## 2023-06-22 NOTE — Discharge Instructions (Addendum)
 Anal Fistula Discharge Instructions:   TAKE YOUR DIABETES MEDICATION AND KEEP YOUR BLOOD SUGAR UNDER CONTROL.   Wound Instructions: Start packing your wound at least twice daily if not more often (after each sitz bath) starting this evening (06/22/23). Some minor bleeding is expected. Replace the packing and this bleeding will stop. You can allow the packing that is in now to soften up in the sitz bath and remove it (or it will fall out) and then after the sitz bath replace it with new saline dampened kerlix (cut to size).   You will need to do this at least 2 times a day if not more often at the start. If you have a BM, you should replace your packing/kerlix and clean the area with a shower or kerlix.   Replace Feminine pad for drainage as needed.   Hygiene: Keep the surgical area clean by taking Sitz baths (or tub baths) several times per day. These are helpful for cleaning after each bowel movement. Frequent showering is an alternative to sitz baths, although many patients find baths soothing after surgery.  Equipment for [employer] can be purchased at [employer] or medical/surgical supply stores. o Use warm (not hot) water for cleansing.  Sitting on a pillow or ice pack may also provide relief. Do not apply ice directly to the skin, use a towel or pillow case as a buffer. We do not encourage sitting on an inflatable ring ("doughnut") as this leads to unopposed downward pressure in the anal area.  Care Instructions: You can resume your normal diet once you have sufficiently recovered from anesthesia. You should drink lots of liquid. Water is best. Try to drink at least 6-8 glasses of liquid daily.  Medications: It is very important to prevent constipation after surgery. You should take a stool softener, such as docusate sodium (Colace), twice a day for the first two weeks. Also take a fiber supplement such as Benefiber, Citrucel, or Metamucil, twice a day every day. Consult  your pharmacist if you need help. If your stools become loose, you can stop taking the softeners.  Take tylenol  and ibuprofen as needed for pain control, alternating every 4-6 hours.  Example:  Tylenol  1000mg  @ 6am, 12noon, 6pm, (Do not exceed 4000mg  of tylenol  a day).  Ibuprofen 800mg  @ 9am, 3pm, 9pm, 3am (Do not exceed 3600mg  of ibuprofen a day).  Take Roxicodone  for breakthrough pain every 4 hours.  Drink plenty of water to also prevent constipation.  Do not drive, operate machinery, or drink alcohol when taking narcotic pain medication.  Within a few days, your pain should be sufficiently controlled with medications such as ibuprofen or acetaminophen .

## 2023-06-22 NOTE — Anesthesia Procedure Notes (Signed)
 Procedure Name: LMA Insertion Date/Time: 06/22/2023 10:40 AM  Performed by: Coretha Dew, MDPre-anesthesia Checklist: Patient identified, Emergency Drugs available, Suction available, Patient being monitored and Timeout performed Patient Re-evaluated:Patient Re-evaluated prior to induction Oxygen Delivery Method: Circle system utilized Preoxygenation: Pre-oxygenation with 100% oxygen Induction Type: IV induction LMA: LMA inserted LMA Size: 4.0 Number of attempts: 1 Placement Confirmation: positive ETCO2 and breath sounds checked- equal and bilateral Tube secured with: Tape Dental Injury: Teeth and Oropharynx as per pre-operative assessment

## 2023-06-22 NOTE — Interval H&P Note (Signed)
 History and Physical Interval Note:  06/22/2023 10:22 AM  Nathan [name]  has presented today for surgery, with the diagnosis of Anal fissure.  The various methods of treatment have been discussed with the patient and family. After consideration of risks, benefits and other options for treatment, the patient has consented to  Procedure(s): ANAL FISTULOTOMY (N/A) as a surgical intervention.  The patient's history has been reviewed, patient examined, no change in status, stable for surgery.  I have reviewed the patient's chart and labs.  Questions were answered to the patient's satisfaction.    Patient has not taken any medication in 3 days and has been out. Healing is effected by the BS being high and reiterated this to him again.   Awilda Bogus

## 2023-06-22 NOTE — Progress Notes (Addendum)
 [facility]  Patient reported to be preoperatively the area had opened. This had not been open when I saw him in the office.   I did note this in the operating room. I discussed this with Archibald Beard. I explained that the are had fistulized in a similar location as prior and that I worry that this was due to the area closing premature in the deep space. I have performed a fistulectomy (removing the track) this time and they have to pack the wound twice daily if not more than that with sitz baths. They may need to pack the area 4-5 times a day with sitz baths and Bms.  Will send with multiple kerlix gauze to cut to size and saline.  Future Appointments  Date Time Provider Department Center  06/29/2023  1:00 PM Awilda Bogus, MD [facility] None  08/01/2023 10:00 AM Wendel Hals, NP [facility] None     Deena Farrier, MD [facility] [address] [place], [place] [address] [phone] (office)

## 2023-06-22 NOTE — Op Note (Signed)
 [facility] Operative Note  06/22/23  Preoperative Diagnosis: Perianal fistula    Postoperative Diagnosis: Superficial posterior perianal fistula (in the adipose of the posterior perianal region)   Procedure(s) Performed: Fistulectomy    Surgeon: Nathan [name]. 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 He says the area has been draining and opening up, and reports that in the last 2 days the area opened up. We discussed exam under anesthesia, possible fistulotomy, possible seton placement, and risk of bleeding, infection, needing referral for more in-depth pathology.   Findings: Posterior right of midline 2-3cm from the anal verge opening with granulating track, extended superiorly to the verge in the adipose tissue to just across midline at 5 o'clock; fistula track excised    Procedure: The patient was taken to the operating room and placed supine. General endotracheal anesthesia was induced. Intravenous antibiotics were  administered due to his uncontrolled diabetes.  He was then placed in lithotomy with his pressure points padded and his glutes taped open.   On manual exam I noted no masses. I did note significant stool burden and evacuated this manually. I then reprepped the area with more betadine and changed outer gloves.    There was an opening with granulation  tissue at 2-3cm from the anal verge on the right posterior region.  This was probed with a slick catheter and peroxide was injected while an anoscope was held in the anal verge to see if any tracks were visible. Unfortunately, the  majority of the peroxide came out the opening.  From here I tried to see if I could note any openings at the anal verge, and noted some indentions that could be openings in the 5-6 o'clock region.  I did not probe this as I did not want to create a false opening.  I then turned back to the granulated opening on the perianal adipose, and placed a mammary probe into the area and noted that this tracked superiorly to the verge at about the 5-6 o'clock position. I injected methylene blue  with the slick catheter into the track. From the opening on the perianal adipose/ glute on the right, I carefully opened the space with cut cautery.  Opening the track as I went on the probe. Due to the depth adipose tissue in this region, I did this in a step wise manner. I checked the sphincter muscles and this track was superficial to those but was deep in the adipose.  Once I was to the anal verge opening, I completed the opening of the track. Due to the depth of the adipose and his recurrence, I decided to fully remove the fistula track doing a fistulectomy instead of a fistulotomy to help with reducing the risk of recurrence.  The tissue was sent to pathology. I tried to remove as much as the dyed tissue in the track and cavity and cauterized the remaining tissue. Hemostasis was achieved.   I then packed the area with saline dampened kerlix. Due to his allergy of marcaine  and per him lidocaine  and other local anesthesia, I could not inject anything.  ABD and mesh panties were placed. Final inspection revealed acceptable hemostasis. All counts were correct at the end of the case. The patient was awakened from anesthesia and extubated without complication.  The patient went to the PACU in stable condition.   Nathan Farrier, MD [facility] [address] [name], [place] [address] [phone] (office)

## 2023-06-22 NOTE — ED Triage Notes (Addendum)
 Pt reports:  Surgery this morning Bleeding from site On buttocks Attempted to change tonight

## 2023-06-22 NOTE — ED Notes (Signed)
 ED Provider at bedside.

## 2023-06-22 NOTE — ED Notes (Signed)
 Registration at bedside.

## 2023-06-22 NOTE — Anesthesia Preprocedure Evaluation (Signed)
 Anesthesia Evaluation  Patient identified by MRN, date of birth, ID band Patient awake    Reviewed: Allergy & Precautions, H&P , NPO status , Patient's Chart, lab work & pertinent test results, reviewed documented beta blocker date and time   Airway Mallampati: II  TM Distance: >3 FB Neck ROM: full    Dental no notable dental hx.    Pulmonary sleep apnea , pneumonia, Patient abstained from smoking., former smoker   Pulmonary exam normal breath sounds clear to auscultation       Cardiovascular Exercise Tolerance: Good hypertension, + dysrhythmias  Rhythm:regular Rate:Normal     Neuro/Psych negative neurological ROS  negative psych ROS   GI/Hepatic negative GI ROS, Neg liver ROS,,,  Endo/Other  diabetesHypothyroidism    Renal/GU negative Renal ROS  negative genitourinary   Musculoskeletal   Abdominal   Peds  Hematology negative hematology ROS (+)   Anesthesia Other Findings   Reproductive/Obstetrics negative OB ROS                             Anesthesia Physical Anesthesia Plan  ASA: 3  Anesthesia Plan: General and General LMA   Post-op Pain Management:    Induction:   PONV Risk Score and Plan: Ondansetron   Airway Management Planned:   Additional Equipment:   Intra-op Plan:   Post-operative Plan:   Informed Consent: I have reviewed the patients History and Physical, chart, labs and discussed the procedure including the risks, benefits and alternatives for the proposed anesthesia with the patient or authorized representative who has indicated his/her understanding and acceptance.     Dental Advisory Given  Plan Discussed with: CRNA  Anesthesia Plan Comments:        Anesthesia Quick Evaluation

## 2023-06-23 ENCOUNTER — Encounter (HOSPITAL_COMMUNITY): Payer: Self-pay | Admitting: General Surgery

## 2023-06-23 ENCOUNTER — Telehealth: Payer: Self-pay | Admitting: *Deleted

## 2023-06-23 LAB — SURGICAL PATHOLOGY

## 2023-06-23 NOTE — Telephone Encounter (Signed)
-----   Message from Awilda Bogus sent at 06/23/2023  6:57 AM EDT ----- Regarding: RE: ER follow up I told them it might bleed and to repack it that it would stop. It was in the dc notes. I will make sure office calls and reiterates.  Sorry they came in. ----- Message ----- From: Orvilla Blander, MD Sent: 06/22/2023  11:44 PM EDT To: Awilda Bogus, MD Subject: ER follow up                                   Hello Dr. Collene Dawson.  Just wanted to let you know this patient was in the ER last night.  He was having some bleeding after the fistulotomy performed yesterday.  Bleeding was under control when I looked at it, so I didn't do much else other than reassure.  The wife was pretty concerned, so I told her I would let you know they were here.    Thanks,  Alphonza Ashing ED Physician

## 2023-06-23 NOTE — Telephone Encounter (Signed)
 Surgical Date: 06/22/2023 Procedure: Perianal Fistulectomy   Call placed to patient to follow up.   Patient reports that he was standing up for initial dressing change and noted to have copious bleeding that soaked through feminine pad within minutes. States that spouse re-packed wound and then they went to ER.   States that wound was examined and re-packed while at ER with minimal bleeding noted. States that he thinks he was in the wrong position for the dressing change and that may have put pressure on wound causing further bleeding.   States that he has not had dressing changed today, but there has been no further bleeding noted on pad. Advised removal of packing may cause some bleeding. Advised that repacking will help to control or stop any bleeding. Advised if a lot of bleeding continues and soaks through pad < 1 hour to return to ER.

## 2023-06-25 NOTE — Anesthesia Postprocedure Evaluation (Signed)
 Anesthesia Post Note  Patient: Nathan [name]  Procedure(s) Performed: SUPERFICIAL POSTERIOR PERIANAL FISTULECTOMY (Rectum)  Patient location during evaluation: [name] Anesthesia Type: General Level of consciousness: awake Pain management: pain level controlled Vital Signs Assessment: post-procedure vital signs reviewed and stable Respiratory status: spontaneous breathing and respiratory function stable Cardiovascular status: blood pressure returned to baseline and stable Postop Assessment: no headache and no apparent nausea or vomiting Anesthetic complications: no Comments: Late entry   There were no known notable events for this encounter.   Last Vitals:  Vitals:   06/22/23 1239 06/22/23 1249  BP: 129/79 116/83  Pulse: 72 65  Resp: 15 16  Temp: 36.4 C (!) 36.3 C  SpO2: 97% 100%    Last Pain:  Vitals:   06/22/23 1249  TempSrc: Oral  PainSc: 8                  Coretha Dew

## 2023-06-29 ENCOUNTER — Telehealth: Payer: Self-pay | Admitting: *Deleted

## 2023-06-29 ENCOUNTER — Ambulatory Visit (INDEPENDENT_AMBULATORY_CARE_PROVIDER_SITE_OTHER): Admitting: General Surgery

## 2023-06-29 ENCOUNTER — Encounter: Payer: Self-pay | Admitting: General Surgery

## 2023-06-29 VITALS — BP 143/97 | HR 90 | Temp 98.0°F | Resp 18 | Ht 71.0 in | Wt 207.0 lb

## 2023-06-29 DIAGNOSIS — K603 Anal fistula, unspecified: Secondary | ICD-10-CM

## 2023-06-29 NOTE — Patient Instructions (Addendum)
 Continue to pack the wound twice daily. Continue to do sitz baths and change any bandage after Bms. Keep stools regular and soft. Pack down into the area that is at the 6 oclock position where the yellowish material was on the cavity.

## 2023-06-29 NOTE — Progress Notes (Signed)
 [facility]  Doing well. Packing regularly and doing sitz baths. Having Bms. Pain ok. Not taking pain medication.  Pathology with fistula track findings.  BP (!) 143/97   Pulse 90   Temp 98 F (36.7 C) (Oral)   Resp 18   Ht 5\' 11"  (1.803 m)   Wt 207 lb (93.9 kg)   SpO2 98%   BMI 28.87 kg/m  Perianal cavity -Area with some granulation laterally on the walls but 6 o'clock position with more fibrinous exudate, packing replaced    Patient s/p fistulectomy for recurrent fistula. Packing now. Says blood sugars are better. Doing fair overall.   Continue to pack the wound twice daily. Continue to do sitz baths and change any bandage after Bms. Keep stools regular and soft. Pack down into the area that is at the 6 oclock position where the yellowish material was on the cavity.   Future Appointments  Date Time Provider Department Center  07/05/2023 10:40 AM Ether Hercules, MD [facility]  07/13/2023  1:15 PM Awilda Bogus, MD [facility] None  08/01/2023 10:00 AM Wendel Hals, NP RE[facility] None   Deena Farrier, MD [facility] [address] [place], [place] [address] [phone] (office)

## 2023-06-29 NOTE — Telephone Encounter (Signed)
 Received call from [facility] with [facility] Department [phone]~ telephone.   Reports that letter received stating that patient has recently had surgery and will need up to 90 days to heal. Requested to clarify that patient will not be able to work during this time.   Discussed with Dr. Collene Dawson and advised that exact healing time in unknown, but agrees that he will not be able to work until he has healed.   Laymon Priest verbalized understanding and will update patient chart.

## 2023-07-05 ENCOUNTER — Other Ambulatory Visit (HOSPITAL_COMMUNITY): Payer: Self-pay

## 2023-07-05 ENCOUNTER — Encounter: Payer: Self-pay | Admitting: Student in an Organized Health Care Education/Training Program

## 2023-07-05 ENCOUNTER — Telehealth: Payer: Self-pay

## 2023-07-05 ENCOUNTER — Ambulatory Visit: Admitting: Student in an Organized Health Care Education/Training Program

## 2023-07-05 VITALS — BP 161/92 | HR 84 | Wt 209.0 lb

## 2023-07-05 DIAGNOSIS — I10 Essential (primary) hypertension: Secondary | ICD-10-CM | POA: Diagnosis not present

## 2023-07-05 DIAGNOSIS — K603 Anal fistula, unspecified: Secondary | ICD-10-CM | POA: Diagnosis not present

## 2023-07-05 DIAGNOSIS — E1159 Type 2 diabetes mellitus with other circulatory complications: Secondary | ICD-10-CM

## 2023-07-05 DIAGNOSIS — E039 Hypothyroidism, unspecified: Secondary | ICD-10-CM

## 2023-07-05 DIAGNOSIS — E782 Mixed hyperlipidemia: Secondary | ICD-10-CM

## 2023-07-05 DIAGNOSIS — R7989 Other specified abnormal findings of blood chemistry: Secondary | ICD-10-CM

## 2023-07-05 DIAGNOSIS — Z7985 Long-term (current) use of injectable non-insulin antidiabetic drugs: Secondary | ICD-10-CM

## 2023-07-05 DIAGNOSIS — Z7984 Long term (current) use of oral hypoglycemic drugs: Secondary | ICD-10-CM

## 2023-07-05 MED ORDER — GVOKE HYPOPEN 2-PACK 1 MG/0.2ML ~~LOC~~ SOAJ: 1.0000 mg | SUBCUTANEOUS | 0 refills | Status: AC | PRN

## 2023-07-05 MED ORDER — ROSUVASTATIN CALCIUM 10 MG PO TABS
10.0000 mg | ORAL_TABLET | Freq: Every day | ORAL | 3 refills | Status: AC
Start: 2023-07-05 — End: ?

## 2023-07-05 MED ORDER — EMPAGLIFLOZIN 25 MG PO TABS: 25.0000 mg | ORAL_TABLET | Freq: Every day | ORAL | 3 refills | Status: AC

## 2023-07-05 MED ORDER — LANCETS MISC: 1.0000 | Freq: Three times a day (TID) | 0 refills | Status: DC

## 2023-07-05 MED ORDER — SEMAGLUTIDE(0.25 OR 0.5MG/DOS) 2 MG/3ML ~~LOC~~ SOPN: 0.2500 mg | PEN_INJECTOR | SUBCUTANEOUS | 1 refills | Status: DC

## 2023-07-05 MED ORDER — BLOOD GLUCOSE TEST VI STRP: 1.0000 | ORAL_STRIP | Freq: Three times a day (TID) | 0 refills | Status: DC

## 2023-07-05 MED ORDER — LANTUS SOLOSTAR 100 UNIT/ML ~~LOC~~ SOPN: 15.0000 [IU] | PEN_INJECTOR | Freq: Every day | SUBCUTANEOUS | 2 refills | Status: DC

## 2023-07-05 MED ORDER — LEVOTHYROXINE SODIUM 88 MCG PO TABS
88.0000 ug | ORAL_TABLET | Freq: Every day | ORAL | 0 refills | Status: AC
Start: 2023-07-05 — End: ?

## 2023-07-05 MED ORDER — AMLODIPINE BESYLATE-VALSARTAN 5-160 MG PO TABS: 1.0000 | ORAL_TABLET | Freq: Every day | ORAL | 1 refills | Status: DC

## 2023-07-05 MED ORDER — TESTOSTERONE CYPIONATE 200 MG/ML IM SOLN: 200.0000 mg | INTRAMUSCULAR | 0 refills | Status: DC

## 2023-07-05 MED ORDER — BLOOD GLUCOSE MONITORING SUPPL DEVI: 1.0000 | Freq: Three times a day (TID) | 0 refills | Status: DC

## 2023-07-05 MED ORDER — LANCET DEVICE MISC: 1.0000 | Freq: Three times a day (TID) | 0 refills | Status: DC

## 2023-07-05 MED ORDER — PEN NEEDLES 31G X 5 MM MISC: 1.0000 | Freq: Three times a day (TID) | 0 refills | Status: AC

## 2023-07-05 NOTE — Telephone Encounter (Signed)
 Called patient to let him know testosterone has been sent to [facility] in [place]

## 2023-07-05 NOTE — Assessment & Plan Note (Signed)
 Chronic and uncontrolled today.  Given his uncontrolled diabetes would like RAAS in addition.  I am going to start amlodipine  5 mg and valsartan 160 mg once daily.  Combination pill might help with consistency.  Follow-up in 4 weeks for blood pressure recheck.

## 2023-07-05 NOTE — Telephone Encounter (Signed)
 Pharmacy Patient Advocate Encounter  Received notification from [facility] MEDICAID that Prior Authorization for Ozempic  2 has been APPROVED from 07/05/23 to 07/04/24. Ran test claim, Copay is $4.00. This test claim was processed through [facility]- copay amounts may vary at other pharmacies due to pharmacy/plan contracts, or as the patient moves through the different stages of their insurance plan.   PA #/Case ID/Reference #: [id number]

## 2023-07-05 NOTE — Progress Notes (Signed)
 New Patient Office Visit  Subjective    Patient ID: Nathan [name] [name], male    DOB: December 09, 1975  Age: 48 y.o. MRN: [id number]  CC:  Chief Complaint  Patient presents with   Establish Care    Would like to go over Testosterone levels.  A1C completed 2 weeks ago in hospital encounter  [facility] [place]     HPI  Nathan [name] presents to establish care  48 year old man here for management of diabetes and hypertension.  Patient's previous primary care physician was in [place] and is retiring soon.  He has had some significant health problems over the last few years, most recently has had a difficult to heal skin and soft tissue infection with a deep perianal fistula.  He had a second surgery on this a few weeks ago.  Currently getting wet-to-dry dressing changes.  Has a lot of discomfort because of the wound that is healing.  Reports having diabetes for over 20 years.  Has not been consistent with medication management.  Reports having intermittent chest discomfort, says he has had ischemic evaluations with cardiology in the past and was told that he had heart spasms.  Lives with his wife and daughter.  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  He struggles with diet, likes to drink milkshakes and the ice cream.  He has some depressed mood because he is feeling limited physically, struggles to get off the couch, not able to exercise like he used to.    Outpatient Encounter Medications as of 07/05/2023  Medication Sig   amLODipine -valsartan (EXFORGE) 5-160 MG tablet Take 1 tablet by mouth daily.   Blood Glucose Monitoring Suppl DEVI 1 each by Does not apply route 3 (three) times daily. May dispense any manufacturer covered by patient's insurance.   Glucagon (GVOKE HYPOPEN 2-PACK) 1 MG/0.2ML SOAJ Inject 1 mg into the skin as needed for up to 2 doses (Severe  low blood sugar).   Glucose Blood (BLOOD GLUCOSE TEST STRIPS) STRP 1 each by Does not apply route 3 (three) times daily. Use as directed to check blood sugar. May dispense any manufacturer covered by patient's insurance and fits patient's device.   Insulin  Pen Needle (PEN NEEDLES) 31G X 5 MM MISC 1 each by Does not apply route 3 (three) times daily. May dispense any manufacturer covered by patient's insurance.   Lancet Device MISC 1 each by Does not apply route 3 (three) times daily. May dispense any manufacturer covered by patient's insurance.   Lancets MISC 1 each by Does not apply route 3 (three) times daily. Use as directed to check blood sugar. May dispense any manufacturer covered by patient's insurance and fits patient's device.   Semaglutide ,0.25 or 0.5MG /DOS, 2 MG/3ML SOPN Inject 0.25 mg into the skin once a week.   [DISCONTINUED] amLODipine  (NORVASC ) 10 MG tablet Take 1 tablet (10 mg total) by mouth daily.   [DISCONTINUED] Coenzyme Q10 (COQ10) 100 MG CAPS Take by mouth.   [DISCONTINUED] docusate sodium  (COLACE) 100 MG capsule Take 1 capsule (100 mg total) by mouth 2 (two) times daily.   [DISCONTINUED] glimepiride (AMARYL) 4 MG tablet Take 8 mg by mouth daily.   [DISCONTINUED] LANTUS SOLOSTAR 100 UNIT/ML Solostar Pen SMARTSIG:20-80 Unit(s) SUB-Q Every Night   [DISCONTINUED] levothyroxine  (SYNTHROID ) 88 MCG tablet TAKE 1 TABLET BY MOUTH ONCE DAILY BEFORE  BREAKFAST   [DISCONTINUED] oxyCODONE  (ROXICODONE ) 5 MG immediate release tablet Take 1 tablet (5 mg total) by mouth every 4 (four) hours as needed for severe pain (pain score 7-10) or breakthrough pain.   [DISCONTINUED] rosuvastatin (CRESTOR) 10 MG tablet Take 1 tablet by mouth daily.   [DISCONTINUED] testosterone cypionate (DEPOTESTOSTERONE CYPIONATE) 200 MG/ML injection Inject 200 mg into the muscle every 14 (fourteen) days.   empagliflozin  (JARDIANCE ) 25 MG TABS tablet Take 1 tablet (25 mg total) by mouth daily before breakfast.   LANTUS  SOLOSTAR 100 UNIT/ML Solostar Pen Inject 15 Units into the skin daily.   levothyroxine  (SYNTHROID ) 88 MCG tablet Take 1 tablet (88 mcg total) by mouth daily before breakfast.   rosuvastatin (CRESTOR) 10 MG tablet Take 1 tablet (10 mg total) by mouth daily.   testosterone cypionate (DEPOTESTOSTERONE CYPIONATE) 200 MG/ML injection Inject 1 mL (200 mg total) into the muscle every 14 (fourteen) days.   [DISCONTINUED] empagliflozin  (JARDIANCE ) 25 MG TABS tablet Take 1 tablet (25 mg total) by mouth daily before breakfast.   [DISCONTINUED] tadalafil  (CIALIS ) 20 MG tablet Take 1 tablet (20 mg total) by mouth as needed. (Patient not taking: Reported on 07/05/2023)   No facility-administered encounter medications on file as of 07/05/2023.    Past Medical History:  Diagnosis Date   Cardiac arrest [facility])    Chest pain    Diabetes mellitus    Dysrhythmia    HLD (hyperlipidemia)    Hypertension    Obesity    Pneumonia    Sleep apnea    Syncope    Thyroid  disease    TMJ (dislocation of temporomandibular joint)     Past Surgical History:  Procedure Laterality Date   ANAL FISTULOTOMY N/A 10/27/2022   Procedure: ANAL FISTULOTOMY, EXAM UNDER ANESTHESIA;  Surgeon: Awilda Bogus, MD;  Location: [facility];  Service: General;  Laterality: N/A;   ANAL FISTULOTOMY N/A 06/22/2023   Procedure: SUPERFICIAL POSTERIOR PERIANAL FISTULECTOMY;  Surgeon: Awilda Bogus, MD;  Location: [facility];  Service: General;  Laterality: N/A;   CARDIAC CATHETERIZATION     no blockage   HAND SURGERY Right    pinning of right pinky finger    Family History  Problem Relation Age of Onset   Heart failure Mother    Diabetes Mother    Heart attack Father        x5    Diabetes Father     Social History   Socioeconomic History   Marital status: Married    Spouse name: Not on file   Number of children: Not on file   Years of education: Not on file   Highest education level: Associate degree: occupational, Scientist, product/process development,  or vocational program  Occupational History   Not on file  Tobacco Use   Smoking status: Former    Current packs/day: 0.00    Types: Cigarettes    Start date: 01/24/2002    Quit date: 01/25/2008    Years since quitting: 15.4   Smokeless tobacco: Never  Vaping Use   Vaping status: Not on file  Substance and Sexual Activity   Alcohol use: Not Currently   Drug use: No   Sexual activity: Not on file  Other Topics Concern   Not on file  Social History Narrative   Not on file   Social Drivers of Health   Financial Resource Strain: High Risk (07/04/2023)   Overall Financial Resource Strain (CARDIA)    Difficulty of Paying Living Expenses: Very hard  Food Insecurity: Food Insecurity Present (07/04/2023)   Hunger Vital Sign    Worried About Running Out of Food in the Last Year: Often true    Ran Out of Food in the Last Year: Often true  Transportation Needs: No Transportation Needs (07/04/2023)   PRAPARE - Administrator, Civil Service (Medical): No    Lack of Transportation (Non-Medical): No  Physical Activity: Insufficiently Active (07/04/2023)   Exercise Vital Sign    Days of Exercise per Week: 3 days    Minutes of Exercise per Session: 30 min  Stress: No Stress Concern Present (07/04/2023)   [employer] of Occupational Health - Occupational Stress Questionnaire    Feeling of Stress : Not at all  Social Connections: Moderately Isolated (07/04/2023)   Social Connection and Isolation Panel [NHANES]    Frequency of Communication with Friends and Family: Once a week    Frequency of Social Gatherings with Friends and Family: Once a week    Attends Religious Services: 1 to 4 times per year    Active Member of [employer] or Organizations: No    Attends Engineer, structural: Not on file    Marital Status: Married  Catering manager Violence: Not on file        Objective    BP (!) 161/92   Pulse 84   Wt 209 lb (94.8 kg)   SpO2 100%   BMI 29.15 kg/m    Physical Exam  Gen: Well-appearing man Neck: Normal thyroid , no adenopathy Heart: Regular, no murmur Lungs: Unlabored, clear throughout Ext: Warm, chronic trace pitting edema both lower extremities Skin: Multiple arthropod bites on his lower extremities bilaterally Neuro: Alert, oriented, full strength upper and lower extremity, gait is limited because of discomfort in his backside Psych: Appropriate mood and affect, not anxious or depressed appearing     Assessment & Plan:   Problem List Items Addressed This Visit       High   HTN (hypertension) (Chronic)   Chronic and uncontrolled today.  Given his uncontrolled diabetes would like RAAS in addition.  I am going to start amlodipine  5 mg and valsartan 160 mg once daily.  Combination pill might help with consistency.  Follow-up in 4 weeks for blood pressure recheck.      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  A1c 10% in May.  He is having complications of diabetes including recurrent skin and soft tissue infections, erectile dysfunction.  He has had inconsistency with using diabetes medications in the past.  We talked about a fresh start.  In the past could not tolerate metformin  because of GI side effects.  He is currently uncontrolled on Jardiance  and glimepiride.  I am going to suggest a regimen of Jardiance  25 mg daily, Lantus 15 units daily based on his weight, and start Ozempic  0.25 mg weekly.  Follow-up in 1 month.  I sent supplies for fasting fingerstick glucose testing to be done once daily.      Relevant Medications   Blood Glucose Monitoring Suppl DEVI   Glucose Blood (BLOOD GLUCOSE TEST STRIPS) STRP   Lancet Device MISC   Lancets MISC   Insulin  Pen Needle (PEN NEEDLES) 31G X 5 MM MISC   Glucagon (GVOKE HYPOPEN 2-PACK) 1 MG/0.2ML SOAJ    Semaglutide ,0.25 or 0.5MG /DOS, 2 MG/3ML SOPN  amLODipine -valsartan (EXFORGE) 5-160 MG tablet   empagliflozin  (JARDIANCE ) 25 MG TABS tablet   LANTUS SOLOSTAR 100 UNIT/ML Solostar Pen   rosuvastatin (CRESTOR) 10 MG tablet   Perianal fistula (Chronic)   Chronic and stable.  He had surgery about 2 weeks ago.  Is doing wet-to-dry dressing changes with the help of his wife.  This is being managed by surgery office.        Medium    Primary hypothyroidism (Chronic)   Relevant Medications   levothyroxine  (SYNTHROID ) 88 MCG tablet   Low testosterone (Chronic)   Patient reports a history of hypogonadism.  Has been on testosterone supplementation intermittently in the past.  Last dose was about 2 weeks ago on Friday.  About 2 weeks ago on Friday.  Current dosing is 200 mg every 2 weeks.  I reviewed the database which was appropriate.  I refilled testosterone.  I told him it was very important we get control over the hypertension and diabetes to lower his risk of an ischemic vascular event.      Relevant Medications   testosterone cypionate (DEPOTESTOSTERONE CYPIONATE) 200 MG/ML injection   Mixed hyperlipidemia (Chronic)   Relevant Medications   amLODipine -valsartan (EXFORGE) 5-160 MG tablet   rosuvastatin (CRESTOR) 10 MG tablet    Return in about 4 weeks (around 08/02/2023) for Diabetes management.   Ether Hercules, MD

## 2023-07-05 NOTE — Telephone Encounter (Signed)
 Pharmacy Patient Advocate Encounter   Received notification from Patient Pharmacy that prior authorization for Ozempic  2 is required/requested.   Insurance verification completed.   The patient is insured through [facility] MEDICAID .   Per test claim: PA required; PA submitted to above mentioned insurance via CoverMyMeds Key/confirmation #/[id number] Status is pending

## 2023-07-05 NOTE — Addendum Note (Signed)
 Addended by: Juel Nutley T on: 07/05/2023 01:54 PM   Modules accepted: Orders

## 2023-07-05 NOTE — Telephone Encounter (Signed)
 Thank you.  [facility] [place].

## 2023-07-05 NOTE — Assessment & Plan Note (Signed)
 Chronic and uncontrolled.  A1c 10% in May.  He is having complications of diabetes including recurrent skin and soft tissue infections, erectile dysfunction.  He has had inconsistency with using diabetes medications in the past.  We talked about a fresh start.  In the past could not tolerate metformin  because of GI side effects.  He is currently uncontrolled on Jardiance  and glimepiride.  I am going to suggest a regimen of Jardiance  25 mg daily, Lantus 15 units daily based on his weight, and start Ozempic  0.25 mg weekly.  Follow-up in 1 month.  I sent supplies for fasting fingerstick glucose testing to be done once daily.

## 2023-07-05 NOTE — Telephone Encounter (Signed)
 Called patient to let him know ozempic  has been approved and the test claim cost as well.  Patient states [facility] [place] [facility].

## 2023-07-05 NOTE — Assessment & Plan Note (Signed)
 Chronic and stable.  He had surgery about 2 weeks ago.  Is doing wet-to-dry dressing changes with the help of his wife.  This is being managed by surgery office.

## 2023-07-05 NOTE — Assessment & Plan Note (Signed)
 Patient reports a history of hypogonadism.  Has been on testosterone supplementation intermittently in the past.  Last dose was about 2 weeks ago on Friday.  About 2 weeks ago on Friday.  Current dosing is 200 mg every 2 weeks.  I reviewed the database which was appropriate.  I refilled testosterone.  I told him it was very important we get control over the hypertension and diabetes to lower his risk of an ischemic vascular event.

## 2023-07-13 ENCOUNTER — Ambulatory Visit (INDEPENDENT_AMBULATORY_CARE_PROVIDER_SITE_OTHER): Admitting: General Surgery

## 2023-07-13 ENCOUNTER — Encounter: Payer: Self-pay | Admitting: General Surgery

## 2023-07-13 VITALS — BP 147/89 | HR 84 | Temp 98.0°F | Resp 14 | Ht 71.0 in | Wt 208.0 lb

## 2023-07-13 DIAGNOSIS — K603 Anal fistula, unspecified: Secondary | ICD-10-CM

## 2023-07-13 NOTE — Progress Notes (Signed)
 [facility]  No major issues. Packing going well.  BP (!) 147/89   Pulse 84   Temp 98 F (36.7 C) (Oral)   Resp 14   Ht 5' 11 (1.803 m)   Wt 208 lb (94.3 kg)   SpO2 98%   BMI 29.01 kg/m  Posterior fistulectomy site with granulation tissue, minimal drainage  Patient s/p anal fistulectomy for recurrent fistula. Doing well.  Keep area clean and continue your dressing changes and sitz baths. Call with issues.   Future Appointments  Date Time Provider Department Center  07/26/2023 10:15 AM Awilda Bogus, MD [facility] None  08/01/2023 10:00 AM Wendel Hals, NP [facility] None  08/02/2023 10:40 AM Ether Hercules, MD [facility]   Deena Farrier, MD [facility] [address] [place], [place] [address] [phone] (office)

## 2023-07-13 NOTE — Patient Instructions (Signed)
 Keep area clean and continue your dressing changes and sitz baths. Call with issues.

## 2023-07-26 ENCOUNTER — Encounter: Payer: Self-pay | Admitting: General Surgery

## 2023-07-26 ENCOUNTER — Ambulatory Visit (INDEPENDENT_AMBULATORY_CARE_PROVIDER_SITE_OTHER): Admitting: General Surgery

## 2023-07-26 VITALS — BP 133/93 | HR 95 | Temp 97.4°F | Resp 14 | Ht 71.0 in | Wt 206.0 lb

## 2023-07-26 DIAGNOSIS — K603 Anal fistula, unspecified: Secondary | ICD-10-CM

## 2023-07-26 NOTE — Progress Notes (Signed)
 [facility]  Doing well. Packing going well.  BP (!) 133/93   Pulse 95   Temp (!) 97.4 F (36.3 C) (Oral)   Resp 14   Ht 5' 11 (1.803 m)   Wt 206 lb (93.4 kg)   SpO2 98%   BMI 28.73 kg/m  Perianal region with granulation, minimal drainage   Patient s/p fistulectomy. Doing well.   Keep packing the area and keep stools regular and soft.   Future Appointments  Date Time Provider Department Center  08/01/2023 10:00 AM Therisa Benton PARAS, NP [facility] None  08/02/2023 10:40 AM Jerrell Cleatus Ned, MD [facility]  08/16/2023 10:45 AM Kallie Manuelita BROCKS, MD [facility] None   Manuelita Kallie, MD [facility] [address] [place] [address] [place], [place] [address] [phone] (office)

## 2023-07-26 NOTE — Patient Instructions (Signed)
 Keep packing the area and keep stools regular and soft.

## 2023-08-01 ENCOUNTER — Encounter (INDEPENDENT_AMBULATORY_CARE_PROVIDER_SITE_OTHER): Admitting: Nurse Practitioner

## 2023-08-01 DIAGNOSIS — E782 Mixed hyperlipidemia: Secondary | ICD-10-CM

## 2023-08-01 DIAGNOSIS — I10 Essential (primary) hypertension: Secondary | ICD-10-CM

## 2023-08-01 DIAGNOSIS — Z794 Long term (current) use of insulin: Secondary | ICD-10-CM

## 2023-08-01 DIAGNOSIS — Z7984 Long term (current) use of oral hypoglycemic drugs: Secondary | ICD-10-CM

## 2023-08-01 DIAGNOSIS — E1165 Type 2 diabetes mellitus with hyperglycemia: Secondary | ICD-10-CM

## 2023-08-01 DIAGNOSIS — E039 Hypothyroidism, unspecified: Secondary | ICD-10-CM

## 2023-08-01 NOTE — Progress Notes (Unsigned)
 Endocrinology Consult Note       08/01/2023, 7:34 AM   Subjective:    Patient ID: Nathan [name], male    DOB: 01/06/1976.  Nathan [name] is being seen in consultation for management of currently uncontrolled symptomatic diabetes requested by  Jerrell Cleatus Ned, MD.  He has been seen by several Endocrinologists in the past including Dr. Lenis, Dr. Von, Dr. Kassie.   Past Medical History:  Diagnosis Date   Cardiac arrest [facility])    Chest pain    Diabetes mellitus    Dysrhythmia    HLD (hyperlipidemia)    Hypertension    Obesity    Pneumonia    Sleep apnea    Syncope    Thyroid  disease    TMJ (dislocation of temporomandibular joint)     Past Surgical History:  Procedure Laterality Date   ANAL FISTULOTOMY N/A 10/27/2022   Procedure: ANAL FISTULOTOMY, EXAM UNDER ANESTHESIA;  Surgeon: Kallie Manuelita BROCKS, MD;  Location: [facility];  Service: General;  Laterality: N/A;   ANAL FISTULOTOMY N/A 06/22/2023   Procedure: SUPERFICIAL POSTERIOR PERIANAL FISTULECTOMY;  Surgeon: Kallie Manuelita BROCKS, MD;  Location: [facility];  Service: General;  Laterality: N/A;   CARDIAC CATHETERIZATION     no blockage   HAND SURGERY Right    pinning of right pinky finger    Social History   Socioeconomic History   Marital status: Married    Spouse name: Not on file   Number of children: Not on file   Years of education: Not on file   Highest education level: Associate degree: occupational, Scientist, product/process development, or vocational program  Occupational History   Not on file  Tobacco Use   Smoking status: Former    Current packs/day: 0.00    Types: Cigarettes    Start date: 01/24/2002    Quit date: 01/25/2008    Years since quitting: 15.5   Smokeless tobacco: Never  Vaping Use   Vaping status: Not on file  Substance and Sexual Activity   Alcohol use: Not Currently   Drug use: No   Sexual activity: Not on file  Other Topics  Concern   Not on file  Social History Narrative   Not on file   Social Drivers of Health   Financial Resource Strain: High Risk (07/04/2023)   Overall Financial Resource Strain (CARDIA)    Difficulty of Paying Living Expenses: Very hard  Food Insecurity: Food Insecurity Present (07/04/2023)   Hunger Vital Sign    Worried About Running Out of Food in the Last Year: Often true    Ran Out of Food in the Last Year: Often true  Transportation Needs: No Transportation Needs (07/04/2023)   PRAPARE - Administrator, Civil Service (Medical): No    Lack of Transportation (Non-Medical): No  Physical Activity: Insufficiently Active (07/04/2023)   Exercise Vital Sign    Days of Exercise per Week: 3 days    Minutes of Exercise per Session: 30 min  Stress: No Stress Concern Present (07/04/2023)   [employer] of Occupational Health - Occupational Stress Questionnaire    Feeling of Stress : Not at all  Social Connections: Moderately Isolated (  07/04/2023)   Social Connection and Isolation Panel    Frequency of Communication with Friends and Family: Once a week    Frequency of Social Gatherings with Friends and Family: Once a week    Attends Religious Services: 1 to 4 times per year    Active Member of [employer] or Organizations: No    Attends Engineer, structural: Not on file    Marital Status: Married    Family History  Problem Relation Age of Onset   Heart failure Mother    Diabetes Mother    Heart attack Father        x5    Diabetes Father     Outpatient Encounter Medications as of 08/01/2023  Medication Sig   amLODipine -valsartan  (EXFORGE ) 5-160 MG tablet Take 1 tablet by mouth daily.   Blood Glucose Monitoring Suppl DEVI 1 each by Does not apply route 3 (three) times daily. May dispense any manufacturer covered by patient's insurance.   empagliflozin  (JARDIANCE ) 25 MG TABS tablet Take 1 tablet (25 mg total) by mouth daily before breakfast.   Glucagon  (GVOKE HYPOPEN   2-PACK) 1 MG/0.2ML SOAJ Inject 1 mg into the skin as needed for up to 2 doses (Severe low blood sugar).   Glucose Blood (BLOOD GLUCOSE TEST STRIPS) STRP 1 each by Does not apply route 3 (three) times daily. Use as directed to check blood sugar. May dispense any manufacturer covered by patient's insurance and fits patient's device.   Insulin  Pen Needle (PEN NEEDLES) 31G X 5 MM MISC 1 each by Does not apply route 3 (three) times daily. May dispense any manufacturer covered by patient's insurance.   Lancet Device MISC 1 each by Does not apply route 3 (three) times daily. May dispense any manufacturer covered by patient's insurance.   Lancets MISC 1 each by Does not apply route 3 (three) times daily. Use as directed to check blood sugar. May dispense any manufacturer covered by patient's insurance and fits patient's device.   LANTUS  SOLOSTAR 100 UNIT/ML Solostar Pen Inject 15 Units into the skin daily.   levothyroxine  (SYNTHROID ) 88 MCG tablet Take 1 tablet (88 mcg total) by mouth daily before breakfast.   rosuvastatin  (CRESTOR ) 10 MG tablet Take 1 tablet (10 mg total) by mouth daily.   Semaglutide ,0.25 or 0.5MG /DOS, 2 MG/3ML SOPN Inject 0.25 mg into the skin once a week.   testosterone  cypionate (DEPOTESTOSTERONE CYPIONATE) 200 MG/ML injection Inject 1 mL (200 mg total) into the muscle every 14 (fourteen) days.   No facility-administered encounter medications on file as of 08/01/2023.    ALLERGIES: Allergies  Allergen Reactions   Benzocaine Anaphylaxis and Other (See Comments)    REACTION: Blistering all over and throat swells shut   Lidocaine  Anaphylaxis   Orange Juice [Orange Oil] Anaphylaxis   Flavoring Agent (Non-Screening)    Grapefruit Concentrate     VACCINATION STATUS: Immunization History  Administered Date(s) Administered   Td (Adult),5 Lf Tetanus Toxid, Preservative Free 05/26/1995   Tdap 05/26/1995    Diabetes He presents for his initial diabetic visit. He has type 2 diabetes  mellitus. Onset time: Diagnosed at approx age of 7. His disease course has been fluctuating. Hypoglycemia symptoms include nervousness/anxiousness, sweats and tremors. Associated symptoms include blurred vision, fatigue, polydipsia and polyuria. There are no hypoglycemic complications. Symptoms are stable. There are no diabetic complications. Risk factors for coronary artery disease include diabetes mellitus, dyslipidemia, family history, male sex, obesity, hypertension and sedentary lifestyle. Current diabetic treatment includes insulin  injections and  oral agent (dual therapy). His weight is fluctuating minimally. He is following a generally unhealthy diet. When asked about meal planning, he reported none. He has not had a previous visit with a dietitian. He participates in exercise intermittently. An ACE inhibitor/angiotensin [name] receptor blocker is not being taken. He does not see a podiatrist.Eye exam is current.     Review of systems  Constitutional: + Minimally fluctuating body weight, current There is no height or weight on file to calculate BMI., no fatigue, no subjective hyperthermia, no subjective hypothermia Eyes: no blurry vision, no xerophthalmia ENT: no sore throat, no nodules palpated in throat, no dysphagia/odynophagia, no hoarseness Cardiovascular: no chest pain, no shortness of breath, no palpitations, no leg swelling Respiratory: no cough, no shortness of breath Gastrointestinal: no nausea/vomiting/diarrhea Musculoskeletal: no muscle/joint aches Skin: no rashes, no hyperemia Neurological: no tremors, no numbness, no tingling, no dizziness Psychiatric: no depression, no anxiety  Objective:     There were no vitals taken for this visit.  Wt Readings from Last 3 Encounters:  07/26/23 206 lb (93.4 kg)  07/13/23 208 lb (94.3 kg)  07/05/23 209 lb (94.8 kg)     BP Readings from Last 3 Encounters:  07/26/23 (!) 133/93  07/13/23 (!) 147/89  07/05/23 (!) 161/92     Physical  Exam- Limited  Constitutional:  There is no height or weight on file to calculate BMI. , not in acute distress, normal state of mind Eyes:  EOMI, no exophthalmos Neck: Supple Thyroid : No gross goiter Cardiovascular: RRR, no murmurs, rubs, or gallops, no edema Respiratory: Adequate breathing efforts, no crackles, rales, rhonchi, or wheezing Musculoskeletal: no gross deformities, strength intact in all four extremities, no gross restriction of joint movements Skin:  no rashes, no hyperemia Neurological: no tremor with outstretched hands   Diabetic Foot Exam - Simple   No data filed      CMP ( most recent) CMP     Component Value Date/Time   NA 132 (L) 06/21/2023 1304   NA 143 12/22/2015 1040   K 3.8 06/21/2023 1304   CL 96 (L) 06/21/2023 1304   CO2 25 06/21/2023 1304   GLUCOSE 368 (H) 06/21/2023 1304   BUN 9 06/21/2023 1304   BUN 14 04/06/2023 0000   CREATININE 0.73 06/21/2023 1304   CREATININE 0.85 02/15/2017 1505   CALCIUM  9.2 06/21/2023 1304   PROT 7.1 02/10/2023 1242   PROT 7.3 12/22/2015 1040   ALBUMIN 4.2 02/10/2023 1242   ALBUMIN 4.8 12/22/2015 1040   AST 14 (L) 02/10/2023 1242   ALT 30 02/10/2023 1242   ALKPHOS 91 02/10/2023 1242   BILITOT 0.8 02/10/2023 1242   BILITOT 0.6 12/22/2015 1040   GFR 105.66 01/07/2020 1639   EGFR 107 04/06/2023 0000   GFRNONAA >60 06/21/2023 1304   GFRNONAA >89 04/02/2016 1042     Diabetic Labs (most recent): Lab Results  Component Value Date   HGBA1C 10.1 (H) 06/21/2023   HGBA1C 9.8 04/06/2023   HGBA1C 10.6 (A) 01/29/2021   MICROALBUR 3.0 04/02/2016     Lipid Panel ( most recent) Lipid Panel     Component Value Date/Time   CHOL 235 (H) 12/22/2015 1040   TRIG 522 (A) 04/06/2023 0000   HDL 37 (L) 12/22/2015 1040   CHOLHDL 7.3 07/01/2010 0507   VLDL UNABLE TO CALCULATE IF TRIGLYCERIDE OVER 400 mg/dL 93/92/7987 9492   LDLCALC 110 04/06/2023 0000   LDLCALC Comment 12/22/2015 1040   LABVLDL Comment 12/22/2015 1040  Lab Results  Component Value Date   TSH 2.64 04/06/2023   TSH 4.51 (H) 01/07/2020   TSH 2.20 02/13/2018   TSH 2.821 11/04/2016   TSH 2.26 06/29/2016   TSH 1.610 12/22/2015   TSH 3.478 08/14/2015   TSH 2.794 07/01/2010   FREET4 0.79 01/07/2020   FREET4 1.01 11/04/2016   FREET4 1.2 06/29/2016   FREET4 1.36 12/22/2015   FREET4 0.78 08/14/2015           Assessment & Plan:   1) Type 2 diabetes mellitus with hyperglycemia, with long-term current use of insulin  (HCC) (Primary)    - Nathan [name] has currently uncontrolled symptomatic type 2 DM since 48 years of age, with most recent A1c of 10.1 %.   -Recent labs reviewed.  - I had a long discussion with him about the progressive nature of diabetes and the pathology behind its complications. -his diabetes is not currently complicated but he remains at a high risk for more acute and chronic complications which include CAD, CVA, CKD, retinopathy, and neuropathy. These are all discussed in detail with him.  The following Lifestyle Medicine recommendations according to American College of Lifestyle Medicine [facility]) were discussed and offered to patient and he agrees to start the journey:  A. Whole Foods, Plant-based plate comprising of fruits and vegetables, plant-based proteins, whole-grain carbohydrates was discussed in detail with the patient.   A list for source of those nutrients were also provided to the patient.  Patient will use only water or unsweetened tea for hydration. B.  The need to stay away from risky substances including alcohol, smoking; obtaining 7 to 9 hours of restorative sleep, at least 150 minutes of moderate intensity exercise weekly, the importance of healthy social connections,  and stress reduction techniques were discussed. C.  A full color page of  Calorie density of various food groups per pound showing examples of each food groups was provided to the patient.  - I have counseled him on diet and  weight management by adopting a carbohydrate restricted/protein rich diet. Patient is encouraged to switch to unprocessed or minimally processed complex starch and increased protein intake (animal or plant source), fruits, and vegetables. -  he is advised to stick to a routine mealtimes to eat 3 meals a day and avoid unnecessary snacks (to snack only to correct hypoglycemia).   - he acknowledges that there is a room for improvement in his food and drink choices. - Suggestion is made for him to avoid simple carbohydrates from his diet including Cakes, Sweet Desserts, Ice Cream, Soda (diet and regular), Sweet Tea, Candies, Chips, Cookies, Store Bought Juices, Alcohol in Excess of 1-2 drinks a day, Artificial Sweeteners, Coffee Creamer, and Sugar-free Products. This will help patient to have more stable blood glucose profile and potentially avoid unintended weight gain.  - I have approached him with the following individualized plan to manage his diabetes and patient agrees:    -he is encouraged to start/continue monitoring glucose 4 times daily, before meals and before bed, to log their readings on the clinic sheets provided, and bring them to review at follow up appointment in 3 months.  - he is warned not to take insulin  without proper monitoring per orders. - Adjustment parameters are given to him for hypo and hyperglycemia in writing. - he is encouraged to call clinic for blood glucose levels less than 70 or above 300 mg /dl. - he is advised to continue ***, therapeutically suitable for patient . -  his *** will be discontinued, risk outweighs benefit for this patient.  - Specific targets for  A1c; LDL, HDL, and Triglycerides were discussed with the patient.  2) Blood Pressure /Hypertension:  his blood pressure is controlled to target.   he is advised to continue his current medications as prescribed by PCP.  3) Lipids/Hyperlipidemia:    Review of his recent lipid panel from 04/06/23 showed  uncontrolled LDL at 113 and significantly elevated triglycerides of 522 .  he is advised to continue Crestor  10 mg daily at bedtime.  Side effects and precautions discussed with him.  4)  Weight/Diet:  his There is no height or weight on file to calculate BMI.  -  clearly complicating his diabetes care.   he is a candidate for weight loss. I discussed with him the fact that loss of 5 - 10% of his  current body weight will have the most impact on his diabetes management.  Exercise, and detailed carbohydrates information provided  -  detailed on discharge instructions.  5) Chronic Care/Health Maintenance: -he is not on ACEI/ARB and is on Statin medications and is encouraged to initiate and continue to follow up with Ophthalmology, Dentist, Podiatrist at least yearly or according to recommendations, and advised to *** stay away from smoking. I have recommended yearly flu vaccine and pneumonia vaccine at least every 5 years; moderate intensity exercise for up to 150 minutes weekly; and sleep for at least 7 hours a day.  6) Hypothyroidism-unspecified Currently on Levothyroxine  88 mcg.  Recent TSH from 3/13 was normal.  Will repeat on subsequent visits and adjust dose accordingly.  - The correct intake of thyroid  hormone (Levothyroxine , Synthroid ), is on empty stomach first thing in the morning, with water, separated by at least 30 minutes from breakfast and other medications,  and separated by more than 4 hours from calcium , iron, multivitamins, acid reflux medications (PPIs).  - This medication is a life-long medication and will be needed to correct thyroid  hormone imbalances for the rest of your life.  The dose may change from time to time, based on thyroid  blood work.  - It is extremely important to be consistent taking this medication, near the same time each morning.  -AVOID TAKING PRODUCTS CONTAINING BIOTIN (commonly found in Hair, Skin, Nails vitamins) AS IT INTERFERES WITH THE VALIDITY OF THYROID   FUNCTION BLOOD TESTS.   - he is advised to maintain close follow up with Jerrell Cleatus Ned, MD for primary care needs, as well as his other providers for optimal and coordinated care.   - Time spent in this patient care: 60 min, which was spent in counseling him about his diabetes and the rest reviewing his blood glucose logs, discussing his hypoglycemia and hyperglycemia episodes, reviewing his current and previous labs/studies (including abstraction from other facilities) and medications doses and developing a long term treatment plan based on the latest standards of care/guidelines; and documenting his care.    Please refer to Patient Instructions for Blood Glucose Monitoring and Insulin /Medications Dosing Guide in media tab for additional information. Please also refer to Patient Self Inventory in the Media tab for reviewed elements of pertinent patient history.  Nathan [name] participated in the discussions, expressed understanding, and voiced agreement with the above plans.  All questions were answered to his satisfaction. he is encouraged to contact clinic should he have any questions or concerns prior to his return visit.     Follow up plan: - No follow-ups on file.  Benton Rio, [facility] [address] [place], [place] [address] Phone: [phone] Fax: [phone]  08/01/2023, 7:34 AM

## 2023-08-02 ENCOUNTER — Encounter: Payer: Self-pay | Admitting: Student in an Organized Health Care Education/Training Program

## 2023-08-02 ENCOUNTER — Ambulatory Visit: Admitting: Student in an Organized Health Care Education/Training Program

## 2023-08-02 VITALS — BP 143/99 | HR 91 | Wt 203.0 lb

## 2023-08-02 DIAGNOSIS — I10 Essential (primary) hypertension: Secondary | ICD-10-CM

## 2023-08-02 DIAGNOSIS — K603 Anal fistula, unspecified: Secondary | ICD-10-CM

## 2023-08-02 DIAGNOSIS — R7989 Other specified abnormal findings of blood chemistry: Secondary | ICD-10-CM

## 2023-08-02 DIAGNOSIS — E1159 Type 2 diabetes mellitus with other circulatory complications: Secondary | ICD-10-CM

## 2023-08-02 DIAGNOSIS — Z7985 Long-term (current) use of injectable non-insulin antidiabetic drugs: Secondary | ICD-10-CM

## 2023-08-02 MED ORDER — LANTUS SOLOSTAR 100 UNIT/ML ~~LOC~~ SOPN: 20.0000 [IU] | PEN_INJECTOR | Freq: Every day | SUBCUTANEOUS | 2 refills | Status: AC

## 2023-08-02 MED ORDER — SEMAGLUTIDE(0.25 OR 0.5MG/DOS) 2 MG/3ML ~~LOC~~ SOPN: 0.5000 mg | PEN_INJECTOR | SUBCUTANEOUS | 1 refills | Status: DC

## 2023-08-02 NOTE — Assessment & Plan Note (Signed)
 Blood pressure well-controlled with current regimen. Home readings around 130/78 mmHg, office reading 143/99 mmHg. No lightheadedness on standing. - Continue current antihypertensive medication regimen. - Monitor blood pressure at home regularly.

## 2023-08-02 NOTE — Assessment & Plan Note (Signed)
 Diabetes managed with Lantus , Ozempic , and Jardiance . Blood glucose levels variable, fasting around 170 mg/dL, highs up to 782 mg/dL. Active wound requires glucose under 180 mg/dL for healing. Recommended Lantus  increase to 20 units daily and Ozempic  to 0.5 mg weekly post current pen. Discussed Ozempic  side effects and importance of consistent medication and sleep schedules. - Increase Lantus  to 20 units subcutaneously daily. - Increase Ozempic  to 0.5 mg subcutaneously weekly after completing the current pen. - Monitor blood glucose levels regularly. - Recheck A1c in six weeks.

## 2023-08-02 NOTE — Assessment & Plan Note (Signed)
 Active surgical wound requires glycemic control for healing. Elevated glucose exacerbates wound. Emphasized urgency in glucose control to aid healing and prevent recurrence. - Increase Lantus  and Ozempic  to improve glycemic control and support wound healing. - Continue taking vitamin C to aid in healing.

## 2023-08-02 NOTE — Patient Instructions (Signed)
  VISIT SUMMARY: During your visit, we discussed your diabetes management, blood pressure, sleep apnea, and other health concerns. You have been making positive dietary changes and have lost six pounds since June. We reviewed your current medications and made some adjustments to better control your blood sugar levels and support wound healing.  YOUR PLAN: -TYPE 2 DIABETES MELLITUS WITH VASCULAR DISEASE: Type 2 diabetes is a condition where your body does not use insulin  properly, leading to high blood sugar levels. We have increased your Lantus  to 20 units daily and Ozempic  to 0.5 mg weekly after you finish your current pen. Please monitor your blood sugar levels regularly and we will recheck your A1c in six weeks.  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 It is important to keep your blood sugar under control to help your wound heal and prevent recurrence. Continue taking vitamin C and follow the adjusted diabetes medication plan.  -HYPERTENSION: Hypertension is high blood pressure. Your blood pressure is well-controlled with your current medication. Please continue your current regimen and monitor your blood pressure at home regularly.  -OBSTRUCTIVE SLEEP APNEA: Obstructive sleep apnea is a condition where your breathing stops and starts during sleep. It is important to use your CPAP machine consistently to improve your sleep quality and reduce fatigue.  -LOW TESTOSTERONE : Low testosterone  can cause fatigue and loss of muscle mass. You are on testosterone  cypionate and we will check your levels at the midpoint of your dosing cycle during your next visit in August.  -GENERAL HEALTH MAINTENANCE: You have made positive lifestyle changes, including dietary modifications and weight loss. Continue these changes to support your weight loss and blood sugar control.  INSTRUCTIONS: Please schedule a follow-up appointment in six  weeks to recheck your A1c and assess your diabetes management. We will also check your testosterone  levels at your next visit in August.

## 2023-08-02 NOTE — Progress Notes (Signed)
 Established Patient Office Visit  Subjective   Patient ID: Nathan [name], male    DOB: 1975/03/18  Age: 48 y.o. MRN: [id number]  Chief Complaint  Patient presents with   Medical Management of Chronic Issues    4 week follow up for diabetes   Patient is interested in Tdap Vaccine, Waiting for Opthalmology to send Records of eye exam, patient has not had a colonoscopy completed.     HPI  Discussed the use of AI scribe software for clinical note transcription with the patient, who gave verbal consent to proceed.  History of Present Illness Nathan [name] is a 48 year old male with diabetes who presents for follow-up on his diabetes management.  He has been making dietary changes, reducing intake of ice cream, pasta, cereal, and oatmeal, which he feels are positively impacting his diabetes management. His current medication regimen includes Lantus  15 units daily, Ozempic  0.25 mg once a week, and Jardiance . He has not experienced any side effects from Ozempic  and is due to take his third dose today.  His blood sugar levels at home have been variable, with fasting levels around 170 mg/dL, a low of 861 mg/dL, and a high of 782 mg/dL. He attributes some of the higher readings to pain and lack of sleep. He is concerned about an active surgical wound that is healing and fears recurrence due to his diabetes. He is taking vitamin C to aid in healing and has developed a pressure spot on his elbow due to positioning, along with back spasms and left hip pain from sitting.  He has a history of sleep apnea and uses a CPAP machine, although he sometimes removes it during sleep. He experiences significant sleep disturbances, often sleeping only 3-4 hours every other day, which he attributes to discomfort from his wound and positioning issues.  He has been trying to lose weight and has lost six pounds since June. He has been cutting back on sugar-free sodas and sodium intake. His blood  pressure has improved with new medication, with recent home readings around 130/78 mmHg.  No dizziness, nausea, vomiting, abdominal pain, constipation, diarrhea, or leg swelling. Reports back spasms and left hip pain.     Objective:     BP (!) 143/99   Pulse 91   Wt 203 lb (92.1 kg)   BMI 28.31 kg/m    Physical Exam  Physical Exam Gen: Well-appearing man CHEST: Lungs clear to auscultation, no crackles. CARDIOVASCULAR: Heart regular rate and rhythm, no murmur. ABDOMEN: Abdomen non-tender. EXTREMITIES: No edema in the legs.     Assessment & Plan:   Problem List Items Addressed This Visit       High   HTN (hypertension) (Chronic)   Blood pressure well-controlled with current regimen. Home readings around 130/78 mmHg, office reading 143/99 mmHg. No lightheadedness on standing. - Continue current antihypertensive medication regimen. - Monitor blood pressure at home regularly.      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 Blood glucose levels variable, fasting around 170 mg/dL, highs up to 782 mg/dL. Active wound requires glucose under 180 mg/dL for healing. Recommended Lantus  increase to 20 units daily and Ozempic  to 0.5 mg weekly post current pen. Discussed Ozempic  side effects and importance of consistent medication and sleep schedules. - Increase Lantus  to 20 units subcutaneously daily. - Increase Ozempic  to 0.5 mg subcutaneously weekly after completing the current pen. -  Monitor blood glucose levels regularly. - Recheck A1c in six weeks.      Relevant Medications   Semaglutide ,0.25 or 0.5MG /DOS, 2 MG/3ML SOPN   LANTUS  SOLOSTAR 100 UNIT/ML Solostar Pen   Perianal fistula (Chronic)   Active surgical wound requires glycemic control for healing. Elevated glucose exacerbates wound. Emphasized urgency in glucose control to aid healing and prevent recurrence. - Increase Lantus  and  Ozempic  to improve glycemic control and support wound healing. - Continue taking vitamin C to aid in healing.        Medium    Low testosterone  (Chronic)   On testosterone  cypionate 200 mg/mL intramuscular every 14 days. Plans to check levels at midpoint of dosing cycle. Low testosterone  may contribute to fatigue and muscle mass loss. - Schedule testosterone  level check at the midpoint of the dosing cycle during the next visit in August.       Return in 7 weeks (on 09/22/2023) for testosterone  check and diabetes management.    Cleatus Debby Specking, MD

## 2023-08-02 NOTE — Assessment & Plan Note (Signed)
 On testosterone  cypionate 200 mg/mL intramuscular every 14 days. Plans to check levels at midpoint of dosing cycle. Low testosterone  may contribute to fatigue and muscle mass loss. - Schedule testosterone  level check at the midpoint of the dosing cycle during the next visit in August.

## 2023-08-05 ENCOUNTER — Other Ambulatory Visit: Payer: Self-pay

## 2023-08-05 ENCOUNTER — Emergency Department (HOSPITAL_COMMUNITY)
Admission: EM | Admit: 2023-08-05 | Discharge: 2023-08-06 | Disposition: A | Discharge status: Home / Self Care | Attending: Emergency Medicine | Admitting: Emergency Medicine

## 2023-08-05 ENCOUNTER — Encounter (HOSPITAL_COMMUNITY): Payer: Self-pay

## 2023-08-05 DIAGNOSIS — Z794 Long term (current) use of insulin: Secondary | ICD-10-CM | POA: Diagnosis not present

## 2023-08-05 DIAGNOSIS — R200 Anesthesia of skin: Secondary | ICD-10-CM | POA: Insufficient documentation

## 2023-08-05 DIAGNOSIS — R42 Dizziness and giddiness: Secondary | ICD-10-CM | POA: Diagnosis present

## 2023-08-05 DIAGNOSIS — E039 Hypothyroidism, unspecified: Secondary | ICD-10-CM | POA: Insufficient documentation

## 2023-08-05 DIAGNOSIS — E119 Type 2 diabetes mellitus without complications: Secondary | ICD-10-CM | POA: Insufficient documentation

## 2023-08-05 DIAGNOSIS — I10 Essential (primary) hypertension: Secondary | ICD-10-CM | POA: Insufficient documentation

## 2023-08-05 DIAGNOSIS — R197 Diarrhea, unspecified: Secondary | ICD-10-CM | POA: Diagnosis not present

## 2023-08-05 DIAGNOSIS — Z79890 Hormone replacement therapy (postmenopausal): Secondary | ICD-10-CM | POA: Insufficient documentation

## 2023-08-05 DIAGNOSIS — Z79899 Other long term (current) drug therapy: Secondary | ICD-10-CM | POA: Insufficient documentation

## 2023-08-05 DIAGNOSIS — R55 Syncope and collapse: Secondary | ICD-10-CM | POA: Diagnosis not present

## 2023-08-05 LAB — CBC
HCT: 47.4 % (ref 39.0–52.0)
Hemoglobin: 16.4 g/dL (ref 13.0–17.0)
MCH: 30.7 pg (ref 26.0–34.0)
MCHC: 34.6 g/dL (ref 30.0–36.0)
MCV: 88.6 fL (ref 80.0–100.0)
Platelets: 188 K/uL (ref 150–400)
RBC: 5.35 MIL/uL (ref 4.22–5.81)
RDW: 12.1 % (ref 11.5–15.5)
WBC: 7.1 K/uL (ref 4.0–10.5)
nRBC: 0 % (ref 0.0–0.2)

## 2023-08-05 LAB — COMPREHENSIVE METABOLIC PANEL WITH GFR
ALT: 36 U/L (ref 0–44)
AST: 26 U/L (ref 15–41)
Albumin: 3.9 g/dL (ref 3.5–5.0)
Alkaline Phosphatase: 91 U/L (ref 38–126)
Anion gap: 16 — ABNORMAL HIGH (ref 5–15)
BUN: 16 mg/dL (ref 6–20)
CO2: 21 mmol/L — ABNORMAL LOW (ref 22–32)
Calcium: 9.1 mg/dL (ref 8.9–10.3)
Chloride: 100 mmol/L (ref 98–111)
Creatinine, Ser: 0.91 mg/dL (ref 0.61–1.24)
GFR, Estimated: >60 mL/min (ref >60)
Glucose, Bld: 161 mg/dL — ABNORMAL HIGH (ref 70–99)
Potassium: 3.4 mmol/L — ABNORMAL LOW (ref 3.5–5.1)
Sodium: 137 mmol/L (ref 135–145)
Total Bilirubin: 1.7 mg/dL — ABNORMAL HIGH (ref 0.0–1.2)
Total Protein: 7.1 g/dL (ref 6.5–8.1)

## 2023-08-05 LAB — CBG MONITORING, ED: Glucose-Capillary: 141 mg/dL — ABNORMAL HIGH (ref 70–99)

## 2023-08-05 LAB — BETA-HYDROXYBUTYRIC ACID: Beta-Hydroxybutyric Acid: 3.79 mmol/L — ABNORMAL HIGH (ref 0.05–0.27)

## 2023-08-05 MED ORDER — LACTATED RINGERS IV BOLUS
1000.0000 mL | Freq: Once | INTRAVENOUS | Status: AC
  Administered 2023-08-06: 1000 mL via INTRAVENOUS

## 2023-08-05 MED ORDER — LACTATED RINGERS IV BOLUS
1000.0000 mL | Freq: Once | INTRAVENOUS | Status: AC
Start: 2023-08-05 — End: 2023-08-06
  Administered 2023-08-05: 1000 mL via INTRAVENOUS

## 2023-08-05 NOTE — ED Provider Notes (Signed)
 [place] EMERGENCY DEPARTMENT AT [facility] Provider Note   CSN: [id number] Arrival date & time: 08/05/23  8094     Patient presents with: Near Syncope   Nathan [name] is a 48 y.o. male with a history including hypertension, hypothyroidism, type 2 diabetes and hyperlipidemia, who is currently recovering from a perianal fistula surgery from May, woke from sleep this evening with an urgency to have a bowel movement.  He had a large diarrheal bowel movement and while sitting on the toilet had a near syncopal event.  He did not fall on the floor, stating was held up by the shower door.  He also recalls having nausea without emesis.  He denies any pain, but does endorse has had 2 further episodes of feeling lightheaded with positional changes.  EMS was called and received IV fluids and route.  He denies palpitations, chest pain, shortness of breath.  His blood glucose levels have been well-controlled in the mid 100s.  He does endorse increased thirst but also increased urination.  Wife at bedside mentions he has had some numbness in his right ring and pinky finger along with a cool feeling on the right side of his neck, symptoms worsened with positional changes, denies neck injury and states this symptom has been present for months, not new or worsened today.  He denies headache, neck stiffness.  No known neck injury.  {Add pertinent medical, surgical, social history, OB history to YEP:67052} The history is provided by the patient and the spouse.       Prior to Admission medications   Medication Sig Start Date End Date Taking? Authorizing Provider  amLODipine -valsartan  (EXFORGE ) 5-160 MG tablet Take 1 tablet by mouth daily. 07/05/23   Jerrell Cleatus Ned, MD  B-D 3CC LUER-LOK SYR 23GX1 23G X 1 3 ML MISC every 14 (fourteen) days. 07/06/23   [provider]  Blood Glucose Monitoring Suppl DEVI 1 each by Does not apply route 3 (three) times daily. May dispense any  manufacturer covered by patient's insurance. 07/05/23   Jerrell Cleatus Ned, MD  empagliflozin  (JARDIANCE ) 25 MG TABS tablet Take 1 tablet (25 mg total) by mouth daily before breakfast. 07/05/23   Jerrell Cleatus Ned, MD  Glucagon  (GVOKE HYPOPEN  2-PACK) 1 MG/0.2ML SOAJ Inject 1 mg into the skin as needed for up to 2 doses (Severe low blood sugar). 07/05/23   Jerrell Cleatus Ned, MD  Glucose Blood (BLOOD GLUCOSE TEST STRIPS) STRP 1 each by Does not apply route 3 (three) times daily. Use as directed to check blood sugar. May dispense any manufacturer covered by patient's insurance and fits patient's device. 07/05/23   Jerrell Cleatus Ned, MD  Insulin  Pen Needle (PEN NEEDLES) 31G X 5 MM MISC 1 each by Does not apply route 3 (three) times daily. May dispense any manufacturer covered by patient's insurance. 07/05/23   Jerrell Cleatus Ned, MD  Lancet Device MISC 1 each by Does not apply route 3 (three) times daily. May dispense any manufacturer covered by patient's insurance. 07/05/23   Jerrell Cleatus Ned, MD  Lancets MISC 1 each by Does not apply route 3 (three) times daily. Use as directed to check blood sugar. May dispense any manufacturer covered by patient's insurance and fits patient's device. 07/05/23   Jerrell Cleatus Ned, MD  LANTUS  SOLOSTAR 100 UNIT/ML Solostar Pen Inject 20 Units into the skin daily. Dose change 08/02/23   Jerrell Cleatus Ned, MD  levothyroxine  (SYNTHROID ) 88 MCG tablet Take 1 tablet (88 mcg  total) by mouth daily before breakfast. 07/05/23   Jerrell Cleatus Ned, MD  rosuvastatin  (CRESTOR ) 10 MG tablet Take 1 tablet (10 mg total) by mouth daily. 07/05/23   Jerrell Cleatus Ned, MD  Semaglutide ,0.25 or 0.5MG /DOS, 2 MG/3ML SOPN Inject 0.5 mg into the skin once a week. 08/02/23   Jerrell Cleatus Ned, MD  testosterone  cypionate (DEPOTESTOSTERONE CYPIONATE) 200 MG/ML injection Inject 1 mL (200 mg total) into the muscle every 14 (fourteen) days. 07/05/23   Jerrell Cleatus Ned, MD    Allergies: Benzocaine, Lidocaine , Orange juice monetta oil], Flavoring agent (non-screening), and Grapefruit concentrate    Review of Systems  Constitutional:  Negative for chills and fever.  HENT:  Negative for congestion and sore throat.   Eyes: Negative.   Respiratory:  Negative for chest tightness and shortness of breath.   Cardiovascular:  Negative for chest pain.  Gastrointestinal:  Positive for diarrhea and nausea. Negative for abdominal pain and vomiting.  Genitourinary: Negative.   Musculoskeletal:  Negative for arthralgias, joint swelling and neck pain.  Skin: Negative.  Negative for rash and wound.  Neurological:  Positive for light-headedness and numbness. Negative for dizziness, weakness and headaches.  Psychiatric/Behavioral: Negative.      Updated Vital Signs BP (!) 140/89 (BP Location: Left Arm)   Pulse 99   Temp 98.2 F (36.8 C) (Oral)   Resp 15   Ht 5' 11 (1.803 m)   Wt 91.6 kg   SpO2 97%   BMI 28.17 kg/m   Physical Exam Vitals and nursing note reviewed.  Constitutional:      Appearance: He is well-developed.  HENT:     Head: Normocephalic and atraumatic.  Eyes:     Conjunctiva/sclera: Conjunctivae normal.  Cardiovascular:     Rate and Rhythm: Normal rate and regular rhythm.     Heart sounds: Normal heart sounds.  Pulmonary:     Effort: Pulmonary effort is normal.     Breath sounds: Normal breath sounds. No wheezing.  Abdominal:     General: Bowel sounds are normal.     Palpations: Abdomen is soft.     Tenderness: There is no abdominal tenderness. There is no guarding.  Musculoskeletal:        General: Normal range of motion.     Cervical back: Normal range of motion.  Skin:    General: Skin is warm and dry.  Neurological:     General: No focal deficit present.     Mental Status: He is alert and oriented to person, place, and time.     Cranial Nerves: Cranial nerves 2-12 are intact.     Sensory: Sensory deficit present.      Motor: No weakness.     Comments: Equal grip strength.  Decree sensation to fine touch in right ring and pinky fingers.  Long finger is not affected.     (all labs ordered are listed, but only abnormal results are displayed) Labs Reviewed  COMPREHENSIVE METABOLIC PANEL WITH GFR - Abnormal; Notable for the following components:      Result Value   Potassium 3.4 (*)    CO2 21 (*)    Glucose, Bld 161 (*)    Total Bilirubin 1.7 (*)    Anion gap 16 (*)    All other components within normal limits  CBG MONITORING, ED - Abnormal; Notable for the following components:   Glucose-Capillary 141 (*)    All other components within normal limits  CBC  URINALYSIS, ROUTINE W REFLEX MICROSCOPIC  BETA-HYDROXYBUTYRIC ACID    EKG: EKG Interpretation Date/Time:  Saturday August 05 2023 20:20:28 EDT Ventricular Rate:  95 PR Interval:  140 QRS Duration:  95 QT Interval:  360 QTC Calculation: 453 R Axis:   17  Text Interpretation: Sinus rhythm Borderline T wave abnormalities Confirmed by Cleotilde Rogue ([id number]) on 08/05/2023 9:56:58 PM  Radiology: No results found.  {Document cardiac monitor, telemetry assessment procedure when appropriate:32947} Procedures   Medications Ordered in the ED  lactated ringers  bolus 1,000 mL (1,000 mLs Intravenous New Bag/Given 08/05/23 2209)      {Click here for ABCD2, HEART and other calculators REFRESH Note before signing:1}                              Medical Decision Making Amount and/or Complexity of Data Reviewed Labs: ordered.     {Document critical care time when appropriate  Document review of labs and clinical decision tools ie CHADS2VASC2, etc  Document your independent review of radiology images and any outside records  Document your discussion with family members, caretakers and with consultants  Document social determinants of health affecting pt's care  Document your decision making why or why not admission, treatments were  needed:32947:::1}   Final diagnoses:  None    ED Discharge Orders     None

## 2023-08-05 NOTE — ED Triage Notes (Signed)
 PER EMS: pt is from home with c/o witnessed syncopal episode when sitting on the toilet today. + diarrhea, did not vagal. He did not fall onto the floor. No head injury, no blood thinners. Denies any pain but does report numbness to his neck and down right and left  arms and it feels cold. A&OX4.  BP- 120/80, HR-80s, 12 lead unremarkable. O2-100%, CBG-140

## 2023-08-06 LAB — URINALYSIS, ROUTINE W REFLEX MICROSCOPIC
Bacteria, UA: NONE SEEN
Bilirubin Urine: NEGATIVE
Glucose, UA: >=500 mg/dL — AB
Hgb urine dipstick: NEGATIVE
Ketones, ur: 80 mg/dL — AB
Leukocytes,Ua: NEGATIVE
Nitrite: NEGATIVE
Protein, ur: NEGATIVE mg/dL
Specific Gravity, Urine: 1.038 — ABNORMAL HIGH (ref 1.005–1.030)
pH: 5 (ref 5.0–8.0)

## 2023-08-06 NOTE — ED Notes (Signed)
Dr Pilar Plate at bedside.

## 2023-08-06 NOTE — ED Notes (Signed)
 ..  The patient is A&OX4, ambulatory at d/c with independent steady gait, NAD. Pt verbalized understanding of d/c instructions and follow up care.

## 2023-08-06 NOTE — Discharge Instructions (Signed)
 You were evaluated in the Emergency Department and after careful evaluation, we did not find any emergent condition requiring admission or further testing in the hospital.  Your exam/testing today is overall reassuring.  Continue hydration at home.  Please return to the Emergency Department if you experience any worsening of your condition.   Thank you for allowing us  to be a part of your care.

## 2023-08-06 NOTE — ED Provider Notes (Signed)
  Provider Note MRN:  [id number]  Arrival date & time: 08/06/23    ED Course and Medical Decision Making  Assumed care of patient at sign-out or upon transfer.  Near syncope after bowel movement, dehydration suspected reassess after fluids.  1:30 AM update: Patient is resting comfortably no acute distress with normal vital signs, feels a lot better, suspect the ketones are elevated in the setting of new diet recently while on Ozempic , highly doubt DKA at this time.  Appropriate for discharge.  Procedures  Final Clinical Impressions(s) / ED Diagnoses     ICD-10-CM   1. Near syncope  R55       ED Discharge Orders     None         Discharge Instructions      You were evaluated in the Emergency Department and after careful evaluation, we did not find any emergent condition requiring admission or further testing in the hospital.  Your exam/testing today is overall reassuring.  Continue hydration at home.  Please return to the Emergency Department if you experience any worsening of your condition.   Thank you for allowing us  to be a part of your care.      Ozell HERO. Theadore, MD [facility] Emergency Medicine [facility] [email or web address]    Theadore Ozell HERO, MD 08/06/23 [phone]

## 2023-08-16 ENCOUNTER — Ambulatory Visit (INDEPENDENT_AMBULATORY_CARE_PROVIDER_SITE_OTHER): Admitting: General Surgery

## 2023-08-16 ENCOUNTER — Telehealth: Payer: Self-pay | Admitting: Student in an Organized Health Care Education/Training Program

## 2023-08-16 VITALS — BP 154/105 | HR 84 | Temp 98.1°F | Resp 16 | Ht 71.0 in | Wt 201.0 lb

## 2023-08-16 DIAGNOSIS — K603 Anal fistula, unspecified: Secondary | ICD-10-CM

## 2023-08-16 NOTE — Telephone Encounter (Signed)
 Patient is scheduled for hospital f/u for Monday 08/21/23 9am

## 2023-08-16 NOTE — Telephone Encounter (Signed)
 Copied from CRM #[id number]. Topic: Clinical - Medical Advice >> Aug 16, 2023 12:57 PM Aleatha C wrote:   Reason for CRM: Patient went to the hospital about 2 weeks ago because his blood pressure was extremely low and he passed out so he stopped taking his blood pressure pills, and now his blood pressure is very high last reading was 150/150 and he wants to know how to regulate it should he take the pills or maybe take them not everyday, please give a call [phone]

## 2023-08-16 NOTE — Patient Instructions (Signed)
 Peroxide on small amount of gauze, then can transition to dry gauze. Try to keep area more dry. Call with issues.

## 2023-08-16 NOTE — Progress Notes (Signed)
 [facility]  No complaints. Wound packing going well.  BP (!) 154/105   Pulse 84   Temp 98.1 F (36.7 C) (Oral)   Resp 16   Ht 5' 11 (1.803 m)   Wt 201 lb (91.2 kg)   SpO2 98%   BMI 28.03 kg/m  Healing fistulectomy site. Granulating and superficial. Some moisture in area  Patient s/p fistulectomy. Doing well.  Peroxide on small amount of gauze, then can transition to dry gauze. Try to keep area more dry. Call with issues.   Future Appointments  Date Time Provider Department Center  09/06/2023 10:15 AM Kallie Manuelita BROCKS, MD [facility] None  09/21/2023  1:00 PM Jerrell Cleatus Ned, MD [facility]   Manuelita Kallie, MD [facility] [address] [place], [place] [address] [phone] (office)

## 2023-08-21 ENCOUNTER — Encounter: Payer: Self-pay | Admitting: Student in an Organized Health Care Education/Training Program

## 2023-08-21 ENCOUNTER — Ambulatory Visit (INDEPENDENT_AMBULATORY_CARE_PROVIDER_SITE_OTHER): Admitting: Student in an Organized Health Care Education/Training Program

## 2023-08-21 VITALS — BP 140/97 | HR 109 | Wt 193.0 lb

## 2023-08-21 DIAGNOSIS — I10 Essential (primary) hypertension: Secondary | ICD-10-CM

## 2023-08-21 DIAGNOSIS — Z7985 Long-term (current) use of injectable non-insulin antidiabetic drugs: Secondary | ICD-10-CM

## 2023-08-21 DIAGNOSIS — E1159 Type 2 diabetes mellitus with other circulatory complications: Secondary | ICD-10-CM

## 2023-08-21 DIAGNOSIS — R55 Syncope and collapse: Secondary | ICD-10-CM | POA: Insufficient documentation

## 2023-08-21 MED ORDER — OZEMPIC (0.25 OR 0.5 MG/DOSE) 2 MG/3ML ~~LOC~~ SOPN: 0.5000 mg | PEN_INJECTOR | SUBCUTANEOUS | 1 refills | Status: DC

## 2023-08-21 MED ORDER — VALSARTAN 160 MG PO TABS: 160.0000 mg | ORAL_TABLET | Freq: Every day | ORAL | 3 refills | Status: DC

## 2023-08-21 NOTE — Progress Notes (Signed)
 Established Patient Office Visit  Subjective   Patient ID: Nathan [name], male    DOB: [birth year]  Age: 48 y.o. MRN: [id number]  Chief Complaint  Patient presents with   Hospitalization Follow-up    HPI  Discussed the use of AI scribe software for clinical note transcription with the patient, who gave verbal consent to proceed.  History of Present Illness Nathan [name] is a 48 year old male with diabetes who presents with episodes of syncope and hypotension. He is accompanied by his wife.  He experienced a syncopal episode during sleep, leading to a significant drop in blood pressure and fainting while attempting to use the bathroom. He woke up feeling dizzy and weak, and his mother assisted him after he called out for help. His blood pressure was initially recorded at 90/64 mmHg, and his blood sugar was 164 mg/dL post-dinner.  Two days later, he felt faint while playing golf with his son-in-law. His blood pressure was 97/64 mmHg. This was his first time being physically active since his surgery, and he felt exhausted after the activity.  He has a history of diabetes and was on Ozempic  at 0.25 mg, which was paused due to concerns about its role in his symptoms. He has not increased to the 0.5 mg dose. His blood sugar readings at home average 142-150 mg/dL in the mornings, with no readings over 200 mg/dL.  He was on EXforge , a combination of amlodipine  and valsartan , for blood pressure management. He received two bags of fluid in the ER during his initial episode.  He reports significant weight loss from 209 pounds in June to 193 pounds currently, attributing this to low testosterone , starting empiric and recent illness. He experiences muscle weakness but feels strong despite this and has been gradually resuming activities such as driving and yard work.  No frequent nausea, vomiting, or diarrhea with Ozempic  use. He feels weak but does not experience frequent episodes of  low blood sugar.      Objective:     BP (!) 140/97   Pulse (!) 109   Wt 193 lb (87.5 kg)   SpO2 99%   BMI 26.92 kg/m   Physical Exam  Gen: Well-appearing man Mouth: Moist mucous membranes Heart: Mildly tachycardic, regular Lungs: Unlabored, clear throughout Abd; soft, nontender Ext: Warm, no edema    Assessment & Plan:    Problem List Items Addressed This Visit       High   HTN (hypertension) (Chronic)   Blood pressure is 140/97 mmHg. Xforge may exacerbate syncope, while valsartan  supports renal function in diabetes. Discontinue Xforge and start valsartan  monotherapy. Monitor blood pressure and adjust valsartan  dosage as needed.      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 Ozempic  was paused due to syncope; resumption is needed for glycemic control. Resume Ozempic  at 0.25 mg weekly and increase to 0.5 mg if tolerated. Monitor blood glucose regularly.      Relevant Medications   valsartan  (DIOVAN ) 160 MG tablet   Semaglutide ,0.25 or 0.5MG /DOS, (OZEMPIC , 0.25 OR 0.5 MG/DOSE,) 2 MG/3ML SOPN     Unprioritized   Vasovagal syncope   Episodes are linked to reflex-mediated hypotension during bowel movements and post-exertion, with dehydration and physical activity as contributing factors. Advise supine position with elevated legs during episodes and encourage hydration, especially during exertion.  Cleatus Debby Specking, MD

## 2023-08-21 NOTE — Assessment & Plan Note (Signed)
 Blood pressure is 140/97 mmHg. Xforge may exacerbate syncope, while valsartan  supports renal function in diabetes. Discontinue Xforge and start valsartan  monotherapy. Monitor blood pressure and adjust valsartan  dosage as needed.

## 2023-08-21 NOTE — Assessment & Plan Note (Signed)
 Episodes are linked to reflex-mediated hypotension during bowel movements and post-exertion, with dehydration and physical activity as contributing factors. Advise supine position with elevated legs during episodes and encourage hydration, especially during exertion.

## 2023-08-21 NOTE — Patient Instructions (Signed)
  VISIT SUMMARY: During your visit, we discussed your recent episodes of fainting and low blood pressure, your diabetes management, and other health concerns. We reviewed your medications and made some adjustments to better manage your conditions.  YOUR PLAN: -VASOVAGAL SYNCOPE: Vasovagal syncope is a condition where you faint because your body overreacts to certain triggers, such as seeing blood or extreme emotional distress. In your case, it seems to be related to low blood pressure during bowel movements and after physical activity. To manage this, you should lie down with your legs elevated during episodes and stay well-hydrated, especially when active.  -HYPERTENSION: Hypertension, or high blood pressure, can increase your risk of heart disease and stroke. Your current blood pressure is 140/97 mmHg. We are discontinuing Xforge and starting you on valsartan  alone to help manage your blood pressure without causing fainting. Please monitor your blood pressure regularly and we will adjust the dosage as needed.  -TYPE 2 DIABETES MELLITUS: Type 2 diabetes is a condition where your body does not use insulin  properly, leading to high blood sugar levels. Your morning glucose levels are between 142-150 mg/dL. We paused your Ozempic  due to fainting episodes, but you need to resume it at 0.25 mg weekly and increase to 0.5 mg if tolerated to help control your blood sugar. Please monitor your blood glucose regularly.  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 You are recovering well from surgery, with primary muscle healing observed. Please follow up with your surgeon in three weeks to ensure proper healing.  -LOW TESTOSTERONE : Low testosterone  can cause symptoms like weight loss and muscle weakness. We will manage this with testosterone  cypionate injections to help improve your symptoms.  -GENERAL HEALTH MAINTENANCE: To improve your  overall health, we recommend regular physical activity to regain strength and muscle mass. Make sure to stay hydrated, especially during physical activities.  INSTRUCTIONS: Please schedule a follow-up appointment in August to reassess your treatment and progress. Continue to monitor your blood pressure and blood glucose levels regularly. Follow up with your surgeon in three weeks regarding your perianal fistula.

## 2023-08-21 NOTE — Assessment & Plan Note (Signed)
 Morning glucose levels are 142-150 mg/dL. Ozempic  was paused due to syncope; resumption is needed for glycemic control. Resume Ozempic  at 0.25 mg weekly and increase to 0.5 mg if tolerated. Monitor blood glucose regularly.

## 2023-08-23 ENCOUNTER — Other Ambulatory Visit: Payer: Self-pay | Admitting: Student in an Organized Health Care Education/Training Program

## 2023-08-23 DIAGNOSIS — R7989 Other specified abnormal findings of blood chemistry: Secondary | ICD-10-CM

## 2023-08-23 NOTE — Telephone Encounter (Signed)
 Requested Prescriptions   Pending Prescriptions Disp Refills   testosterone  cypionate (DEPOTESTOSTERONE CYPIONATE) 200 MG/ML injection [Pharmacy Med Name: Testosterone  Cypionate 200 MG/ML Intramuscular Solution] 2 mL 0    Sig: INJECT 1 ML  INTRAMUSCULARLY EVERY 14 DAYS   B-D 3CC LUER-LOK SYR 23GX1 23G X 1 3 ML MISC [Pharmacy Med Name: LL SYRNG 23GX1 MIS] 2 each 0    Sig: USE AS DIRECTED WITH TESTOSTERONE  INJECTION EVERY 14 DAYS.     Date of patient request: 08/23/23 Last office visit: 08/21/2023 Upcoming visit: 09/21/2023 Date of last refill: 07/05/23 Last refill amount: 28 day supply

## 2023-08-24 ENCOUNTER — Telehealth: Payer: Self-pay | Admitting: Student in an Organized Health Care Education/Training Program

## 2023-08-24 MED ORDER — DEXCOM G7 SENSOR MISC: 1.0000 | 2 refills | Status: DC

## 2023-08-24 MED ORDER — DEXCOM G7 RECEIVER DEVI: 1.0000 | 0 refills | Status: DC

## 2023-08-24 NOTE — Telephone Encounter (Unsigned)
 Copied from CRM [id number]. Topic: Clinical - Medication Question >> Aug 24, 2023  2:24 PM Gennette ORN wrote: Reason for CRM: Patient's wife Elenor [phone] wants to know can he prescribed they already discuss the medication with dr.

## 2023-08-24 NOTE — Telephone Encounter (Signed)
Dexcom ordered

## 2023-08-24 NOTE — Telephone Encounter (Signed)
 Patient is needing the Dexcom G7 sent to pharmacy [facility]). Please advise.

## 2023-09-06 ENCOUNTER — Ambulatory Visit (INDEPENDENT_AMBULATORY_CARE_PROVIDER_SITE_OTHER): Admitting: General Surgery

## 2023-09-06 ENCOUNTER — Other Ambulatory Visit: Payer: Self-pay

## 2023-09-06 ENCOUNTER — Encounter: Payer: Self-pay | Admitting: General Surgery

## 2023-09-06 VITALS — BP 151/94 | HR 92 | Temp 98.2°F | Resp 14 | Ht 71.0 in | Wt 188.4 lb

## 2023-09-06 DIAGNOSIS — K603 Anal fistula, unspecified: Secondary | ICD-10-CM

## 2023-09-06 NOTE — Patient Instructions (Signed)
 Continue activity as tolerated. Talk with PCP if continue to have issues with dehydration/ insomnia.

## 2023-09-06 NOTE — Progress Notes (Signed)
 [facility]   Doing well. No major issues. Healing. Still with some pain at this coccyx.   Says he is not sleeping great and has been dehydrated. His wife is worried it could be from the ozempic .   BP (!) 151/94 (BP Location: Right Arm, Patient Position: Sitting, Cuff Size: Normal)   Pulse 92   Temp 98.2 F (36.8 C) (Oral)   Resp 14   Ht 5' 11 (1.803 m)   Wt 188 lb 6.4 oz (85.5 kg)   SpO2 98%   BMI 26.28 kg/m  Healing perianal fistulectomy site, < 2mm granulated area remaining   Patient s/p fistulectomy. Doing well.   Continue activity as tolerated. Talk with PCP if continue to have issues with dehydration/ insomnia.  Follow up PRN.  Manuelita Pander, MD [facility] [address] [place], [place] [address] [phone] (office)

## 2023-09-15 ENCOUNTER — Other Ambulatory Visit (HOSPITAL_COMMUNITY): Payer: Self-pay

## 2023-09-15 ENCOUNTER — Telehealth: Payer: Self-pay

## 2023-09-15 NOTE — Telephone Encounter (Signed)
 Pharmacy Patient Advocate Encounter   Received notification from Onbase that prior authorization for Dexcom G7 Sensor is required/requested.   Insurance verification completed.   The patient is insured through [facility] .   Per test claim: PA required; PA submitted to above mentioned insurance via Latent Key/confirmation #/EOC [id number] Status is pending

## 2023-09-15 NOTE — Telephone Encounter (Signed)
 Pharmacy Patient Advocate Encounter  Received notification from [employer] that Prior Authorization for Dexcom G7 Sensor  has been APPROVED from 09/15/23 to 03/17/24   PA #/Case ID/Reference #: [id number]

## 2023-09-21 ENCOUNTER — Ambulatory Visit: Admitting: Student in an Organized Health Care Education/Training Program

## 2023-09-22 ENCOUNTER — Encounter: Payer: Self-pay | Admitting: Student in an Organized Health Care Education/Training Program

## 2023-09-22 ENCOUNTER — Ambulatory Visit (INDEPENDENT_AMBULATORY_CARE_PROVIDER_SITE_OTHER): Admitting: Student in an Organized Health Care Education/Training Program

## 2023-09-22 ENCOUNTER — Ambulatory Visit: Payer: Self-pay | Admitting: Student in an Organized Health Care Education/Training Program

## 2023-09-22 VITALS — BP 103/70 | HR 113 | Wt 183.0 lb

## 2023-09-22 DIAGNOSIS — E291 Testicular hypofunction: Secondary | ICD-10-CM | POA: Diagnosis not present

## 2023-09-22 DIAGNOSIS — I10 Essential (primary) hypertension: Secondary | ICD-10-CM | POA: Diagnosis not present

## 2023-09-22 DIAGNOSIS — E1159 Type 2 diabetes mellitus with other circulatory complications: Secondary | ICD-10-CM | POA: Diagnosis not present

## 2023-09-22 DIAGNOSIS — Z7985 Long-term (current) use of injectable non-insulin antidiabetic drugs: Secondary | ICD-10-CM

## 2023-09-22 DIAGNOSIS — E039 Hypothyroidism, unspecified: Secondary | ICD-10-CM | POA: Diagnosis not present

## 2023-09-22 LAB — TSH: TSH: 3.62 u[IU]/mL (ref 0.35–5.50)

## 2023-09-22 LAB — POCT GLYCOSYLATED HEMOGLOBIN (HGB A1C): Hemoglobin A1C: 6.9 % — AB (ref 4.0–5.6)

## 2023-09-22 NOTE — Assessment & Plan Note (Signed)
 Weight loss may necessitate a levothyroxine  dose adjustment, which could also contribute to tachycardia if overmedicated. Check TSH to assess the need for levothyroxine  dose adjustment.

## 2023-09-22 NOTE — Assessment & Plan Note (Signed)
 Recurrent orthostatic hypotension is likely due to weight loss and overmedication with antihypertensives, as valsartan  reduction was insufficient. Discontinue valsartan  and monitor blood pressure regularly. Advised against activities that may provoke hypotension until stable.

## 2023-09-22 NOTE — Patient Instructions (Signed)
  VISIT SUMMARY: During your visit, we discussed your persistent low blood pressure, high heart rate, and overall health management. We reviewed your current medications, recent weight loss, and diabetes control. Adjustments were made to your treatment plan to address these issues and improve your overall well-being.  YOUR PLAN: -HYPOTENSION WITH RECURRENT LIGHTHEADEDNESS AND DIZZINESS: Your low blood pressure and dizziness are likely due to weight loss and overmedication with blood pressure medicine. We have discontinued valsartan  and recommend monitoring your blood pressure regularly. Avoid activities that may cause dizziness until your blood pressure stabilizes.  -TYPE 2 DIABETES MELLITUS WITH CIRCULATORY COMPLICATIONS: Your diabetes is well-controlled with an improved A1c of 6.9%. Continue taking Ozempic  0.5 mg weekly, Jardiance  25 mg daily, and Lantus  20 units daily. We may consider reducing Lantus  if your A1c remains below 7%. We will check your A1c at your next visit.  -PERSISTENT TACHYCARDIA: Your high heart rate may be due to weight loss, dehydration, or overmedication with thyroid  medicine. We will check your thyroid  function and adjust your levothyroxine  dose if necessary.  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 We will also review the proper injection site and technique with you.  -PRIMARY HYPOTHYROIDISM ON LEVOTHYROXINE : Weight loss may require an adjustment in your thyroid  medicine dose. We will check your thyroid  function to determine if a dose change is needed.  -OBESITY WITH SIGNIFICANT RECENT WEIGHT LOSS: Your 50-pound weight loss has improved your diabetes control and reduced the need for blood pressure medicine. Continue your current lifestyle changes and Ozempic  therapy. We will monitor your weight and adjust medications as needed.  -HYPERLIPIDEMIA ON STATIN  THERAPY: Your cholesterol is being managed with rosuvastatin . Continue taking rosuvastatin  as prescribed.  INSTRUCTIONS: Please monitor your blood pressure regularly and avoid activities that may cause dizziness until your blood pressure stabilizes. We will check your A1c and thyroid  function at your next visit. Schedule a testosterone  level check at the 7-day mark post-injection. Continue your current medications and lifestyle changes, and we will adjust them as needed based on your progress.

## 2023-09-22 NOTE — Assessment & Plan Note (Signed)
 Diabetes is well-controlled with an improved A1c of 6.9%, aided by weight loss. Continue Ozempic  0.5 mg weekly, Jardiance  25 mg daily, and Lantus  20 units daily. Consider reducing Lantus  if A1c remains below 7%. Check A1c at the next visit.

## 2023-09-22 NOTE — Assessment & Plan Note (Signed)
 Testosterone  levels were previously high with weekly dosing, but current levels have not been checked due to timing. Schedule a testosterone  level check at the 7-day mark post-injection and educated on proper injection site and technique.

## 2023-09-22 NOTE — Progress Notes (Signed)
 Established Patient Office Visit  Subjective   Patient ID: MELINDA POTTINGER II, male    DOB: 23-Nov-1975  Age: 48 y.o. MRN: [id number]  Chief Complaint  Patient presents with   Medical Management of Chronic Issues    7 week follow up   Patient did state he was very dizzy and not able to move around being so dizzy. BP 70/54 and hear rate was 120.      HPI  Discussed the use of AI scribe software for clinical note transcription with the patient, who gave verbal consent to proceed.  History of Present Illness SHAHID FLORI II is a 48 year old male with diabetes who presents with persistent low blood pressure and high heart rate.  He experiences persistent low blood pressure and high heart rate, with blood pressure dropping significantly during physical activities like showering, leading to lightheadedness. Measurements have been as low as 70/54 mmHg during these episodes, occurring a couple of times a week. He stopped taking valsartan  due to intolerance, and his pulse has remained above 100 bpm for over a month. He experiences dizziness when standing.  He has lost 50 pounds in the past two months, attributed to the use of Ozempic . He is currently on a 0.5 mg dose of Ozempic , started four weeks ago. He reports changes in sleep patterns, staying awake for two to three days at a time before sleeping. He experiences increased energy levels and reduced joint pain, but his blood pressure issues prevent him from engaging in desired physical activities like walking.  His diabetes management includes Ozempic  0.5 mg weekly, Lantus  20 units, and Jardiance  25 mg. Blood sugar levels have been stable, with readings between 107 and 132 mg/dL. He has improved his A1c from 10.2% to 6.9% over three months. He follows a low-carb diet and limits sweets to once or twice a week.  He is on testosterone  replacement therapy, taking 200 mg every other week. He reports feeling better when his testosterone  levels  were higher, around 1200 ng/dL. He is due for his next injection today.  He is also on rosuvastatin  for cholesterol management and levothyroxine  88 mcg for thyroid  function. His thyroid  function was last checked in March and was normal. He drinks about two bottles of water or Gatorade Zero daily. No vomiting from Ozempic  and normal urination patterns are reported.      Objective:     BP 103/70   Pulse (!) 113   Wt 183 lb (83 kg)   SpO2 99%   BMI 25.52 kg/m    Physical Exam  Gen: Well-appearing man Heart: Regular, no murmur Lungs: Unlabored, clear throughout Ext: Warm, no edema, normal joints   Results for orders placed or performed in visit on 09/22/23  POCT glycosylated hemoglobin (Hb A1C)  Result Value Ref Range   Hemoglobin A1C 6.9 (A) 4.0 - 5.6 %   HbA1c POC (<> result, manual entry)     HbA1c, POC (prediabetic range)     HbA1c, POC (controlled diabetic range)        Assessment & Plan:    Problem List Items Addressed This Visit       High   HTN (hypertension) (Chronic)   Recurrent orthostatic hypotension is likely due to weight loss and overmedication with antihypertensives, as valsartan  reduction was insufficient. Discontinue valsartan  and monitor blood pressure regularly. Advised against activities that may provoke hypotension until stable.      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 Continue Ozempic  0.5 mg weekly, Jardiance  25 mg daily, and Lantus  20 units daily. Consider reducing Lantus  if A1c remains below 7%. Check A1c at the next visit.      Relevant Orders   POCT glycosylated hemoglobin (Hb A1C) (Completed)     Medium    Hypothyroidism (Chronic)   Weight loss may necessitate a levothyroxine  dose adjustment, which could also contribute to tachycardia if overmedicated. Check TSH to assess the need for levothyroxine  dose adjustment.       Relevant Orders   TSH   Primary testicular failure (Chronic)   Testosterone  levels were previously high with weekly dosing, but current levels have not been checked due to timing. Schedule a testosterone  level check at the 7-day mark post-injection and educated on proper injection site and technique.       Return in about 3 months (around 12/23/2023).    Cleatus Debby Specking, MD

## 2023-09-25 ENCOUNTER — Other Ambulatory Visit: Payer: Self-pay | Admitting: Student in an Organized Health Care Education/Training Program

## 2023-09-25 DIAGNOSIS — R7989 Other specified abnormal findings of blood chemistry: Secondary | ICD-10-CM

## 2023-09-26 ENCOUNTER — Telehealth: Payer: Self-pay

## 2023-09-26 DIAGNOSIS — R7989 Other specified abnormal findings of blood chemistry: Secondary | ICD-10-CM

## 2023-09-26 MED ORDER — TESTOSTERONE CYPIONATE 200 MG/ML IM SOLN: 100.0000 mg | INTRAMUSCULAR | 0 refills | Status: DC

## 2023-09-26 NOTE — Addendum Note (Signed)
 Addended by: JERRELL SOLIAN T on: 09/26/2023 12:52 PM   Modules accepted: Orders

## 2023-09-26 NOTE — Telephone Encounter (Signed)
 Sent to PCP ?

## 2023-10-24 ENCOUNTER — Other Ambulatory Visit: Payer: Self-pay | Admitting: Student in an Organized Health Care Education/Training Program

## 2023-10-28 ENCOUNTER — Other Ambulatory Visit: Payer: Self-pay | Admitting: Student in an Organized Health Care Education/Training Program

## 2023-10-28 DIAGNOSIS — R7989 Other specified abnormal findings of blood chemistry: Secondary | ICD-10-CM

## 2023-10-30 NOTE — Telephone Encounter (Signed)
 Requested Prescriptions   Pending Prescriptions Disp Refills   testosterone  cypionate (DEPOTESTOSTERONE CYPIONATE) 200 MG/ML injection [Pharmacy Med Name: Testosterone  Cypionate 200 MG/ML Intramuscular Solution] 2 mL 0    Sig: INJECT 0.5 ML  INTRAMUSCULARLY EVERY 14 DAYS     Date of patient request: 10/30/23 Last office visit: 09/22/2023 Upcoming visit: 12/25/2023 Date of last refill: 09/26/23 Last refill amount: 2mL

## 2023-11-04 ENCOUNTER — Other Ambulatory Visit: Payer: Self-pay | Admitting: Student in an Organized Health Care Education/Training Program

## 2023-11-09 ENCOUNTER — Other Ambulatory Visit: Payer: Self-pay | Admitting: Student in an Organized Health Care Education/Training Program

## 2023-11-09 DIAGNOSIS — E039 Hypothyroidism, unspecified: Secondary | ICD-10-CM

## 2023-11-14 ENCOUNTER — Other Ambulatory Visit: Payer: Self-pay | Admitting: Student in an Organized Health Care Education/Training Program

## 2023-11-14 ENCOUNTER — Telehealth: Payer: Self-pay | Admitting: Student in an Organized Health Care Education/Training Program

## 2023-11-14 DIAGNOSIS — E1159 Type 2 diabetes mellitus with other circulatory complications: Secondary | ICD-10-CM

## 2023-11-14 MED ORDER — DEXCOM G7 RECEIVER DEVI: 1.0000 | 5 refills | Status: DC

## 2023-11-14 MED ORDER — DEXCOM G7 SENSOR MISC: 0 refills | Status: DC

## 2023-11-14 NOTE — Telephone Encounter (Signed)
Refilled. Thank you.

## 2023-11-14 NOTE — Telephone Encounter (Signed)
 Copied from CRM [id number]. Topic: Clinical - Medication Refill >> Nov 14, 2023  3:04 PM Ashley R wrote: Medication: Continuous Glucose Sensor (DEXCOM G7 SENSOR) MISC   Has the patient contacted their pharmacy? Yes - pharmacy is faxing over request today, but running out in 3 hours. Would like to request more than one sent at a time   This is the patient's preferred pharmacy:  [facility] - [place], [place] - [address] [place] [address] Phone: [phone] Fax: [phone]   Is this the correct pharmacy for this prescription? Yes If no, delete pharmacy and type the correct one.   Has the prescription been filled recently? Yes  Is the patient out of the medication? Yes  Has the patient been seen for an appointment in the last year OR does the patient have an upcoming appointment? Yes  Can we respond through MyChart? Yes  Agent: Please be advised that Rx refills may take up to 3 business days. We ask that you follow-up with your pharmacy.

## 2023-11-24 ENCOUNTER — Other Ambulatory Visit: Payer: Self-pay | Admitting: Student in an Organized Health Care Education/Training Program

## 2023-11-24 DIAGNOSIS — E1159 Type 2 diabetes mellitus with other circulatory complications: Secondary | ICD-10-CM

## 2023-12-01 ENCOUNTER — Other Ambulatory Visit: Payer: Self-pay | Admitting: Student in an Organized Health Care Education/Training Program

## 2023-12-01 DIAGNOSIS — E1159 Type 2 diabetes mellitus with other circulatory complications: Secondary | ICD-10-CM

## 2023-12-13 ENCOUNTER — Other Ambulatory Visit: Payer: Self-pay | Admitting: Student in an Organized Health Care Education/Training Program

## 2023-12-13 DIAGNOSIS — E1159 Type 2 diabetes mellitus with other circulatory complications: Secondary | ICD-10-CM

## 2023-12-14 ENCOUNTER — Other Ambulatory Visit: Payer: Self-pay | Admitting: Student in an Organized Health Care Education/Training Program

## 2023-12-14 DIAGNOSIS — E1159 Type 2 diabetes mellitus with other circulatory complications: Secondary | ICD-10-CM

## 2023-12-14 DIAGNOSIS — R7989 Other specified abnormal findings of blood chemistry: Secondary | ICD-10-CM

## 2023-12-14 NOTE — Telephone Encounter (Signed)
 Copied from CRM [id number]. Topic: Clinical - Medication Refill >> Dec 14, 2023  2:16 PM Suzen RAMAN wrote: Medication: testosterone  cypionate (DEPOTESTOSTERONE CYPIONATE) 200 MG/ML injection Continuous Glucose Sensor  Has the patient contacted their pharmacy? Yes   This is the patient's preferred pharmacy:  [facility] - [place], [place] - [address] [place] [address] Phone: [phone] Fax: [phone]  Is this the correct pharmacy for this prescription? Yes If no, delete pharmacy and type the correct one.   Has the prescription been filled recently? No  Is the patient out of the medication? Yes  Has the patient been seen for an appointment in the last year OR does the patient have an upcoming appointment? Yes  Can we respond through MyChart? Yes  Agent: Please be advised that Rx refills may take up to 3 business days. We ask that you follow-up with your pharmacy.

## 2023-12-14 NOTE — Telephone Encounter (Signed)
 Requested Prescriptions   Pending Prescriptions Disp Refills   testosterone  cypionate (DEPOTESTOSTERONE CYPIONATE) 200 MG/ML injection 2 mL 2    Sig: Inject 0.5 mLs (100 mg total) into the muscle every 14 (fourteen) days.   Continuous Glucose Sensor (DEXCOM G7 SENSOR) MISC 1 each 0    Sig: PLACE SENSOR ON THE ARM, REPLACE EVERY 10 DAYS     Date of patient request: 11.20.25 Last office visit: 09/22/2023 Upcoming visit: 12/25/2023

## 2023-12-15 MED ORDER — DEXCOM G7 SENSOR MISC: 0 refills | Status: DC

## 2023-12-15 MED ORDER — TESTOSTERONE CYPIONATE 200 MG/ML IM SOLN: 100.0000 mg | INTRAMUSCULAR | 2 refills | Status: AC

## 2023-12-25 ENCOUNTER — Encounter: Payer: Self-pay | Admitting: Student in an Organized Health Care Education/Training Program

## 2023-12-25 ENCOUNTER — Ambulatory Visit: Admitting: Student in an Organized Health Care Education/Training Program

## 2023-12-25 VITALS — BP 132/86 | HR 103 | Wt 179.0 lb

## 2023-12-25 DIAGNOSIS — E782 Mixed hyperlipidemia: Secondary | ICD-10-CM

## 2023-12-25 DIAGNOSIS — Z7984 Long term (current) use of oral hypoglycemic drugs: Secondary | ICD-10-CM

## 2023-12-25 DIAGNOSIS — I10 Essential (primary) hypertension: Secondary | ICD-10-CM

## 2023-12-25 DIAGNOSIS — E1159 Type 2 diabetes mellitus with other circulatory complications: Secondary | ICD-10-CM

## 2023-12-25 DIAGNOSIS — Z7985 Long-term (current) use of injectable non-insulin antidiabetic drugs: Secondary | ICD-10-CM

## 2023-12-25 DIAGNOSIS — Z23 Encounter for immunization: Secondary | ICD-10-CM | POA: Diagnosis not present

## 2023-12-25 DIAGNOSIS — K603 Anal fistula, unspecified: Secondary | ICD-10-CM

## 2023-12-25 DIAGNOSIS — E291 Testicular hypofunction: Secondary | ICD-10-CM | POA: Diagnosis not present

## 2023-12-25 LAB — LIPID PANEL
Cholesterol: 121 mg/dL (ref 0–200)
HDL: 34.7 mg/dL — ABNORMAL LOW (ref >39.00)
LDL Cholesterol: 59 mg/dL (ref 0–99)
NonHDL: 86.22
Total CHOL/HDL Ratio: 3
Triglycerides: 137 mg/dL (ref 0.0–149.0)
VLDL: 27.4 mg/dL (ref 0.0–40.0)

## 2023-12-25 LAB — CBC
HCT: 47.5 % (ref 39.0–52.0)
Hemoglobin: 16.6 g/dL (ref 13.0–17.0)
MCHC: 34.9 g/dL (ref 30.0–36.0)
MCV: 87.4 fl (ref 78.0–100.0)
Platelets: 261 K/uL (ref 150.0–400.0)
RBC: 5.43 Mil/uL (ref 4.22–5.81)
RDW: 13.1 % (ref 11.5–15.5)
WBC: 5.5 K/uL (ref 4.0–10.5)

## 2023-12-25 LAB — BASIC METABOLIC PANEL WITH GFR
BUN: 11 mg/dL (ref 6–23)
CO2: 34 meq/L — ABNORMAL HIGH (ref 19–32)
Calcium: 10.1 mg/dL (ref 8.4–10.5)
Chloride: 100 meq/L (ref 96–112)
Creatinine, Ser: 0.9 mg/dL (ref 0.40–1.50)
GFR: 102.07 mL/min (ref >60.00)
Glucose, Bld: 144 mg/dL — ABNORMAL HIGH (ref 70–99)
Potassium: 3.8 meq/L (ref 3.5–5.1)
Sodium: 141 meq/L (ref 135–145)

## 2023-12-25 LAB — MICROALBUMIN / CREATININE URINE RATIO
Creatinine,U: 69.8 mg/dL
Microalb Creat Ratio: 40.6 mg/g — ABNORMAL HIGH (ref 0.0–30.0)
Microalb, Ur: 2.8 mg/dL — ABNORMAL HIGH (ref 0.0–1.9)

## 2023-12-25 LAB — TESTOSTERONE: Testosterone: 765.75 ng/dL (ref 300.00–890.00)

## 2023-12-25 LAB — HEMOGLOBIN A1C: Hgb A1c MFr Bld: 6.3 % (ref 4.6–6.5)

## 2023-12-25 MED ORDER — DEXCOM G7 SENSOR MISC: 5 refills | Status: AC

## 2023-12-25 NOTE — Progress Notes (Signed)
 Established Patient Office Visit  Patient ID: Nathan [name], male    DOB: [birth year]  Age: 48 y.o. MRN: [id number] PCP: Jerrell Cleatus Ned, MD  Chief Complaint  Patient presents with   Diabetes    3 month follow up   Patient interested in PCV and Tdap Vaccine today I have requested Patient's eye exam multiple times     Subjective:     HPI  Discussed the use of AI scribe software for clinical note transcription with the patient, who gave verbal consent to proceed.  History of Present Illness Nathan [name] is a 48 year old male with diabetes who presents for diabetes management and medication review.  He manages his diabetes with a Dexcom continuous glucose monitor, which he finds helpful. His glucose readings show 75% in range and 23% high, with no recorded lows. He is currently using Ozempic  0.5 mg weekly, Lantus  15 units daily, and Jardiance  25 mg daily. No issues with his medications and no recent episodes of lightheadedness or syncope, except for one instance while airing up tires.  He reports a recent weight loss of four pounds over the past three months, although he gained three pounds over the Thanksgiving holiday and his birthday.  He is receiving testosterone  therapy, with a recent dose last week. He uses a vial containing 200 mg/mL for his testosterone  injections, but there has been some confusion about the exact dosage and frequency. He feels well when his testosterone  levels were higher, around 1200, but acknowledges this was shortly after a dose.  He has a history of surgery on his tailbone and reports a small, persistent opening at the surgical site, which was previously almost closed. He has been monitoring it and notes it is about the size of his fingernail.  No recent lightheadedness or syncope, except for one episode while airing up tires. No side effects from his current medications.     Objective:     BP 132/86   Pulse (!) 103   Wt 179  lb (81.2 kg)   BMI 24.97 kg/m   Physical Exam  Gen: Well-appearing man Heart: Regular, no murmur Lungs: Unlabored, clear throughout Skin: On his backside near the sacrum there is a small 1 cm wound/open fistula, no active drainage, minimal erythema around it, no purulence. Ext: Warm, no edema    Assessment & Plan:   Problem List Items Addressed This Visit       High   HTN (hypertension) (Chronic)   Blood pressure well-controlled today off medications.  Check microalbumin and renal function.  No further issues with orthostatic hypotension.  If we start to see microalbumin in his urine or increases in his blood pressure, would start back with a low-dose ARB.      Relevant Orders   Basic metabolic panel with GFR   Microalbumin / creatinine urine ratio   Type 2 diabetes mellitus with vascular disease (HCC) - Primary (Chronic)   Blood glucose is well-controlled with 75% in range and 23% high, with no hypoglycemia. A1c was 6.9% in July, close to target per Dexcom. Refilled Dexcom sensors and ordered blood work for A1c and other parameters. Continue Ozempic , Lantus  15, and Jardiance .      Relevant Medications   Continuous Glucose Sensor (DEXCOM G7 SENSOR) MISC   Other Relevant Orders   Basic metabolic panel with GFR   Hemoglobin A1c   Microalbumin / creatinine urine ratio   Perianal fistula (Chronic)   The wound is about  1cm, not infected, but remains open. Maintaining blood glucose control is important to promote healing.  This is being comanaged with general surgery.        Medium    Primary testicular failure (Chronic)   Testosterone  therapy is ongoing, but there is dose uncertainty due to potential overuse. Previous levels were high, indicating possible over-dosing, I think he is probably using 1 mL rather than 0.5. Ordered blood work for testosterone  levels and blood counts. Educated on proper dosing technique. Adjust dose based on results.      Relevant Orders   CBC    Testosterone    Mixed hyperlipidemia (Chronic)   Relevant Orders   Lipid panel   Other Visit Diagnoses       Needs flu shot         Need for pneumococcal 20-valent conjugate vaccination       Relevant Orders   Pneumococcal conjugate vaccine 20-valent (Prevnar 20) (Completed)     Need for Tdap vaccination       Relevant Orders   Tdap vaccine greater than or equal to 7yo IM (Completed)      Return in about 3 months (around 03/24/2024) for Diabetes management.    Cleatus Debby Specking, MD [facility]

## 2023-12-25 NOTE — Assessment & Plan Note (Signed)
 Testosterone  therapy is ongoing, but there is dose uncertainty due to potential overuse. Previous levels were high, indicating possible over-dosing, I think he is probably using 1 mL rather than 0.5. Ordered blood work for testosterone  levels and blood counts. Educated on proper dosing technique. Adjust dose based on results.

## 2023-12-25 NOTE — Patient Instructions (Signed)
  VISIT SUMMARY: During today's visit, we reviewed your diabetes management, discussed your testosterone  therapy, and examined your sacrococcygeal wound. Your blood glucose levels are well-controlled, and we refilled your Dexcom sensors. We also addressed the confusion regarding your testosterone  dosage and ordered blood work to monitor your levels. Additionally, we discussed the persistent wound at your surgical site and the importance of maintaining good blood glucose control to aid in healing.  YOUR PLAN: -TYPE 2 DIABETES MELLITUS WITH VASCULAR DISEASE: Type 2 diabetes is a condition where your body does not use insulin  properly, leading to high blood sugar levels. Your blood glucose is well-controlled with 75% in range and 23% high, and your A1c was 6.9% in July. We have refilled your Dexcom sensors and ordered blood work for A1c and other parameters. Please continue taking Ozempic , Lantus , and Jardiance  as prescribed.  -CHRONIC NON-HEALING SACROCOCCYGEAL WOUND: This is a wound at your tailbone area that has not healed completely. It is about the size of your fingernail and not infected. Keeping your blood glucose under control is crucial for healing. If there is no improvement, we may consider a surgical referral.  -PRIMARY TESTICULAR FAILURE: Primary testicular failure is when your testicles do not produce enough testosterone . You are currently on testosterone  therapy, but there has been some confusion about the dosage. We have ordered blood work to check your testosterone  levels and blood counts. We also provided education on proper dosing techniques and will adjust your dose based on the results.  INSTRUCTIONS: Please follow up with the blood work we have ordered to monitor your A1c and testosterone  levels. Continue taking your diabetes medications as prescribed and monitor your blood glucose levels. If your sacrococcygeal wound does not improve, we may need to refer you for surgical evaluation.  Make sure to use the correct dosage for your testosterone  therapy as instructed.

## 2023-12-25 NOTE — Assessment & Plan Note (Signed)
 Blood glucose is well-controlled with 75% in range and 23% high, with no hypoglycemia. A1c was 6.9% in July, close to target per Dexcom. Refilled Dexcom sensors and ordered blood work for A1c and other parameters. Continue Ozempic , Lantus  15, and Jardiance .

## 2023-12-25 NOTE — Assessment & Plan Note (Addendum)
 Blood pressure well-controlled today off medications.  Check microalbumin and renal function.  No further issues with orthostatic hypotension.  If we start to see microalbumin in his urine or increases in his blood pressure, would start back with a low-dose ARB.

## 2023-12-25 NOTE — Assessment & Plan Note (Signed)
 The wound is about 1cm, not infected, but remains open. Maintaining blood glucose control is important to promote healing.  This is being comanaged with general surgery.

## 2023-12-26 ENCOUNTER — Ambulatory Visit: Payer: Self-pay | Admitting: Student in an Organized Health Care Education/Training Program

## 2024-01-08 ENCOUNTER — Other Ambulatory Visit: Payer: Self-pay | Admitting: Student in an Organized Health Care Education/Training Program

## 2024-01-08 DIAGNOSIS — E1159 Type 2 diabetes mellitus with other circulatory complications: Secondary | ICD-10-CM

## 2024-01-24 ENCOUNTER — Other Ambulatory Visit: Payer: Self-pay | Admitting: Student in an Organized Health Care Education/Training Program

## 2024-01-24 DIAGNOSIS — E039 Hypothyroidism, unspecified: Secondary | ICD-10-CM

## 2024-02-21 ENCOUNTER — Other Ambulatory Visit: Payer: Self-pay | Admitting: Student in an Organized Health Care Education/Training Program

## 2024-02-29 ENCOUNTER — Other Ambulatory Visit: Payer: Self-pay | Admitting: Student in an Organized Health Care Education/Training Program

## 2024-02-29 DIAGNOSIS — E1159 Type 2 diabetes mellitus with other circulatory complications: Secondary | ICD-10-CM

## 2024-03-25 ENCOUNTER — Ambulatory Visit: Admitting: Student in an Organized Health Care Education/Training Program
